# Patient Record
Sex: Female | Born: 1940 | Race: White | Hispanic: No | Marital: Married | Smoking: Never smoker
Health system: Southern US, Community
[De-identification: ages and names within clinical notes are randomized; demographics above are authoritative.]

## PROBLEM LIST (undated history)

## (undated) DIAGNOSIS — C4490 Unspecified malignant neoplasm of skin, unspecified: Secondary | ICD-10-CM

## (undated) DIAGNOSIS — H3530 Unspecified macular degeneration: Secondary | ICD-10-CM

## (undated) DIAGNOSIS — I10 Essential (primary) hypertension: Secondary | ICD-10-CM

## (undated) DIAGNOSIS — M1990 Unspecified osteoarthritis, unspecified site: Secondary | ICD-10-CM

## (undated) HISTORY — PX: ABDOMINAL HYSTERECTOMY: SHX81

## (undated) HISTORY — PX: COLONOSCOPY: SHX174

## (undated) HISTORY — PX: WISDOM TOOTH EXTRACTION: SHX21

## (undated) HISTORY — PX: BREAST BIOPSY: SHX20

---

## 2004-07-24 ENCOUNTER — Ambulatory Visit: Payer: Self-pay | Admitting: Unknown Physician Specialty

## 2006-04-08 ENCOUNTER — Ambulatory Visit: Payer: Self-pay | Admitting: Unknown Physician Specialty

## 2013-09-14 ENCOUNTER — Ambulatory Visit: Payer: Self-pay | Admitting: Unknown Physician Specialty

## 2013-09-17 LAB — PATHOLOGY REPORT

## 2014-07-23 ENCOUNTER — Encounter: Payer: Self-pay | Admitting: *Deleted

## 2014-07-30 ENCOUNTER — Ambulatory Visit: Payer: Medicare PPO | Admitting: Anesthesiology

## 2014-07-30 ENCOUNTER — Encounter
Admission: RE | Disposition: A | Discharge status: Home / Self Care | Payer: Self-pay | Source: Ambulatory Visit | Attending: Ophthalmology

## 2014-07-30 ENCOUNTER — Ambulatory Visit
Admission: RE | Admit: 2014-07-30 | Discharge: 2014-07-30 | Disposition: A | Discharge status: Home / Self Care | Payer: Medicare PPO | Source: Ambulatory Visit | Attending: Ophthalmology | Admitting: Ophthalmology

## 2014-07-30 DIAGNOSIS — H02832 Dermatochalasis of right lower eyelid: Secondary | ICD-10-CM | POA: Insufficient documentation

## 2014-07-30 DIAGNOSIS — Z85828 Personal history of other malignant neoplasm of skin: Secondary | ICD-10-CM | POA: Diagnosis not present

## 2014-07-30 DIAGNOSIS — I10 Essential (primary) hypertension: Secondary | ICD-10-CM | POA: Insufficient documentation

## 2014-07-30 DIAGNOSIS — H02834 Dermatochalasis of left upper eyelid: Secondary | ICD-10-CM | POA: Diagnosis not present

## 2014-07-30 DIAGNOSIS — Z90710 Acquired absence of both cervix and uterus: Secondary | ICD-10-CM | POA: Diagnosis not present

## 2014-07-30 DIAGNOSIS — H02831 Dermatochalasis of right upper eyelid: Secondary | ICD-10-CM | POA: Insufficient documentation

## 2014-07-30 DIAGNOSIS — Z885 Allergy status to narcotic agent status: Secondary | ICD-10-CM | POA: Diagnosis not present

## 2014-07-30 DIAGNOSIS — H02835 Dermatochalasis of left lower eyelid: Secondary | ICD-10-CM | POA: Insufficient documentation

## 2014-07-30 DIAGNOSIS — H018 Other specified inflammations of eyelid: Secondary | ICD-10-CM | POA: Insufficient documentation

## 2014-07-30 HISTORY — DX: Unspecified osteoarthritis, unspecified site: M19.90

## 2014-07-30 HISTORY — DX: Unspecified malignant neoplasm of skin, unspecified: C44.90

## 2014-07-30 HISTORY — DX: Essential (primary) hypertension: I10

## 2014-07-30 HISTORY — PX: BROW LIFT: SHX178

## 2014-07-30 HISTORY — DX: Unspecified macular degeneration: H35.30

## 2014-07-30 SURGERY — BLEPHAROPLASTY
Anesthesia: Monitor Anesthesia Care | Site: Eye | Laterality: Bilateral | Wound class: Clean

## 2014-07-30 MED ORDER — BACITRACIN 500 UNIT/GM OP OINT: TOPICAL_OINTMENT | OPHTHALMIC | Status: DC

## 2014-07-30 MED ORDER — ERYTHROMYCIN 5 MG/GM OP OINT
TOPICAL_OINTMENT | OPHTHALMIC | Status: DC | PRN
  Administered 2014-07-30: 1 via OPHTHALMIC

## 2014-07-30 MED ORDER — BSS IO SOLN
INTRAOCULAR | Status: DC | PRN
  Administered 2014-07-30: 15 mL via INTRAOCULAR

## 2014-07-30 MED ORDER — POVIDONE-IODINE 10 % EX OINT
TOPICAL_OINTMENT | CUTANEOUS | Status: DC | PRN
  Administered 2014-07-30: 1 via TOPICAL

## 2014-07-30 MED ORDER — TETRACAINE HCL 0.5 % OP SOLN
OPHTHALMIC | Status: DC | PRN
  Administered 2014-07-30: 2 [drp] via OPHTHALMIC

## 2014-07-30 MED ORDER — ACETAMINOPHEN 160 MG/5ML PO SOLN: 325.0000 mg | ORAL | Status: DC | PRN

## 2014-07-30 MED ORDER — LACTATED RINGERS IV SOLN
INTRAVENOUS | Status: DC
  Administered 2014-07-30 (×2): via INTRAVENOUS

## 2014-07-30 MED ORDER — PROPOFOL INFUSION 10 MG/ML OPTIME
INTRAVENOUS | Status: DC | PRN
  Administered 2014-07-30: 25 ug/kg/min via INTRAVENOUS

## 2014-07-30 MED ORDER — STERILE WATER FOR IRRIGATION IR SOLN
Status: DC | PRN
  Administered 2014-07-30: 1

## 2014-07-30 MED ORDER — ACETAMINOPHEN 325 MG PO TABS: 325.0000 mg | ORAL_TABLET | ORAL | Status: DC | PRN

## 2014-07-30 MED ORDER — NEOMYCIN-POLYMYXIN-DEXAMETH 3.5-10000-0.1 OP SUSP: OPHTHALMIC | Status: DC

## 2014-07-30 MED ORDER — ALFENTANIL 500 MCG/ML IJ INJ
INJECTION | INTRAMUSCULAR | Status: DC | PRN
  Administered 2014-07-30 (×2): 500 ug via INTRAVENOUS

## 2014-07-30 MED ORDER — OXYCODONE-ACETAMINOPHEN 5-325 MG PO TABS: 1.0000 | ORAL_TABLET | ORAL | Status: DC | PRN

## 2014-07-30 MED ORDER — LIDOCAINE-EPINEPHRINE 2 %-1:100000 IJ SOLN
INTRAMUSCULAR | Status: DC | PRN
  Administered 2014-07-30: 7.5 mL via OPHTHALMIC
  Administered 2014-07-30: 2 mL via OPHTHALMIC

## 2014-07-30 MED ORDER — MIDAZOLAM HCL 2 MG/2ML IJ SOLN
INTRAMUSCULAR | Status: DC | PRN
  Administered 2014-07-30: 2 mg via INTRAVENOUS

## 2014-07-30 SURGICAL SUPPLY — 34 items
BLADE SURG 15 STRL LF DISP TIS (BLADE) ×1 IMPLANT
BLADE SURG 15 STRL SS (BLADE) ×2
CORD BIP STRL DISP 12FT (MISCELLANEOUS) ×3 IMPLANT
DRAPE HEAD BAR (DRAPES) ×3 IMPLANT
GAUZE SPONGE 4X4 12PLY STRL (GAUZE/BANDAGES/DRESSINGS) ×3 IMPLANT
GAUZE SPONGE NON-WVN 2X2 STRL (MISCELLANEOUS) ×10 IMPLANT
GLOVE SURG LX 7.0 MICRO (GLOVE) ×4
GLOVE SURG LX STRL 7.0 MICRO (GLOVE) ×2 IMPLANT
MARKER SKIN XFINE TIP W/RULER (MISCELLANEOUS) ×3 IMPLANT
NEEDLE FILTER BLUNT 18X 1/2SAF (NEEDLE) ×2
NEEDLE FILTER BLUNT 18X1 1/2 (NEEDLE) ×1 IMPLANT
NEEDLE HYPO 30X.5 LL (NEEDLE) ×6 IMPLANT
PACK DRAPE NASAL/ENT (PACKS) ×3 IMPLANT
SOL PREP PVP 2OZ (MISCELLANEOUS) ×3
SOLUTION PREP PVP 2OZ (MISCELLANEOUS) ×1 IMPLANT
SPONGE VERSALON 2X2 STRL (MISCELLANEOUS) ×20
SUT CHROMIC 4-0 (SUTURE)
SUT CHROMIC 4-0 M2 12X2 ARM (SUTURE)
SUT CHROMIC 5 0 P 3 (SUTURE) IMPLANT
SUT ETHILON 4 0 CL P 3 (SUTURE) IMPLANT
SUT MERSILENE 4-0 S-2 (SUTURE) ×3 IMPLANT
SUT PLAIN GUT (SUTURE) ×12 IMPLANT
SUT PROLENE 5 0 P 3 (SUTURE) IMPLANT
SUT PROLENE 6 0 P 1 18 (SUTURE) IMPLANT
SUT SILK 4 0 G 3 (SUTURE) ×3 IMPLANT
SUT VIC AB 5-0 P-3 18X BRD (SUTURE) IMPLANT
SUT VIC AB 5-0 P3 18 (SUTURE)
SUT VICRYL 6-0  S14 CTD (SUTURE)
SUT VICRYL 6-0 S14 CTD (SUTURE) IMPLANT
SUT VICRYL 7 0 TG140 8 (SUTURE) IMPLANT
SUTURE CHRMC 4-0 M2 12X2 ARM (SUTURE) IMPLANT
SYR 3ML LL SCALE MARK (SYRINGE) ×3 IMPLANT
SYRINGE 10CC LL (SYRINGE) ×3 IMPLANT
WATER STERILE IRR 500ML POUR (IV SOLUTION) ×3 IMPLANT

## 2014-07-30 NOTE — Discharge Instructions (Signed)
General Anesthesia, Care After Refer to this sheet in the next few weeks. These instructions provide you with information on caring for yourself after your procedure. Your health care provider may also give you more specific instructions. Your treatment has been planned according to current medical practices, but problems sometimes occur. Call your health care provider if you have any problems or questions after your procedure. WHAT TO EXPECT AFTER THE PROCEDURE After the procedure, it is typical to experience:  Sleepiness.  Nausea and vomiting. HOME CARE INSTRUCTIONS  For the first 24 hours after general anesthesia:  Have a responsible person with you.  Do not drive a car. If you are alone, do not take public transportation.  Do not drink alcohol.  Do not take medicine that has not been prescribed by your health care provider.  Do not sign important papers or make important decisions.  You may resume a normal diet and activities as directed by your health care provider.  Change bandages (dressings) as directed.  If you have questions or problems that seem related to general anesthesia, call the hospital and ask for the anesthetist or anesthesiologist on call. SEEK MEDICAL CARE IF:  You have nausea and vomiting that continue the day after anesthesia.  You develop a rash. SEEK IMMEDIATE MEDICAL CARE IF:   You have difficulty breathing.  You have chest pain.  You have any allergic problems. Document Released: 05/17/2000 Document Revised: 02/13/2013 Document Reviewed: 08/24/2012 [facility] Patient Information 2015 [place], [place]. This information is not intended to replace advice given to you by your health care provider. Make sure you discuss any questions you have with your health care provider. INSTRUCTIONS FOLLOWING [facility] AMY Dennie Maizes, MD  AFTER YOUR EYE SURGERY, THER ARE MANY THINGS [place] YOU, THE PATIENT, CAN DO TO ASSURE THE BEST POSSIBLE RESULT FROM  YOUR OPERATION.  THIS SHEET SHOULD BE REFERRED TO WHENEVER QUESTIONS ARISE.  IF THERE ARE ANY QUESTIONS NOT ANSWERED HERE, DO NOT HESITATE TO CALL OUR OFFICE AT [phone] OR [phone].  THERE IS ALWAYS [facility] AVAILABLE TO CALL IF QUESTIONS OR PROBLEMS ARISE.  VISION: Your vision may be blurred and out of focus after surgery until you are able to stop using your ointment, swelling resolves and your eye(s) heal. This may take 1 to 2 weeks at the least.  If your vision becomes gradually more dim or dark, this is not normal and you need to call our office immediately.  EYE CARE: For the first 48 hours after surgery, use ice packs frequently - 20 minutes on, 20 minutes off - to help reduce swelling and bruising.  Small bags of frozen peas or corn make good ice packs along with cloths soaked in ice water.  If you are wearing a patch or other type of dressing following surgery, keep this on for the amount of time specified by your doctor.  For the first week following surgery, you will need to treat your stitches with great care.  If is OK to shower, but take care to not allow soapy water to run into your eye(s) to help reduce changes of infection.  You may gently clean the eyelashes and around the eye(s) with cotton balls and sterile water, BUT DO NOT RUB THE STITCHES VIGOROUSLY.  Keeping your stitches moist with ointment will help promote healing with minimal scar formation.  ACTIVITY: When you leave the surgery center, you should go home, rest and be inactive.  The eye(s) may feel scratchy and keeping the eyes  closed will allow for faster healing.  The first week following surgery, avoid straining (anything making the face turn red) or lifting over 20 pounds.  Additionally, avoid bending which causes your head to go below your waist.  Using your eyes will NOT harm them, so feel free to read, watch television, use the computer, etc as desired.  Driving depends on each individual, so check with your  doctor if you have questions about driving.  MEDICATIONS:  You will be given a prescription for an ointment to use 4 times a day on your stitches.  You can use the ointment in your eyes if they feel scratchy or irritated.  If you eyelid(s) dont close completely when you sleep, put some ointment in your eyes before bedtime.  EMERGENCY: If you experience SEVERE EYE PAIN OR HEADACHE UNRELIEVED BY TYLENOL OR PERCOCET, NAUSEA OR VOMITING, WORSENING REDNESS, OR WORSENING VISION (ESPECIALLY VISION THAT WA INITIALLY BETTER) CALL [phone] OR [phone] DURING BUSINESS HOURS OR AFTER HOURS.

## 2014-07-30 NOTE — H&P (Signed)
See printed H&P

## 2014-07-30 NOTE — Transfer of Care (Signed)
Immediate Anesthesia Transfer of Care Note  Patient: Heidi Villegas  Procedure(s) Performed: Procedure(s) with comments: BLEPHAROPLASTY (Bilateral) - COSMETIC BILATERAL UPPER AND LOWER LIDS  Patient Location: PACU  Anesthesia Type: MAC  Level of Consciousness: awake, alert  and patient cooperative  Airway and Oxygen Therapy: Patient Spontanous Breathing and Patient connected to supplemental oxygen  Post-op Assessment: Post-op Vital signs reviewed, Patient's Cardiovascular Status Stable, Respiratory Function Stable, Patent Airway and No signs of Nausea or vomiting  Post-op Vital Signs: Reviewed and stable  Complications: No apparent anesthesia complications

## 2014-07-30 NOTE — Interval H&P Note (Signed)
History and Physical Interval Note:  07/30/2014 7:46 AM  Heidi Villegas  has presented today for surgery, with the diagnosis of 701.8 2.831 2.834  The various methods of treatment have been discussed with the patient and family. After consideration of risks, benefits and other options for treatment, the patient has consented to  Procedure(s) with comments: BLEPHAROPLASTY (Bilateral) - COSMETIC as a surgical intervention .  The patient's history has been reviewed, patient examined, no change in status, stable for surgery.  I have reviewed the patient's chart and labs.  Questions were answered to the patient's satisfaction.     Vickki Muff, Amy M

## 2014-07-30 NOTE — Anesthesia Preprocedure Evaluation (Signed)
Anesthesia Evaluation  Patient identified by MRN, date of birth, ID band  Reviewed: Allergy & Precautions, H&P , NPO status , Patient's Chart, lab work & pertinent test results  Airway Mallampati: II  TM Distance: >3 FB Neck ROM: full    Dental no notable dental hx.    Pulmonary    Pulmonary exam normal       Cardiovascular hypertension, Rhythm:regular Rate:Normal     Neuro/Psych    GI/Hepatic   Endo/Other    Renal/GU      Musculoskeletal   Abdominal   Peds  Hematology   Anesthesia Other Findings   Reproductive/Obstetrics                             Anesthesia Physical Anesthesia Plan  ASA: II  Anesthesia Plan: MAC   Post-op Pain Management:    Induction:   Airway Management Planned:   Additional Equipment:   Intra-op Plan:   Post-operative Plan:   Informed Consent: I have reviewed the patients History and Physical, chart, labs and discussed the procedure including the risks, benefits and alternatives for the proposed anesthesia with the patient or authorized representative who has indicated his/her understanding and acceptance.     Plan Discussed with: CRNA  Anesthesia Plan Comments:         Anesthesia Quick Evaluation

## 2014-07-30 NOTE — Op Note (Signed)
Preoperative Diagnosis:  1. Visually significant dermatochalasis both Upper Eyelid(s) 2.  Dermatochalasis and Steatoblephron both Lower Eyelid(s)  Postoperative Diagnosis:  Same.  Procedure(s) Performed:   1. Upper eyelid blepharoplasty with excess skin excision  both Upper Eyelid(s) 2. Cosmetic trans-conjunctival lower eyelid blepharoplasty both  Lower Eyelid(s)  Teaching Surgeon: Philis Pique. Vickki Muff, M.D.  Assistants: none  Anesthesia: IVA  Specimens: None.  Estimated Blood Loss: Minimal.  Complications: None.  Operative Findings: None Dictated  Procedure:   After the risks, benefits, complications and alternatives were discussed with the patient, appropriate informed consent was obtained and the patient was brought to the operating suite. The patient was reclined supine and a timeout was conducted.  Local anesthetic consisting of a 50-50 mixture of 2% lidocaine with epinephrine and 0.75% bupivacaine with Hylenex added was injected subcutaneously to both upper eyelid(s). Additional anesthetic was injected subconjunctivally to both lower lids. After adequate local was instilled, the patient was prepped and draped in the usual sterile fashion for eyelid surgery.   Attention was turned to the upper eyelids. A 9 mm upper eyelid crease incision line was marked with calipers on both upper eyelid(s).  A pinch test was used to estimate the amount of excess skin to remove and this was marked in standard blepharoplasty style fashion. Attention was turned to the  right upper eyelid. A #15 blade was used to open the premarked incision line. A skin and muscle flap was excised and hemostasis was obtained with bipolar cautery.   A buttonhole was created medially and the medial and a portion of the central fat pockets were dissected free from septal attachments, allowed to prolapse anteriorly, cauterized towards the pedicle base and excised. A small portion of the sub-brow fat pocket was also excised.  This provided nice flattening of the eyelid contour  Attention was then turned to the opposite eyelid where the same procedure was performed in the same manner. Hemostasis was obtained with bipolar cautery throughout. All incisions were then closed with a combination of running and interrupted 6-0 fast absorbing plain suture.   Attention was turned to the left lower eyelid.  The lid was placed on traction over a desmarres retractor with a 4-0 silk frost suture.  A #15 blade was used to open a trans-conjunctival incision along the inferior margin of the tarsus.  Westcott scissors were used to transect through retractors to reach the plane between orbicularis and orbital septum.  Blunt dissection was carried inferiorly in this plane. Stephens scissors were used to bluntly dissect between orbicularis and orbital septum down to the inferior orbital rim.  The orbital septum was opened and the medial, central, and lateral fat pockets were dissected free from fascial attachments, allowed to prolapse anteriorly, cauterized towards the pedicle base and excised. Because of the previous surgery she had on the left lower eyelid, the lid was very tight and the visualization was limited. Once nice flattening of the lid contour was achieved,  Attention was then turned to the opposite lid where the same procedure was performed in the same fashion.  Following the transconjunctival blepharoplasty portion on the right lower lid, a #15 blade was used to make a stab incision perpendicular to the right lateral canthus down to the lateral orbital rim. Blunt dissection was carried out over the lateral orbital rim. A double armed 4-0 Mersilene suture was then passed into the terminal portion of the inferior arm of the lateral canthal tendon. Each arm of the Mersilene was then passed through the  periosteum of the inner portion of the lateral orbital rim at the level of Whitnall's tubercle to provide elevation and tightening of the right  lower lid. Once the suture was secured the stab incision was closed with interrupted 6-0 fast-absorbing plain gut suture.  The patient tolerated the procedure well.  Bacitracin ointment was applied to Her incision sites, followed by ice packs. She was taken to the recovery area where she recovered without difficulty.  Post-Op Plan/Instructions:  The patient was instructed to use ice packs frequently for the next 48 hours. She was instructed to use  bacitracin ointment on her incisions 4 times a day for the next 12 to 14 days. She was given a prescription for Percocet for pain control should Tylenol not be effective. She was asked to to follow up in 2 weeks' time or sooner as needed for problems.  Teaching Surgeon Attestation: none  Amy M. Vickki Muff, M.D. Attending,Ophthalmology

## 2014-07-30 NOTE — Anesthesia Procedure Notes (Signed)
Procedure Name: MAC Date/Time: 07/30/2014 7:53 AM Performed by: Cameron Ali Pre-anesthesia Checklist: Patient identified, Emergency Drugs available, Suction available, Timeout performed and Patient being monitored Patient Re-evaluated:Patient Re-evaluated prior to inductionOxygen Delivery Method: Nasal cannula Placement Confirmation: positive ETCO2

## 2014-07-30 NOTE — Anesthesia Postprocedure Evaluation (Signed)
  Anesthesia Post-op Note  Patient: Heidi Villegas  Procedure(s) Performed: Procedure(s) with comments: BLEPHAROPLASTY (Bilateral) - COSMETIC BILATERAL UPPER AND LOWER LIDS  Anesthesia type:MAC  Patient location: PACU  Post pain: Pain level controlled  Post assessment: Post-op Vital signs reviewed, Patient's Cardiovascular Status Stable, Respiratory Function Stable, Patent Airway and No signs of Nausea or vomiting  Post vital signs: Reviewed and stable  Last Vitals:  Filed Vitals:   07/30/14 1014  BP: 158/92  Pulse: 91  Temp:   Resp: 19    Level of consciousness: awake, alert  and patient cooperative  Complications: No apparent anesthesia complications

## 2014-07-31 ENCOUNTER — Encounter: Payer: Self-pay | Admitting: Ophthalmology

## 2016-01-15 ENCOUNTER — Inpatient Hospital Stay
Admission: EM | Admit: 2016-01-15 | Discharge: 2016-02-23 | DRG: 870 | Disposition: E | Payer: PPO | Attending: Internal Medicine | Admitting: Internal Medicine

## 2016-01-15 ENCOUNTER — Emergency Department: Payer: PPO

## 2016-01-15 ENCOUNTER — Inpatient Hospital Stay: Payer: PPO

## 2016-01-15 DIAGNOSIS — Z882 Allergy status to sulfonamides status: Secondary | ICD-10-CM

## 2016-01-15 DIAGNOSIS — N1330 Unspecified hydronephrosis: Secondary | ICD-10-CM

## 2016-01-15 DIAGNOSIS — D696 Thrombocytopenia, unspecified: Secondary | ICD-10-CM | POA: Diagnosis not present

## 2016-01-15 DIAGNOSIS — K922 Gastrointestinal hemorrhage, unspecified: Secondary | ICD-10-CM

## 2016-01-15 DIAGNOSIS — J9600 Acute respiratory failure, unspecified whether with hypoxia or hypercapnia: Secondary | ICD-10-CM

## 2016-01-15 DIAGNOSIS — H3530 Unspecified macular degeneration: Secondary | ICD-10-CM | POA: Diagnosis present

## 2016-01-15 DIAGNOSIS — E876 Hypokalemia: Secondary | ICD-10-CM | POA: Diagnosis present

## 2016-01-15 DIAGNOSIS — E860 Dehydration: Secondary | ICD-10-CM | POA: Diagnosis present

## 2016-01-15 DIAGNOSIS — M19041 Primary osteoarthritis, right hand: Secondary | ICD-10-CM | POA: Diagnosis present

## 2016-01-15 DIAGNOSIS — R778 Other specified abnormalities of plasma proteins: Secondary | ICD-10-CM

## 2016-01-15 DIAGNOSIS — R0600 Dyspnea, unspecified: Secondary | ICD-10-CM

## 2016-01-15 DIAGNOSIS — E8770 Fluid overload, unspecified: Secondary | ICD-10-CM | POA: Diagnosis not present

## 2016-01-15 DIAGNOSIS — N170 Acute kidney failure with tubular necrosis: Secondary | ICD-10-CM | POA: Diagnosis not present

## 2016-01-15 DIAGNOSIS — E1122 Type 2 diabetes mellitus with diabetic chronic kidney disease: Secondary | ICD-10-CM | POA: Diagnosis present

## 2016-01-15 DIAGNOSIS — L8990 Pressure ulcer of unspecified site, unspecified stage: Secondary | ICD-10-CM | POA: Insufficient documentation

## 2016-01-15 DIAGNOSIS — Z01818 Encounter for other preprocedural examination: Secondary | ICD-10-CM

## 2016-01-15 DIAGNOSIS — N12 Tubulo-interstitial nephritis, not specified as acute or chronic: Secondary | ICD-10-CM

## 2016-01-15 DIAGNOSIS — I4891 Unspecified atrial fibrillation: Secondary | ICD-10-CM | POA: Diagnosis present

## 2016-01-15 DIAGNOSIS — Z7984 Long term (current) use of oral hypoglycemic drugs: Secondary | ICD-10-CM

## 2016-01-15 DIAGNOSIS — J9601 Acute respiratory failure with hypoxia: Secondary | ICD-10-CM | POA: Diagnosis present

## 2016-01-15 DIAGNOSIS — Z515 Encounter for palliative care: Secondary | ICD-10-CM | POA: Diagnosis present

## 2016-01-15 DIAGNOSIS — A419 Sepsis, unspecified organism: Secondary | ICD-10-CM | POA: Diagnosis present

## 2016-01-15 DIAGNOSIS — N390 Urinary tract infection, site not specified: Secondary | ICD-10-CM

## 2016-01-15 DIAGNOSIS — E785 Hyperlipidemia, unspecified: Secondary | ICD-10-CM | POA: Diagnosis present

## 2016-01-15 DIAGNOSIS — N179 Acute kidney failure, unspecified: Secondary | ICD-10-CM

## 2016-01-15 DIAGNOSIS — B9620 Unspecified Escherichia coli [E. coli] as the cause of diseases classified elsewhere: Secondary | ICD-10-CM | POA: Diagnosis not present

## 2016-01-15 DIAGNOSIS — Z85828 Personal history of other malignant neoplasm of skin: Secondary | ICD-10-CM

## 2016-01-15 DIAGNOSIS — D62 Acute posthemorrhagic anemia: Secondary | ICD-10-CM | POA: Diagnosis not present

## 2016-01-15 DIAGNOSIS — R000 Tachycardia, unspecified: Secondary | ICD-10-CM | POA: Diagnosis not present

## 2016-01-15 DIAGNOSIS — Z95828 Presence of other vascular implants and grafts: Secondary | ICD-10-CM

## 2016-01-15 DIAGNOSIS — R7989 Other specified abnormal findings of blood chemistry: Secondary | ICD-10-CM

## 2016-01-15 DIAGNOSIS — J9690 Respiratory failure, unspecified, unspecified whether with hypoxia or hypercapnia: Secondary | ICD-10-CM

## 2016-01-15 DIAGNOSIS — M479 Spondylosis, unspecified: Secondary | ICD-10-CM | POA: Diagnosis present

## 2016-01-15 DIAGNOSIS — N171 Acute kidney failure with acute cortical necrosis: Secondary | ICD-10-CM | POA: Diagnosis not present

## 2016-01-15 DIAGNOSIS — R0902 Hypoxemia: Secondary | ICD-10-CM

## 2016-01-15 DIAGNOSIS — N183 Chronic kidney disease, stage 3 (moderate): Secondary | ICD-10-CM | POA: Diagnosis present

## 2016-01-15 DIAGNOSIS — G9341 Metabolic encephalopathy: Secondary | ICD-10-CM | POA: Diagnosis not present

## 2016-01-15 DIAGNOSIS — Z4659 Encounter for fitting and adjustment of other gastrointestinal appliance and device: Secondary | ICD-10-CM

## 2016-01-15 DIAGNOSIS — R6521 Severe sepsis with septic shock: Secondary | ICD-10-CM | POA: Diagnosis present

## 2016-01-15 DIAGNOSIS — Z66 Do not resuscitate: Secondary | ICD-10-CM | POA: Diagnosis present

## 2016-01-15 DIAGNOSIS — J189 Pneumonia, unspecified organism: Secondary | ICD-10-CM | POA: Diagnosis present

## 2016-01-15 DIAGNOSIS — N2889 Other specified disorders of kidney and ureter: Secondary | ICD-10-CM | POA: Diagnosis present

## 2016-01-15 DIAGNOSIS — N132 Hydronephrosis with renal and ureteral calculous obstruction: Secondary | ICD-10-CM | POA: Diagnosis present

## 2016-01-15 DIAGNOSIS — Z885 Allergy status to narcotic agent status: Secondary | ICD-10-CM

## 2016-01-15 DIAGNOSIS — R197 Diarrhea, unspecified: Secondary | ICD-10-CM | POA: Diagnosis not present

## 2016-01-15 DIAGNOSIS — I248 Other forms of acute ischemic heart disease: Secondary | ICD-10-CM | POA: Diagnosis present

## 2016-01-15 DIAGNOSIS — D638 Anemia in other chronic diseases classified elsewhere: Secondary | ICD-10-CM | POA: Diagnosis not present

## 2016-01-15 DIAGNOSIS — Z79899 Other long term (current) drug therapy: Secondary | ICD-10-CM

## 2016-01-15 DIAGNOSIS — E874 Mixed disorder of acid-base balance: Secondary | ICD-10-CM | POA: Diagnosis not present

## 2016-01-15 DIAGNOSIS — Z7982 Long term (current) use of aspirin: Secondary | ICD-10-CM

## 2016-01-15 DIAGNOSIS — R140 Abdominal distension (gaseous): Secondary | ICD-10-CM | POA: Diagnosis not present

## 2016-01-15 DIAGNOSIS — I129 Hypertensive chronic kidney disease with stage 1 through stage 4 chronic kidney disease, or unspecified chronic kidney disease: Secondary | ICD-10-CM | POA: Diagnosis present

## 2016-01-15 LAB — CBC WITH DIFFERENTIAL/PLATELET
BAND NEUTROPHILS: 2 %
BASOS ABS: 0 10*3/uL (ref 0–0.1)
BLASTS: 0 %
Basophils Relative: 0 %
EOS ABS: 0.3 10*3/uL (ref 0–0.7)
Eosinophils Relative: 1 %
HCT: 41.4 % (ref 35.0–47.0)
HEMOGLOBIN: 14.1 g/dL (ref 12.0–16.0)
Lymphocytes Relative: 4 %
Lymphs Abs: 1.2 10*3/uL (ref 1.0–3.6)
MCH: 28.8 pg (ref 26.0–34.0)
MCHC: 34.2 g/dL (ref 32.0–36.0)
MCV: 84.3 fL (ref 80.0–100.0)
METAMYELOCYTES PCT: 0 %
MONOS PCT: 5 %
MYELOCYTES: 0 %
Monocytes Absolute: 1.5 10*3/uL — ABNORMAL HIGH (ref 0.2–0.9)
NEUTROS ABS: 27.8 10*3/uL — AB (ref 1.4–6.5)
Neutrophils Relative %: 88 %
Other: 0 %
PLATELETS: 99 10*3/uL — AB (ref 150–440)
PROMYELOCYTES ABS: 0 %
RBC: 4.9 MIL/uL (ref 3.80–5.20)
RDW: 15.2 % — ABNORMAL HIGH (ref 11.5–14.5)
SMEAR REVIEW: ADEQUATE
WBC: 30.8 10*3/uL — AB (ref 3.6–11.0)
nRBC: 0 /100 WBC

## 2016-01-15 LAB — URINALYSIS COMPLETE WITH MICROSCOPIC (ARMC ONLY)
Bilirubin Urine: NEGATIVE
Glucose, UA: NEGATIVE mg/dL
Ketones, ur: NEGATIVE mg/dL
Nitrite: NEGATIVE
Protein, ur: 30 mg/dL — AB
Specific Gravity, Urine: 1.011 (ref 1.005–1.030)
pH: 5 (ref 5.0–8.0)

## 2016-01-15 LAB — COMPREHENSIVE METABOLIC PANEL
ALBUMIN: 2.2 g/dL — AB (ref 3.5–5.0)
ALK PHOS: 251 U/L — AB (ref 38–126)
ALT: 72 U/L — ABNORMAL HIGH (ref 14–54)
ANION GAP: 17 — AB (ref 5–15)
AST: 88 U/L — ABNORMAL HIGH (ref 15–41)
BILIRUBIN TOTAL: 2.7 mg/dL — AB (ref 0.3–1.2)
BUN: 101 mg/dL — ABNORMAL HIGH (ref 6–20)
CALCIUM: 8.6 mg/dL — AB (ref 8.9–10.3)
CO2: 19 mmol/L — ABNORMAL LOW (ref 22–32)
Chloride: 96 mmol/L — ABNORMAL LOW (ref 101–111)
Creatinine, Ser: 3.29 mg/dL — ABNORMAL HIGH (ref 0.44–1.00)
GFR, EST AFRICAN AMERICAN: 15 mL/min — AB (ref >60)
GFR, EST NON AFRICAN AMERICAN: 13 mL/min — AB (ref >60)
GLUCOSE: 155 mg/dL — AB (ref 65–99)
POTASSIUM: 2.4 mmol/L — AB (ref 3.5–5.1)
Sodium: 132 mmol/L — ABNORMAL LOW (ref 135–145)
TOTAL PROTEIN: 6.3 g/dL — AB (ref 6.5–8.1)

## 2016-01-15 LAB — LACTIC ACID, PLASMA
LACTIC ACID, VENOUS: 10.9 mmol/L — AB (ref 0.5–1.9)
LACTIC ACID, VENOUS: 2.1 mmol/L — AB (ref 0.5–1.9)
Lactic Acid, Venous: 3.8 mmol/L (ref 0.5–1.9)
Lactic Acid, Venous: 3.5 mmol/L (ref 0.5–1.9)

## 2016-01-15 LAB — POTASSIUM: POTASSIUM: 4.4 mmol/L (ref 3.5–5.1)

## 2016-01-15 LAB — TROPONIN I
Troponin I: 0.17 ng/mL (ref <0.03)
Troponin I: 0.14 ng/mL (ref <0.03)

## 2016-01-15 LAB — GLUCOSE, CAPILLARY
GLUCOSE-CAPILLARY: 175 mg/dL — AB (ref 65–99)
Glucose-Capillary: 201 mg/dL — ABNORMAL HIGH (ref 65–99)
Glucose-Capillary: 202 mg/dL — ABNORMAL HIGH (ref 65–99)

## 2016-01-15 LAB — MAGNESIUM: Magnesium: 2.5 mg/dL — ABNORMAL HIGH (ref 1.7–2.4)

## 2016-01-15 LAB — MRSA PCR SCREENING: MRSA by PCR: NEGATIVE

## 2016-01-15 MED ORDER — LACTATED RINGERS IV BOLUS (SEPSIS)
1000.0000 mL | Freq: Once | INTRAVENOUS | Status: AC
  Administered 2016-01-15: 1000 mL via INTRAVENOUS

## 2016-01-15 MED ORDER — PIPERACILLIN-TAZOBACTAM 3.375 G IVPB 30 MIN
3.3750 g | Freq: Two times a day (BID) | INTRAVENOUS | Status: DC
  Filled 2016-01-15 (×2): qty 50

## 2016-01-15 MED ORDER — HEPARIN SODIUM (PORCINE) 5000 UNIT/ML IJ SOLN
5000.0000 [IU] | Freq: Three times a day (TID) | INTRAMUSCULAR | Status: DC
  Administered 2016-01-15 – 2016-01-23 (×23): 5000 [IU] via SUBCUTANEOUS
  Filled 2016-01-15 (×22): qty 1

## 2016-01-15 MED ORDER — SODIUM CHLORIDE 0.9 % IV SOLN
Freq: Once | INTRAVENOUS | Status: AC
  Administered 2016-01-15: 15:00:00 via INTRAVENOUS

## 2016-01-15 MED ORDER — INSULIN ASPART 100 UNIT/ML ~~LOC~~ SOLN
0.0000 [IU] | Freq: Three times a day (TID) | SUBCUTANEOUS | Status: DC
  Administered 2016-01-15: 3 [IU] via SUBCUTANEOUS
  Administered 2016-01-16 (×2): 2 [IU] via SUBCUTANEOUS
  Administered 2016-01-16: 3 [IU] via SUBCUTANEOUS
  Administered 2016-01-17: 2 [IU] via SUBCUTANEOUS
  Administered 2016-01-17 (×2): 3 [IU] via SUBCUTANEOUS
  Administered 2016-01-18 (×2): 7 [IU] via SUBCUTANEOUS
  Administered 2016-01-18: 5 [IU] via SUBCUTANEOUS
  Filled 2016-01-15: qty 3
  Filled 2016-01-15: qty 5
  Filled 2016-01-15: qty 3
  Filled 2016-01-15 (×2): qty 7
  Filled 2016-01-15 (×2): qty 2
  Filled 2016-01-15 (×3): qty 3

## 2016-01-15 MED ORDER — TRAMADOL HCL 50 MG PO TABS: 50.0000 mg | ORAL_TABLET | Freq: Two times a day (BID) | ORAL | Status: DC | PRN

## 2016-01-15 MED ORDER — ENOXAPARIN SODIUM 30 MG/0.3ML ~~LOC~~ SOLN: 30.0000 mg | SUBCUTANEOUS | Status: DC

## 2016-01-15 MED ORDER — B COMPLEX-C PO TABS
1.0000 | ORAL_TABLET | Freq: Every day | ORAL | Status: DC
  Administered 2016-01-16 – 2016-01-28 (×10): 1 via ORAL
  Filled 2016-01-15 (×13): qty 1

## 2016-01-15 MED ORDER — ASPIRIN 81 MG PO CHEW
324.0000 mg | CHEWABLE_TABLET | Freq: Once | ORAL | Status: AC
  Administered 2016-01-15: 324 mg via ORAL
  Filled 2016-01-15: qty 4

## 2016-01-15 MED ORDER — ACETAMINOPHEN 500 MG PO TABS
500.0000 mg | ORAL_TABLET | Freq: Four times a day (QID) | ORAL | Status: DC | PRN
Start: 2016-01-15 — End: 2016-01-28
  Administered 2016-01-16: 500 mg via ORAL
  Filled 2016-01-15: qty 1

## 2016-01-15 MED ORDER — PIPERACILLIN-TAZOBACTAM 3.375 G IVPB
3.3750 g | Freq: Two times a day (BID) | INTRAVENOUS | Status: DC
  Administered 2016-01-15: 3.375 g via INTRAVENOUS
  Filled 2016-01-15: qty 50

## 2016-01-15 MED ORDER — ASPIRIN EC 81 MG PO TBEC
81.0000 mg | DELAYED_RELEASE_TABLET | Freq: Every day | ORAL | Status: DC
  Administered 2016-01-16 – 2016-01-19 (×2): 81 mg via ORAL
  Filled 2016-01-15 (×3): qty 1

## 2016-01-15 MED ORDER — SODIUM CHLORIDE 0.9 % IV BOLUS (SEPSIS)
1000.0000 mL | Freq: Once | INTRAVENOUS | Status: AC
  Administered 2016-01-15: 1000 mL via INTRAVENOUS

## 2016-01-15 MED ORDER — VANCOMYCIN HCL IN DEXTROSE 1-5 GM/200ML-% IV SOLN
1000.0000 mg | INTRAVENOUS | Status: DC
  Administered 2016-01-15: 1000 mg via INTRAVENOUS
  Filled 2016-01-15: qty 200

## 2016-01-15 MED ORDER — INSULIN ASPART 100 UNIT/ML ~~LOC~~ SOLN
0.0000 [IU] | Freq: Every day | SUBCUTANEOUS | Status: DC
  Administered 2016-01-15 – 2016-01-17 (×3): 2 [IU] via SUBCUTANEOUS
  Filled 2016-01-15 (×3): qty 2

## 2016-01-15 MED ORDER — SODIUM CHLORIDE 0.9 % IV SOLN
INTRAVENOUS | Status: AC
  Administered 2016-01-15: 17:00:00 via INTRAVENOUS
  Administered 2016-01-16: 1000 mL via INTRAVENOUS

## 2016-01-15 MED ORDER — SODIUM CHLORIDE 0.9 % IV SOLN
30.0000 meq | Freq: Once | INTRAVENOUS | Status: DC
  Filled 2016-01-15: qty 15

## 2016-01-15 MED ORDER — POTASSIUM CHLORIDE CRYS ER 20 MEQ PO TBCR
20.0000 meq | EXTENDED_RELEASE_TABLET | Freq: Every day | ORAL | Status: DC
  Administered 2016-01-15 – 2016-01-19 (×3): 20 meq via ORAL
  Filled 2016-01-15 (×4): qty 1

## 2016-01-15 MED ORDER — FAMOTIDINE IN NACL 20-0.9 MG/50ML-% IV SOLN
20.0000 mg | INTRAVENOUS | Status: DC
  Administered 2016-01-17 – 2016-01-23 (×4): 20 mg via INTRAVENOUS
  Filled 2016-01-15 (×4): qty 50

## 2016-01-15 MED ORDER — POTASSIUM CHLORIDE 20 MEQ/15ML (10%) PO SOLN
40.0000 meq | Freq: Once | ORAL | Status: AC
  Administered 2016-01-15: 40 meq via ORAL
  Filled 2016-01-15 (×2): qty 30

## 2016-01-15 MED ORDER — ONDANSETRON HCL 4 MG PO TABS: 4.0000 mg | ORAL_TABLET | Freq: Four times a day (QID) | ORAL | Status: DC | PRN

## 2016-01-15 MED ORDER — PIPERACILLIN-TAZOBACTAM 3.375 G IVPB 30 MIN
3.3750 g | Freq: Once | INTRAVENOUS | Status: AC
  Administered 2016-01-15: 3.375 g via INTRAVENOUS
  Filled 2016-01-15: qty 50

## 2016-01-15 MED ORDER — VITAMIN D 1000 UNITS PO TABS
1000.0000 [IU] | ORAL_TABLET | Freq: Every day | ORAL | Status: DC
  Administered 2016-01-16 – 2016-01-28 (×10): 1000 [IU] via ORAL
  Filled 2016-01-15 (×15): qty 1

## 2016-01-15 MED ORDER — VANCOMYCIN HCL IN DEXTROSE 1-5 GM/200ML-% IV SOLN
1000.0000 mg | Freq: Once | INTRAVENOUS | Status: AC
  Administered 2016-01-15: 1000 mg via INTRAVENOUS
  Filled 2016-01-15: qty 200

## 2016-01-15 MED ORDER — SODIUM CHLORIDE 0.9 % IV SOLN
30.0000 meq | Freq: Once | INTRAVENOUS | Status: AC
  Administered 2016-01-15: 30 meq via INTRAVENOUS
  Filled 2016-01-15: qty 15

## 2016-01-15 MED ORDER — ONDANSETRON HCL 4 MG/2ML IJ SOLN: 4.0000 mg | Freq: Four times a day (QID) | INTRAMUSCULAR | Status: DC | PRN

## 2016-01-15 MED ORDER — ATORVASTATIN CALCIUM 20 MG PO TABS
80.0000 mg | ORAL_TABLET | Freq: Every evening | ORAL | Status: DC
  Administered 2016-01-15 – 2016-01-27 (×11): 80 mg via ORAL
  Filled 2016-01-15 (×11): qty 4

## 2016-01-15 MED ORDER — SODIUM CHLORIDE 0.9% FLUSH
3.0000 mL | Freq: Two times a day (BID) | INTRAVENOUS | Status: DC
  Administered 2016-01-15 – 2016-01-17 (×4): 3 mL via INTRAVENOUS

## 2016-01-15 NOTE — Progress Notes (Signed)
MD notified of blood cultures collections after IV ABT initiated. Repeat lactic acid orders placed. Patient had x1 incontinent episode of urine. Bladder scan s/p incontinent episode was 300cc. MD request to continue to monitor. Patient reports feeling much better.

## 2016-01-15 NOTE — Progress Notes (Signed)
Famotidine renally adjusted from daily to q48 hours.  Larene Beach, PharmD

## 2016-01-15 NOTE — H&P (Signed)
[facility] [place] NAME: Heidi Villegas    MR#:  [id number]  DATE OF BIRTH:  [birth year]  DATE OF ADMISSION:  01/22/2016  PRIMARY CARE PHYSICIAN: Gayland Curry, MD   REQUESTING/REFERRING PHYSICIAN:Dr. Reita Cliche CHIEF COMPLAINT:  Altered mental status  HISTORY OF PRESENT ILLNESS:  Heidi Villegas  is a 75 y.o. female with a known history of Diabetes mellitus on metformin, hypertension, chronic low back pain has been sick for the last 1 week according to the husband and daughter. Patient refused coming to the hospital to get evaluated. She has been coughing and been short of breath. Patient is very confused today which prompted her husband and daughter to bring her into the hospital. Urinalysis is abnormal Patient was hypotensive and white count was elevated at 30,000. Troponin is elevated and potassium at 2.4. Cultures were obtained and patient is started on broad-spectrum IV antibiotics and hospitalist team called. Aggressive hydration was provided by the ED physician and during my examination patient started perking up, answering few questions  PAST MEDICAL HISTORY:   Past Medical History:  Diagnosis Date  . Arthritis    right hand, lower back  . Hypertension   . Macular degeneration   . Skin cancer     PAST SURGICAL HISTOIRY:   Past Surgical History:  Procedure Laterality Date  . ABDOMINAL HYSTERECTOMY    . BREAST BIOPSY    . BROW LIFT Bilateral 07/30/2014   Procedure: BLEPHAROPLASTY;  Surgeon: Karle Starch, MD;  Location: [facility];  Service: Ophthalmology;  Laterality: Bilateral;  COSMETIC BILATERAL UPPER AND LOWER LIDS  . COLONOSCOPY    . WISDOM TOOTH EXTRACTION      SOCIAL HISTORY:   Social History  Substance Use Topics  . Smoking status: Never Smoker  . Smokeless tobacco: Never Used  . Alcohol use Yes     Comment: wine 1 glass/month    FAMILY HISTORY:  No family history on file.  DRUG ALLERGIES:    Allergies  Allergen Reactions  . Codeine Nausea And Vomiting  . Sulfa Antibiotics     REVIEW OF SYSTEMS:  Limited review of system   CONSTITUTIONAL: No fever, fatigue , Reporting weakness.  EYES: No blurred or double vision.  EARS, NOSE, AND THROAT: No tinnitus or ear pain.  RESPIRATORY: Admits cough, shortness of breath, denies wheezing or hemoptysis.  CARDIOVASCULAR: No chest pain, orthopnea, edema.  GASTROINTESTINAL: No nausea, vomiting, diarrhea or abdominal pain.  GENITOURINARY: No dysuria, hematuria.  NEUROLOGIC: No tingling, numbness, weakness.  PSYCHIATRY: No anxiety or depression.   MEDICATIONS AT HOME:   Prior to Admission medications   Medication Sig Start Date End Date Taking? Authorizing Provider  acetaminophen (TYLENOL) 500 MG tablet Take 500 mg by mouth every 6 (six) hours as needed.   Yes Historical Provider, MD  amLODipine (NORVASC) 2.5 MG tablet Take 2.5 mg by mouth daily. AM   Yes Historical Provider, MD  aspirin 81 MG tablet Take 81 mg by mouth daily. AM   Yes Historical Provider, MD  atorvastatin (LIPITOR) 80 MG tablet Take 1 tablet by mouth daily. 12/11/15  Yes Historical Provider, MD  b complex vitamins tablet Take 1 tablet by mouth daily. AM   Yes Historical Provider, MD  Cholecalciferol (VITAMIN D-3 PO) Take 1 tablet by mouth daily. AM    Yes Historical Provider, MD  CINNAMON PO Take by mouth. AM   Yes Historical Provider, MD  metFORMIN (GLUCOPHAGE-XR) 500 MG 24 hr tablet Take  1 tablet by mouth daily. 12/11/15  Yes Historical Provider, MD  Multiple Vitamins-Minerals (CENTRUM SILVER PO) Take by mouth. AM   Yes Historical Provider, MD  Multiple Vitamins-Minerals (PRESERVISION AREDS PO) Take by mouth. AM   Yes Historical Provider, MD  Omega-3 Fatty Acids (OMEGA 3 PO) Take by mouth. AM   Yes Historical Provider, MD  potassium chloride SA (K-DUR,KLOR-CON) 20 MEQ tablet Take 1 tablet by mouth daily. 12/11/15  Yes Historical Provider, MD  TURMERIC PO Take by  mouth.   Yes Historical Provider, MD      VITAL SIGNS:  Blood pressure 101/77, pulse (!) 115, temperature 98.8 F (37.1 C), temperature source Oral, resp. rate (!) 29, height 5\' 1"  (1.549 m), weight 90.7 kg (200 lb), SpO2 92 %.  PHYSICAL EXAMINATION:  GENERAL:  75 y.o.-year-old patient lying in the bed with no acute distress.  EYES: Pupils equal, round, reactive to light and accommodation. No scleral icterus. Extraocular muscles intact.  HEENT: Head atraumatic, normocephalic. Oropharynx and nasopharynx clear.  NECK:  Supple, no jugular venous distention. No thyroid enlargement, no tenderness.  LUNGS: Diminished breath sounds bilaterally, no wheezing, rales,rhonchi or crepitation. No use of accessory muscles of respiration.  CARDIOVASCULAR: S1, S2 normal. No murmurs, rubs, or gallops.  ABDOMEN: Soft, nontender, nondistended. Bowel sounds present. No organomegaly or mass. Flank tenderness is present EXTREMITIES: No pedal edema, cyanosis, or clubbing.  NEUROLOGIC: Cranial nerves II through XII are intact. Muscle strength 5/5 in all extremities. Sensation intact. Gait not checked.  PSYCHIATRIC: The patient is alert and oriented x 3.  SKIN: No obvious rash, lesion, or ulcer.   LABORATORY PANEL:   CBC  Recent Labs Lab 01/13/2016 1329  WBC 30.8*  HGB 14.1  HCT 41.4  PLT 99*   ------------------------------------------------------------------------------------------------------------------  Chemistries   Recent Labs Lab 01/02/2016 1329  NA 132*  K 2.4*  CL 96*  CO2 19*  GLUCOSE 155*  BUN 101*  CREATININE 3.29*  CALCIUM 8.6*  AST 88*  ALT 72*  ALKPHOS 251*  BILITOT 2.7*   ------------------------------------------------------------------------------------------------------------------  Cardiac Enzymes  Recent Labs Lab 12/30/2015 1329  TROPONINI 0.17*    ------------------------------------------------------------------------------------------------------------------  RADIOLOGY:  Dg Chest Port 1 View  Result Date: 01/11/2016 CLINICAL DATA:  Altered mental status and tachypnea. EXAM: PORTABLE CHEST 1 VIEW COMPARISON:  None. FINDINGS: Cardiomegaly and pulmonary vascular congestion noted. Elevation of the right hemidiaphragm is present. Mild left lower lung subsegmental atelectasis/ scarring identified. There is no evidence of pneumothorax or definite pleural effusion. No acute bony abnormalities are identified. IMPRESSION: Cardiomegaly with pulmonary vascular congestion. Mild left lower lung subsegmental atelectasis/ scarring. Electronically Signed   By: Margarette Canada M.D.   On: 12/25/2015 14:14    EKG:  No orders found for this or any previous visit.  IMPRESSION AND PLAN:   Heidi Villegas  is a 75 y.o. female with a known history of Diabetes mellitus on metformin, hypertension, chronic low back pain has been sick for the last 1 week according to the husband and daughter. Patient refused coming to the hospital to get evaluated. She has been coughing and been short of breath. Patient is very confused today which prompted her husband and daughter to bring her into the hospital. Urinalysis is abnormal Patient was hypotensive and white count was elevated at 30,000. Troponin is elevated  #Septic shock-meets criteria with severe hypotension, leukocytosis, sinus tachycardia, elevated lactic acid and altered mental status-secondary to acute pyelonephritis   continue aggressive hydration with IV fluids with close monitoring for  fluid overload   blood cultures and urine cultures were obtained   will get sputum culture and sensitivity as patient is reporting cough Continue broad-spectrum IV antibiotics Zosyn and vancomycin Check lactic acid level  #Acute kidney injury prerenal from dehydration from decreased by mouth intake secondary to sepsis Provide  hydration with IV fluids Avoid nephrotoxins, holding metformin at this time Foley catheter Renal ultrasound  #. Hypokalemia with potassium 2.4 Replete potassium IV and by mouth Check potassium and magnesium levels  #Elevated troponin-could be from demand ischemia Initial troponin at 0.17 Patient denies any chest pain continue close monitoring  cycle cardiac biomarkers  #Diabetes mellitus Sliding scale insulin and hold metformin    All the records are reviewed and case discussed with ED provider. Management plans discussed with the patient, family and they are in agreement.  CODE STATUS: full code, husband is the healthcare power of attorney  TOTAL CRITICAL CARE TIME TAKING CARE OF THIS PATIENT:  81minutes.   Note: This dictation was prepared with Dragon dictation along with smaller phrase technology. Any transcriptional errors that result from this process are unintentional.  Nicholes Mango M.D on 01/05/2016 at 4:22 PM  Between 7am to 6pm - Pager - [phone]  After 6pm go to [email or web address] - password [email or web address] [facility]  Office  [phone]  CC: Primary care physician; Gayland Curry, MD

## 2016-01-15 NOTE — ED Notes (Addendum)
Reported critical lactic to admitting MD Gouru and Dr Reita Cliche - admitting MD questions validity of test - pt seems more alert and is able to have conversation with providers and nurse and clinically does appear to be better than when she first came in by ems - per admitting MD need to repeat lactic acid - pt is in ultrasound but will repeat when she returns

## 2016-01-15 NOTE — ED Notes (Signed)
Dr Reita Cliche notified of critical troponin and K+

## 2016-01-15 NOTE — ED Notes (Signed)
Called pharmacy and verified that K+ can be run with lactated ringers - also ask for K+ to be sent

## 2016-01-15 NOTE — ED Notes (Signed)
Pt arrived via ems for c/o weakness since Saturday and lethargic starting this am - pt has foul smell to urine and now c/o severe lower back pain - when pt was cath'd for urine sample only obtained a small amount of thick medium yellow urine - pt is tachycardiac in the 140's (appears to be a-fib rhythm)  and respirations in the 30's (labored and shallow) - Dr Reita Cliche was notified and code sepsis activated - pt is alert and oriented but slow to respond to questions

## 2016-01-15 NOTE — Progress Notes (Addendum)
Pharmacy Antibiotic Note  Heidi Villegas is a 75 y.o. female admitted on 01/12/2016 with sepsis.  Pharmacy has been consulted for vancomycin and piperacillin/tazobactam dosing. Patient received piperacillin/tazobactam 3.375 g IV and vancomycin 1000 mg IV x 1 in ED Pharmacy also consulted to assist in electrolyte monitoring  Plan: 1. Antibiotics Piperacillin/tazobactam 3.375 g IV q 8 h was ordered, this was transitioned to every 12 hours based on CrCl  Will begin vancomycin 1000 mg IV q 48 hours. Patient's renal function will likely improve with improvement of sepsis so will not order trough as the dose may need to be adjusted. Vd = 49.35 T1/2 = 38.5 Ke = 0.018  2. Electrolytes 11/23 1330 K = 2.9; mag = 2.5 MD ordered KCl 40 mEq solution x 1, KCL 30 mEq IV x 1 and KCl 20 mEq PO daily (PO not given yet) Will check K one hour after end of infusion and replace electrolytes per protocol  Addendum: 11/23 2100 K = 4.4 Will f/u electrolytes with AM labs  Height: 5\' 1"  (154.9 cm) Weight: 200 lb (90.7 kg) IBW/kg (Calculated) : 47.8  Temp (24hrs), Avg:98.8 F (37.1 C), Min:98.8 F (37.1 C), Max:98.8 F (37.1 C)   Recent Labs Lab 01/09/2016 1329 01/21/2016 1628 12/30/2015 1734  WBC 30.8*  --   --   CREATININE 3.29*  --   --   LATICACIDVEN 3.8* 10.9* 3.5*    Estimated Creatinine Clearance: 15.6 mL/min (by C-G formula based on SCr of 3.29 mg/dL (H)).    Allergies  Allergen Reactions  . Codeine Nausea And Vomiting  . Sulfa Antibiotics     Antimicrobials this admission: Piperacillin/tazobactam 11/23 >>  Vancomycin 11/23 >>   Dose adjustments this admission:   Microbiology results: 11/23 BCx: pending 11/23 UCx: pending  11/23 Sputum: pending  11/23 MRSA PCR: negative  Thank you for allowing pharmacy to be a part of this patient's care.  Darrow Bussing, PharmD Pharmacy Resident 01/19/2016 8:53 PM

## 2016-01-15 NOTE — ED Triage Notes (Signed)
Pt arrived via ems for c/o weakness since Saturday - pt has foul odor of urine - pt became lethargic this am and family decided to call ems

## 2016-01-15 NOTE — ED Provider Notes (Signed)
[facility] Emergency Department Provider Note ____________________________________________  I have reviewed the triage vital signs and the triage nursing note.  HISTORY  Chief Complaint Weakness   Historian Patient, limited by altered mental status and critical illness Husband and daughter  HPI Heidi Villegas is a 75 y.o. female who has what sounds like been sick for a week now, since last Saturday. She has refused coming into be seen or evaluated, and her husband states that he decided that she needed to be seen today. She states that she is somewhat short of breath, but has not been coughing. She denies vomiting or diarrhea. She denies abdominal pain. She's had generalized fatigue. Denies fevers. She did not know she had diabetes, but does take metformin from her medication bottles that she brought.  Denies pain, but she is very weak and somewhat ill appearing.    Past Medical History:  Diagnosis Date  . Arthritis    right hand, lower back  . Hypertension   . Macular degeneration   . Skin cancer     There are no active problems to display for this patient.   Past Surgical History:  Procedure Laterality Date  . ABDOMINAL HYSTERECTOMY    . BREAST BIOPSY    . BROW LIFT Bilateral 07/30/2014   Procedure: BLEPHAROPLASTY;  Surgeon: Karle Starch, MD;  Location: [place];  Service: Ophthalmology;  Laterality: Bilateral;  COSMETIC BILATERAL UPPER AND LOWER LIDS  . COLONOSCOPY    . WISDOM TOOTH EXTRACTION      Prior to Admission medications   Medication Sig Start Date End Date Taking? Authorizing Provider  acetaminophen (TYLENOL) 500 MG tablet Take 500 mg by mouth every 6 (six) hours as needed.   Yes Historical Provider, MD  amLODipine (NORVASC) 2.5 MG tablet Take 2.5 mg by mouth daily. AM   Yes Historical Provider, MD  aspirin 81 MG tablet Take 81 mg by mouth daily. AM   Yes Historical Provider, MD  atorvastatin (LIPITOR) 80 MG tablet Take 1  tablet by mouth daily. 12/11/15  Yes Historical Provider, MD  b complex vitamins tablet Take 1 tablet by mouth daily. AM   Yes Historical Provider, MD  Cholecalciferol (VITAMIN D-3 PO) Take 1 tablet by mouth daily. AM    Yes Historical Provider, MD  CINNAMON PO Take by mouth. AM   Yes Historical Provider, MD  metFORMIN (GLUCOPHAGE-XR) 500 MG 24 hr tablet Take 1 tablet by mouth daily. 12/11/15  Yes Historical Provider, MD  Multiple Vitamins-Minerals (CENTRUM SILVER PO) Take by mouth. AM   Yes Historical Provider, MD  Multiple Vitamins-Minerals (PRESERVISION AREDS PO) Take by mouth. AM   Yes Historical Provider, MD  Omega-3 Fatty Acids (OMEGA 3 PO) Take by mouth. AM   Yes Historical Provider, MD  potassium chloride SA (K-DUR,KLOR-CON) 20 MEQ tablet Take 1 tablet by mouth daily. 12/11/15  Yes Historical Provider, MD  TURMERIC PO Take by mouth.   Yes Historical Provider, MD    Allergies  Allergen Reactions  . Codeine Nausea And Vomiting  . Sulfa Antibiotics     No family history on file.  Social History Social History  Substance Use Topics  . Smoking status: Never Smoker  . Smokeless tobacco: Never Used  . Alcohol use Yes     Comment: wine 1 glass/month    Review of Systems  Constitutional: Negative for fever. Eyes: Negative for visual changes. ENT: Positive for dry mouth. Cardiovascular: Negative for chest pain. Respiratory: Negative for cough.  Gastrointestinal: Negative for abdominal pain, vomiting and diarrhea. Genitourinary: Negative for dysuria. Musculoskeletal: Negative for back pain. Skin: Negative for rash. Neurological: Negative for headache. 10 point Review of Systems otherwise negative ____________________________________________   PHYSICAL EXAM:  VITAL SIGNS: ED Triage Vitals [01/10/2016 1308]  Enc Vitals Group     BP      Pulse      Resp      Temp      Temp src      SpO2 94 %     Weight      Height      Head Circumference      Peak Flow      Pain  Score      Pain Loc      Pain Edu?      Excl. in [place]?      Constitutional: Sleepy but arousable. Looks pale and fatigued with increased RR. HEENT   Head: Normocephalic and atraumatic.      Eyes: Conjunctivae are normal. PERRL. Normal extraocular movements.      Ears:         Nose: No congestion/rhinnorhea.   Mouth/Throat: Mucous membranes are very dry.   Neck: No stridor. Cardiovascular/Chest:tachycardic, regular rhythm.  No murmurs, rubs, or gallops. Respiratory: Normal respiratory effort without tachypnea nor retractions. Breath sounds are clear and equal bilaterally. No wheezes/rales/rhonchi. Gastrointestinal: Soft. No distention, no guarding, no rebound. Nontender.   Genitourinary/rectal:Deferred Musculoskeletal: Nontender with normal range of motion in all extremities. No joint effusions.  No lower extremity tenderness.  No edema. Neurologic: No facial droop.  Interactive and cooperative.  Dry mouth somewhat affecting speech. No gross or focal neurologic deficits are appreciated. Skin:  Skin is warm, dry and intact. No rash noted. Psychiatric: No agitation.   ____________________________________________  LABS (pertinent positives/negatives)  Labs Reviewed  URINALYSIS COMPLETEWITH MICROSCOPIC ([facility] ONLY) - Abnormal; Notable for the following:       Result Value   Color, Urine AMBER (*)    APPearance CLOUDY (*)    Hgb urine dipstick 2+ (*)    Protein, ur 30 (*)    Leukocytes, UA 3+ (*)    Bacteria, UA MANY (*)    Squamous Epithelial / LPF 0-5 (*)    All other components within normal limits  COMPREHENSIVE METABOLIC PANEL - Abnormal; Notable for the following:    Sodium 132 (*)    Potassium 2.4 (*)    Chloride 96 (*)    CO2 19 (*)    Glucose, Bld 155 (*)    BUN 101 (*)    Creatinine, Ser 3.29 (*)    Calcium 8.6 (*)    Total Protein 6.3 (*)    Albumin 2.2 (*)    AST 88 (*)    ALT 72 (*)    Alkaline Phosphatase 251 (*)    Total Bilirubin 2.7 (*)    GFR  calc non Af Amer 13 (*)    GFR calc Af Amer 15 (*)    Anion gap 17 (*)    All other components within normal limits  TROPONIN I - Abnormal; Notable for the following:    Troponin I 0.17 (*)    All other components within normal limits  LACTIC ACID, PLASMA - Abnormal; Notable for the following:    Lactic Acid, Venous 3.8 (*)    All other components within normal limits  CBC WITH DIFFERENTIAL/PLATELET - Abnormal; Notable for the following:    WBC 30.8 (*)    RDW 15.2 (*)  Platelets 99 (*)    Neutro Abs 27.8 (*)    Monocytes Absolute 1.5 (*)    All other components within normal limits  URINE CULTURE  CULTURE, BLOOD (ROUTINE X 2)  CULTURE, BLOOD (ROUTINE X 2)  CULTURE, BLOOD (ROUTINE X 2)  CULTURE, BLOOD (ROUTINE X 2)  LACTIC ACID, PLASMA    ____________________________________________    EKG I, Lisa Roca, MD, the attending physician have personally viewed and interpreted all ECGs.  140 bpm. Irregularly irregular consistent with atrial fibrillation with rapid ventricular response. Nonspecific intraventricular conduction delay. Normal axis. Nonspecific ST and T-wave ____________________________________________  RADIOLOGY All Xrays were viewed by me. Imaging interpreted by Radiologist.  Chest xray portable:  IMPRESSION: Cardiomegaly with pulmonary vascular congestion.  Mild left lower lung subsegmental atelectasis/ scarring. __________________________________________  PROCEDURES  Procedure(s) performed: None  Critical Care performed: CRITICAL CARE Performed by: Lisa Roca   Total critical care time: 30 minutes  Critical care time was exclusive of separately billable procedures and treating other patients.  Critical care was necessary to treat or prevent imminent or life-threatening deterioration.  Critical care was time spent personally by me on the following activities: development of treatment plan with patient and/or surrogate as well as nursing,  discussions with consultants, evaluation of patient's response to treatment, examination of patient, obtaining history from patient or surrogate, ordering and performing treatments and interventions, ordering and review of laboratory studies, ordering and review of radiographic studies, pulse oximetry and re-evaluation of patient's condition.   ____________________________________________   ED COURSE / ASSESSMENT AND PLAN  Pertinent labs & imaging results that were available during my care of the patient were reviewed by me and considered in my medical decision making (see chart for details).   Ms. Canard appears extremely dehydrated and has hypotension and tachycardia which I at least believe is due to dehydration, possible sepsis as well. I did place her on the sepsis pathway and started on vancomycin and Zosyn.  She initially had some mild hypoxia, but over the course of her ED stay her O2 sat did drop into the mid 80s. Her chest x-ray shows possible left-sided pneumonia. Her urinalysis consistent with urinary tract infection.  Her lactate is elevated and she is received 2 L normal saline bolus, and I am going to switch to lactated Ringer's for additional hydration.  She was given by mouth and IV potassium for replacement of significant hypo-kalemia.  Troponin slightly elevated, I suspect supply-demand mismatch, not complaining of chest pain and ECG without acute findings.  I discussed with the hospitalist for ICU/step down admission.    CONSULTATIONS:  Hospitalist for admission.  Patient / Family / Caregiver informed of clinical course, medical decision-making process, and agree with plan.    ___________________________________________   FINAL CLINICAL IMPRESSION(S) / ED DIAGNOSES   Final diagnoses:  Sepsis, due to unspecified organism [facility])  Urinary tract infection without hematuria, site unspecified  Community acquired pneumonia of left lung, unspecified part of lung   Acute renal failure, unspecified acute renal failure type ([place])  Hypokalemia  Troponin I above reference range              Note: This dictation was prepared with Dragon dictation. Any transcriptional errors that result from this process are unintentional    Lisa Roca, MD 01/13/2016 1453

## 2016-01-15 NOTE — ED Notes (Signed)
Pt desat'ing to 88% - instructed pt on breath\ing techniques to increase o2 sat - sat remained 88% - placed on 2L of o2 via n/c - Dr Reita Cliche notified

## 2016-01-15 NOTE — ED Notes (Signed)
Patient transported to Ultrasound 

## 2016-01-15 NOTE — Progress Notes (Addendum)
CRITICAL VALUE ALERT  Critical value received:  Lactic Acid 2.1  Date of notification:  12/31/2015  Time of notification:  2210  Critical value read back:Yes.    Nurse who received alert:  Laural Golden  RN  MD notified (1st page):  Dr. Almyra Free  Time of first page:  2213  MD notified (2nd page):  Time of second page:  Responding MD:  Dr. Almyra Free  Time MD responded: 2214 No new orders

## 2016-01-15 NOTE — Progress Notes (Signed)
Patient transitioned from lovenox to SubQ heparin due to poor renal function  Melissa D Maccia, Pharm.D Clinical Pharmacist

## 2016-01-15 NOTE — Progress Notes (Signed)
CRITICAL VALUE ALERT  Critical value received:  Troponin 0.14   Date of notification:  01/02/2016  Time of notification:  2210  Critical value read back:Yes.    Nurse who received alert:  Zenovia Jarred RN  MD notified (1st page):  Dr. Almyra Free  Time of first page:  2213  MD notified (2nd page):  Time of second page:  Responding MD:  2214  Time MD responded:  Dr. Almyra Free No new orders

## 2016-01-15 NOTE — ED Notes (Addendum)
Dr Reita Cliche notified of critical lactic acid and decreased BP - VO received for lactated ringers

## 2016-01-16 ENCOUNTER — Inpatient Hospital Stay: Payer: PPO

## 2016-01-16 DIAGNOSIS — A419 Sepsis, unspecified organism: Secondary | ICD-10-CM

## 2016-01-16 DIAGNOSIS — N179 Acute kidney failure, unspecified: Secondary | ICD-10-CM

## 2016-01-16 DIAGNOSIS — J189 Pneumonia, unspecified organism: Secondary | ICD-10-CM

## 2016-01-16 DIAGNOSIS — N390 Urinary tract infection, site not specified: Secondary | ICD-10-CM

## 2016-01-16 LAB — COMPREHENSIVE METABOLIC PANEL
ALT: 114 U/L — ABNORMAL HIGH (ref 14–54)
ANION GAP: 14 (ref 5–15)
AST: 138 U/L — ABNORMAL HIGH (ref 15–41)
Albumin: 2.2 g/dL — ABNORMAL LOW (ref 3.5–5.0)
Alkaline Phosphatase: 262 U/L — ABNORMAL HIGH (ref 38–126)
BUN: 100 mg/dL — ABNORMAL HIGH (ref 6–20)
CHLORIDE: 101 mmol/L (ref 101–111)
CO2: 17 mmol/L — AB (ref 22–32)
Calcium: 8 mg/dL — ABNORMAL LOW (ref 8.9–10.3)
Creatinine, Ser: 2.82 mg/dL — ABNORMAL HIGH (ref 0.44–1.00)
GFR, EST AFRICAN AMERICAN: 18 mL/min — AB (ref >60)
GFR, EST NON AFRICAN AMERICAN: 16 mL/min — AB (ref >60)
Glucose, Bld: 158 mg/dL — ABNORMAL HIGH (ref 65–99)
Potassium: 3.2 mmol/L — ABNORMAL LOW (ref 3.5–5.1)
SODIUM: 132 mmol/L — AB (ref 135–145)
Total Bilirubin: 3.5 mg/dL — ABNORMAL HIGH (ref 0.3–1.2)
Total Protein: 6.1 g/dL — ABNORMAL LOW (ref 6.5–8.1)

## 2016-01-16 LAB — BLOOD CULTURE ID PANEL (REFLEXED)
ACINETOBACTER BAUMANNII: NOT DETECTED
Acinetobacter baumannii: NOT DETECTED
CANDIDA ALBICANS: NOT DETECTED
CANDIDA GLABRATA: NOT DETECTED
CANDIDA GLABRATA: NOT DETECTED
CANDIDA KRUSEI: NOT DETECTED
CANDIDA TROPICALIS: NOT DETECTED
Candida albicans: NOT DETECTED
Candida krusei: NOT DETECTED
Candida parapsilosis: NOT DETECTED
Candida parapsilosis: NOT DETECTED
Candida tropicalis: NOT DETECTED
Carbapenem resistance: NOT DETECTED
Carbapenem resistance: NOT DETECTED
ENTEROBACTER CLOACAE COMPLEX: NOT DETECTED
ENTEROBACTERIACEAE SPECIES: DETECTED — AB
ESCHERICHIA COLI: DETECTED — AB
ESCHERICHIA COLI: DETECTED — AB
Enterobacter cloacae complex: NOT DETECTED
Enterobacteriaceae species: DETECTED — AB
Enterococcus species: NOT DETECTED
Enterococcus species: NOT DETECTED
HAEMOPHILUS INFLUENZAE: NOT DETECTED
Haemophilus influenzae: NOT DETECTED
KLEBSIELLA OXYTOCA: NOT DETECTED
KLEBSIELLA PNEUMONIAE: NOT DETECTED
KLEBSIELLA PNEUMONIAE: NOT DETECTED
Klebsiella oxytoca: NOT DETECTED
LISTERIA MONOCYTOGENES: NOT DETECTED
Listeria monocytogenes: NOT DETECTED
Methicillin resistance: NOT DETECTED
NEISSERIA MENINGITIDIS: NOT DETECTED
NEISSERIA MENINGITIDIS: NOT DETECTED
PROTEUS SPECIES: NOT DETECTED
PSEUDOMONAS AERUGINOSA: NOT DETECTED
Proteus species: NOT DETECTED
Pseudomonas aeruginosa: NOT DETECTED
SERRATIA MARCESCENS: NOT DETECTED
STREPTOCOCCUS AGALACTIAE: NOT DETECTED
STREPTOCOCCUS PYOGENES: NOT DETECTED
STREPTOCOCCUS SPECIES: NOT DETECTED
STREPTOCOCCUS SPECIES: NOT DETECTED
Serratia marcescens: NOT DETECTED
Staphylococcus aureus (BCID): NOT DETECTED
Staphylococcus aureus (BCID): NOT DETECTED
Staphylococcus species: NOT DETECTED
Staphylococcus species: DETECTED — AB
Streptococcus agalactiae: NOT DETECTED
Streptococcus pneumoniae: NOT DETECTED
Streptococcus pneumoniae: NOT DETECTED
Streptococcus pyogenes: NOT DETECTED
Vancomycin resistance: NOT DETECTED

## 2016-01-16 LAB — GLUCOSE, CAPILLARY
GLUCOSE-CAPILLARY: 173 mg/dL — AB (ref 65–99)
Glucose-Capillary: 142 mg/dL — ABNORMAL HIGH (ref 65–99)
Glucose-Capillary: 227 mg/dL — ABNORMAL HIGH (ref 65–99)
Glucose-Capillary: 213 mg/dL — ABNORMAL HIGH (ref 65–99)

## 2016-01-16 LAB — CBC
HCT: 39.2 % (ref 35.0–47.0)
HEMOGLOBIN: 13.4 g/dL (ref 12.0–16.0)
MCH: 29 pg (ref 26.0–34.0)
MCHC: 34.2 g/dL (ref 32.0–36.0)
MCV: 84.8 fL (ref 80.0–100.0)
PLATELETS: 101 10*3/uL — AB (ref 150–440)
RBC: 4.61 MIL/uL (ref 3.80–5.20)
RDW: 14.8 % — ABNORMAL HIGH (ref 11.5–14.5)
WBC: 25.7 10*3/uL — AB (ref 3.6–11.0)

## 2016-01-16 LAB — BASIC METABOLIC PANEL
Anion gap: 13 (ref 5–15)
BUN: 99 mg/dL — AB (ref 6–20)
CALCIUM: 7.7 mg/dL — AB (ref 8.9–10.3)
CO2: 17 mmol/L — ABNORMAL LOW (ref 22–32)
CREATININE: 3.07 mg/dL — AB (ref 0.44–1.00)
Chloride: 103 mmol/L (ref 101–111)
GFR calc Af Amer: 16 mL/min — ABNORMAL LOW (ref >60)
GFR, EST NON AFRICAN AMERICAN: 14 mL/min — AB (ref >60)
GLUCOSE: 185 mg/dL — AB (ref 65–99)
Potassium: 4.2 mmol/L (ref 3.5–5.1)
Sodium: 133 mmol/L — ABNORMAL LOW (ref 135–145)

## 2016-01-16 LAB — BLOOD GAS, ARTERIAL
ACID-BASE DEFICIT: 3 mmol/L — AB (ref 0.0–2.0)
Acid-base deficit: 7.6 mmol/L — ABNORMAL HIGH (ref 0.0–2.0)
BICARBONATE: 16.4 mmol/L — AB (ref 20.0–28.0)
BICARBONATE: 19.9 mmol/L — AB (ref 20.0–28.0)
DELIVERY SYSTEMS: POSITIVE
Delivery systems: POSITIVE
EXPIRATORY PAP: 6
Expiratory PAP: 6
FIO2: 0.35
FIO2: 0.35
INSPIRATORY PAP: 12
Inspiratory PAP: 12
MECHANICAL RATE: 8
Mechanical Rate: 8
O2 SAT: 88 %
O2 Saturation: 93.3 %
PATIENT TEMPERATURE: 37
PATIENT TEMPERATURE: 37
PCO2 ART: 29 mmHg — AB (ref 32.0–48.0)
PH ART: 7.36 (ref 7.350–7.450)
PO2 ART: 57 mmHg — AB (ref 83.0–108.0)
pCO2 arterial: 28 mmHg — ABNORMAL LOW (ref 32.0–48.0)
pH, Arterial: 7.46 — ABNORMAL HIGH (ref 7.350–7.450)
pO2, Arterial: 64 mmHg — ABNORMAL LOW (ref 83.0–108.0)

## 2016-01-16 LAB — INFLUENZA PANEL BY PCR (TYPE A & B)
INFLAPCR: NEGATIVE
Influenza B By PCR: NEGATIVE

## 2016-01-16 LAB — LACTIC ACID, PLASMA: LACTIC ACID, VENOUS: 2.2 mmol/L — AB (ref 0.5–1.9)

## 2016-01-16 LAB — TROPONIN I
TROPONIN I: 0.26 ng/mL — AB (ref <0.03)
Troponin I: 0.11 ng/mL (ref <0.03)

## 2016-01-16 LAB — TSH: TSH: 0.663 u[IU]/mL (ref 0.350–4.500)

## 2016-01-16 LAB — MAGNESIUM: Magnesium: 2.3 mg/dL (ref 1.7–2.4)

## 2016-01-16 MED ORDER — METOPROLOL TARTRATE 5 MG/5ML IV SOLN
5.0000 mg | INTRAVENOUS | Status: DC
Start: 2016-01-16 — End: 2016-01-16
  Administered 2016-01-16: 5 mg via INTRAVENOUS

## 2016-01-16 MED ORDER — AMIODARONE LOAD VIA INFUSION
150.0000 mg | Freq: Once | INTRAVENOUS | Status: AC
  Administered 2016-01-16: 150 mg via INTRAVENOUS
  Filled 2016-01-16: qty 83.34

## 2016-01-16 MED ORDER — MEROPENEM-SODIUM CHLORIDE 500 MG/50ML IV SOLR
500.0000 mg | Freq: Two times a day (BID) | INTRAVENOUS | Status: DC
  Administered 2016-01-16 – 2016-01-19 (×6): 500 mg via INTRAVENOUS
  Filled 2016-01-16 (×7): qty 50

## 2016-01-16 MED ORDER — ORAL CARE MOUTH RINSE: 15.0000 mL | Freq: Two times a day (BID) | OROMUCOSAL | Status: DC

## 2016-01-16 MED ORDER — AMLODIPINE BESYLATE 5 MG PO TABS
2.5000 mg | ORAL_TABLET | Freq: Every day | ORAL | Status: DC
  Administered 2016-01-19: 2.5 mg via ORAL
  Filled 2016-01-16 (×2): qty 1

## 2016-01-16 MED ORDER — MORPHINE SULFATE (PF) 4 MG/ML IV SOLN
2.0000 mg | INTRAVENOUS | Status: AC
  Administered 2016-01-16: 2 mg via INTRAVENOUS

## 2016-01-16 MED ORDER — SODIUM CHLORIDE 0.9 % IV SOLN
INTRAVENOUS | Status: DC
  Administered 2016-01-16: 14:00:00 via INTRAVENOUS

## 2016-01-16 MED ORDER — METOPROLOL TARTRATE 5 MG/5ML IV SOLN
INTRAVENOUS | Status: AC
  Filled 2016-01-16: qty 5

## 2016-01-16 MED ORDER — AMIODARONE HCL IN DEXTROSE 360-4.14 MG/200ML-% IV SOLN
30.0000 mg/h | INTRAVENOUS | Status: DC
  Administered 2016-01-16 – 2016-01-17 (×3): 30 mg/h via INTRAVENOUS
  Filled 2016-01-16 (×2): qty 200

## 2016-01-16 MED ORDER — MORPHINE SULFATE (PF) 4 MG/ML IV SOLN
INTRAVENOUS | Status: AC
  Administered 2016-01-16: 2 mg via INTRAVENOUS
  Filled 2016-01-16: qty 1

## 2016-01-16 MED ORDER — IPRATROPIUM-ALBUTEROL 0.5-2.5 (3) MG/3ML IN SOLN
3.0000 mL | RESPIRATORY_TRACT | Status: DC | PRN
  Administered 2016-01-23 – 2016-01-26 (×2): 3 mL via RESPIRATORY_TRACT
  Filled 2016-01-16 (×2): qty 3

## 2016-01-16 MED ORDER — STERILE WATER FOR INJECTION IV SOLN
INTRAVENOUS | Status: DC
  Administered 2016-01-16 – 2016-01-17 (×2): via INTRAVENOUS
  Filled 2016-01-16 (×6): qty 850

## 2016-01-16 MED ORDER — METOPROLOL TARTRATE 5 MG/5ML IV SOLN
INTRAVENOUS | Status: AC
  Administered 2016-01-16: 5 mg via INTRAVENOUS
  Filled 2016-01-16: qty 5

## 2016-01-16 MED ORDER — IPRATROPIUM-ALBUTEROL 0.5-2.5 (3) MG/3ML IN SOLN
3.0000 mL | Freq: Four times a day (QID) | RESPIRATORY_TRACT | Status: DC
  Administered 2016-01-16 – 2016-01-28 (×50): 3 mL via RESPIRATORY_TRACT
  Filled 2016-01-16 (×51): qty 3

## 2016-01-16 MED ORDER — SODIUM CHLORIDE 0.9 % IV SOLN
500.0000 mg | Freq: Two times a day (BID) | INTRAVENOUS | Status: DC
  Administered 2016-01-16: 500 mg via INTRAVENOUS
  Filled 2016-01-16 (×2): qty 0.5

## 2016-01-16 MED ORDER — SODIUM CHLORIDE 0.9 % IV SOLN
30.0000 meq | Freq: Once | INTRAVENOUS | Status: AC
  Administered 2016-01-16: 30 meq via INTRAVENOUS
  Filled 2016-01-16 (×2): qty 15

## 2016-01-16 MED ORDER — POTASSIUM CHLORIDE CRYS ER 20 MEQ PO TBCR
20.0000 meq | EXTENDED_RELEASE_TABLET | Freq: Once | ORAL | Status: AC
  Administered 2016-01-16: 20 meq via ORAL
  Filled 2016-01-16: qty 1

## 2016-01-16 MED ORDER — SODIUM BICARBONATE 8.4 % IV SOLN: INTRAVENOUS | Status: DC

## 2016-01-16 MED ORDER — CHLORHEXIDINE GLUCONATE 0.12 % MT SOLN
15.0000 mL | Freq: Two times a day (BID) | OROMUCOSAL | Status: DC
  Administered 2016-01-16: 15 mL via OROMUCOSAL

## 2016-01-16 MED ORDER — METOPROLOL TARTRATE 5 MG/5ML IV SOLN
5.0000 mg | INTRAVENOUS | Status: DC | PRN
  Administered 2016-01-16 – 2016-01-26 (×3): 5 mg via INTRAVENOUS
  Filled 2016-01-16 (×2): qty 5

## 2016-01-16 MED ORDER — SODIUM BICARBONATE 8.4 % IV SOLN
150.0000 meq | Freq: Once | INTRAVENOUS | Status: AC
  Administered 2016-01-16: 150 meq via INTRAVENOUS
  Filled 2016-01-16: qty 150

## 2016-01-16 MED ORDER — AMIODARONE HCL IN DEXTROSE 360-4.14 MG/200ML-% IV SOLN
60.0000 mg/h | INTRAVENOUS | Status: AC
  Administered 2016-01-16 (×2): 60 mg/h via INTRAVENOUS
  Filled 2016-01-16 (×2): qty 200

## 2016-01-16 NOTE — Progress Notes (Signed)
Called Spouse Estill Batten and updated with BiPAP and continued elevated HR. Spouse says He "will be to the hospital at some point today.". Endorsed to Public house manager.

## 2016-01-16 NOTE — Plan of Care (Signed)
Pt had 1 urine output unable to measure due to pt being incontinent.

## 2016-01-16 NOTE — Progress Notes (Signed)
Chaplain rounded the unit to provide a compassionate presence and support to the patient. Patient appeared to be sleeping.  No visitors were at the bedside. Chaplain provided silent prayer. Heidi Villegas [phone]

## 2016-01-16 NOTE — Plan of Care (Signed)
Notified MD of the BP changes.  BP (!) 161/77   Pulse (!) 129   Temp 98.5 F (36.9 C) (Axillary)   Resp (!) 26   Ht 5\' 1"  (1.549 m)   Wt 90.7 kg (200 lb)   SpO2 97%   BMI 37.79 kg/m   115/119           131/35              161/77  No new orders at this time.

## 2016-01-16 NOTE — Progress Notes (Signed)
Patient's HR sustaining 150's, +Blood Cx noted. LA 2.2, Potassium 3.2 po potassium administered, awaiting IV potassium from Rx. [place] NP is aware. Endorse to Public house manager.

## 2016-01-16 NOTE — Consult Note (Addendum)
PULMONARY / CRITICAL CARE MEDICINE   Name: Heidi Villegas MRN: [id number] DOB: 03/26/1940    ADMISSION DATE:  01/19/2016    ASSESSMENT / PLAN:  PULMONARY A: Acute respiratory failure possibly related to pulmonary edema and/or sepsis with metabolic acidosis.  P:   Continue Bipap, wean as tolerated DC iv fluids. Will hold off diuresing due to worsening renal function  CARDIOVASCULAR A:  Hypertension Tachycardia Elevated troponin, likely stress related.  Afib with RVR.  P:  Continuous telemetry Trend troponin Continue amlodipine Amiodarone infusion ordered.   RENAL A:   AKI possibly related to sepsis Left hydronephrosis without obstructing cause identified Right renal mass.  P:   Follow BMET Avoid nephrotoxic drugs Replace electrolytes per ICU protocol Further workup with imaging when more stable.   GASTROINTESTINAL A:   No active issues P:   Carb modified diet  HEMATOLOGIC A:   No active issues P:  Heparin for DVT prophylaxis Transfuse per usual guidelines  INFECTIOUS A:   Sepsis possibly related to UTI.  P:   Monitor fever curve Continue van/zosyn Follow cultures Trend lactic acid Follow cbc  CULTURES: 11/23 BC; GNR; PCR- enterobacter, Ecoli 11/23 UC; pending.  11/24 Flu ordered.  MRSA PCR 11/23 negative.   ANTIBIOTICS: 11/23 Vancomycin>>11/24 11/23 Zosyn  ENDOCRINE A:   Diabetes Melitus P:   BS checks  SSI Coverage  NEUROLOGIC A:   No active issues P:   Minimize sedating drugs Lights on during day time Cluster care   STUDIES:   11/23 Renal ultrasound>>Mild to moderate left hydronephrosis without obstructing cause identified, right renal mass.   SIGNIFICANT EVENTS: 11/23 Patient was admitted with severe sepsis secondary to pylonephritis 11/24 Patient was in severe respiratory distress , requiring BiPAP  LINES/TUBES: 01/16/16 Right IJ>>   -----------------------------------------  CONSULTATION DATE:   01/16/16  REFERRING MD: Dr. Estanislado Pandy  CHIEF COMPLAINT:  Respiratory distress  HISTORY OF PRESENT ILLNESS:   Heidi Villegas is a 75 yo female with PMH significant for Arthritis, Hypertension and DM.  Patient [facility] 11/23 with severe sepsis, elevated lactic acid and altered mental status. On 11/24 patient was  Was noted to be severe respiratory distress along with tachycardia and hypertension.  PCCM team was consulted for further management.  Review of CXR; elevated right diaphragm, slight pulm edema/atelectasis.  ABG showed metabolic acidosis, mild lactic acidosis with one spurious value of 10>> 2.1  PAST MEDICAL HISTORY :  She  has a past medical history of Arthritis; Hypertension; Macular degeneration; and Skin cancer.  PAST SURGICAL HISTORY: She  has a past surgical history that includes Wisdom tooth extraction; Abdominal hysterectomy; Breast biopsy; Colonoscopy; and Brow lift (Bilateral, 07/30/2014).  Allergies  Allergen Reactions  . Codeine Nausea And Vomiting  . Sulfa Antibiotics     No current facility-administered medications on file prior to encounter.    Current Outpatient Prescriptions on File Prior to Encounter  Medication Sig  . amLODipine (NORVASC) 2.5 MG tablet Take 2.5 mg by mouth daily. AM  . aspirin 81 MG tablet Take 81 mg by mouth daily. AM  . b complex vitamins tablet Take 1 tablet by mouth daily. AM  . Cholecalciferol (VITAMIN D-3 PO) Take 1 tablet by mouth daily. AM   . CINNAMON PO Take by mouth. AM  . Multiple Vitamins-Minerals (CENTRUM SILVER PO) Take by mouth. AM  . Multiple Vitamins-Minerals (PRESERVISION AREDS PO) Take by mouth. AM  . Omega-3 Fatty Acids (OMEGA 3 PO) Take by mouth. AM  . TURMERIC  PO Take by mouth.    FAMILY HISTORY:  Her has no family status information on file.    SOCIAL HISTORY: She  reports that she has never smoked. She has never used smokeless tobacco. She reports that she drinks alcohol.  REVIEW OF SYSTEMS:    Unable to obtain as the patient is in severe respiratory distress SUBJECTIVE:  Unable to obtain as the patient is in severe respiratory distress  VITAL SIGNS: BP 101/74   Pulse (!) 147   Temp 98.8 F (37.1 C) (Oral)   Resp (!) 29   Ht 5\' 1"  (1.549 m)   Wt 90.7 kg (200 lb)   SpO2 94%   BMI 37.79 kg/m   HEMODYNAMICS:    VENTILATOR SETTINGS:    INTAKE / OUTPUT: I/O last 3 completed shifts: In: 4250 [I.V.:1000; IV Piggyback:3250] Out: -   PHYSICAL EXAMINATION: General:  Elderly white female in severe respiratory distress Neuro: drowsy but arousable, follows command HEENT:  Atraumatic, normocephalic, no discharge, no JVD appreciated Cardiovascular:  Tachycardia, no MRG noted Lungs: Faint expiratory wheezes,  Abdomen:  Soft, nontender, active bowel sounds Musculoskeletal:  No inflammation/deformity noted Skin:  Grossly intact  LABS:  BMET  Recent Labs Lab 12/29/2015 1329 01/09/2016 2101  NA 132*  --   K 2.4* 4.4  CL 96*  --   CO2 19*  --   BUN 101*  --   CREATININE 3.29*  --   GLUCOSE 155*  --     Electrolytes  Recent Labs Lab 12/29/2015 1329 01/11/2016 1341  CALCIUM 8.6*  --   MG  --  2.5*    CBC  Recent Labs Lab 01/05/2016 1329  WBC 30.8*  HGB 14.1  HCT 41.4  PLT 99*    Coag's No results for input(s): APTT, INR in the last 168 hours.  Sepsis Markers  Recent Labs Lab 01/21/2016 1628 01/11/2016 1734 01/11/2016 2121  LATICACIDVEN 10.9* 3.5* 2.1*    ABG No results for input(s): PHART, PCO2ART, PO2ART in the last 168 hours.  Liver Enzymes  Recent Labs Lab 12/26/2015 1329  AST 88*  ALT 72*  ALKPHOS 251*  BILITOT 2.7*  ALBUMIN 2.2*    Cardiac Enzymes  Recent Labs Lab 01/21/2016 1329 01/20/2016 2121 01/16/16 0144  TROPONINI 0.17* 0.14* 0.26*    Glucose  Recent Labs Lab 01/12/2016 1744 01/07/2016 2127 12/24/2015 2241  GLUCAP 201* 202* 175*    Imaging US Renal  Result Date: 01/14/2016 CLINICAL DATA:  75 year old female with acute  kidney injury and UTI. EXAM: RENAL / URINARY TRACT ULTRASOUND COMPLETE COMPARISON:  None. FINDINGS: Right Kidney: Length: 11.8 cm. A 0.4 x 0.4 x 0.5 cm hyperechoic mass within the right midpole and a 2.2 x 2.3 x 2.3 cm slightly hyperechoic area within the lower pole identified. There is no evidence of hydronephrosis. Echogenicity is within normal limits. 92.5 cm cyst within the lower pole is present. Left Kidney: Length: 13.1 cm. Echogenicity within normal limits. Mild to moderate left hydronephrosis is present. No solid or cystic mass noted. Bladder: Appears normal for degree of bladder distention. IMPRESSION: Mild to moderate left hydronephrosis without obstructing cause identified. 0.5 cm and 2.3 cm possible right renal masses as described above. If no prior outside studies are available for comparison, recommend contrast CT or MRI if clinically able. Electronically Signed   By: Margarette Canada M.D.   On: 12/25/2015 17:45   Dg Chest Port 1 View  Result Date: 01/11/2016 CLINICAL DATA:  Altered mental status and tachypnea.  EXAM: PORTABLE CHEST 1 VIEW COMPARISON:  None. FINDINGS: Cardiomegaly and pulmonary vascular congestion noted. Elevation of the right hemidiaphragm is present. Mild left lower lung subsegmental atelectasis/ scarring identified. There is no evidence of pneumothorax or definite pleural effusion. No acute bony abnormalities are identified. IMPRESSION: Cardiomegaly with pulmonary vascular congestion. Mild left lower lung subsegmental atelectasis/ scarring. Electronically Signed   By: Margarette Canada M.D.   On: 01/05/2016 14:14     Deep Ashby Dawes, M.D. [facility] 01/16/2016   Critical Care Attestation.  I have personally obtained a history, examined the patient, evaluated laboratory and imaging results, formulated the assessment and plan and placed orders. The Patient requires high complexity decision making for assessment and support, frequent evaluation and titration of therapies,  application of advanced monitoring technologies and extensive interpretation of multiple databases. The patient has critical illness that could lead imminently to failure of 1 or more organ systems and requires the highest level of physician preparedness to intervene.  Critical Care Time devoted to patient care services described in this note is 45 minutes and is exclusive of time spent in procedures.

## 2016-01-16 NOTE — Procedures (Signed)
Central Venous Catheter Insertion Procedure Note- Right Internal Jugular Heidi Villegas [id number] February 22, 1941  Procedure: Insertion of Central Venous Catheter Indications: Assessment of intravascular volume, Drug and/or fluid administration and Frequent blood sampling  Procedure Details Consent: Risks of procedure as well as the alternatives and risks of each were explained to the (patient/caregiver).  Consent for procedure obtained. Time Out: Verified patient identification, verified procedure, site/side was marked, verified correct patient position, special equipment/implants available, medications/allergies/relevent history reviewed, required imaging and test results available.  Performed  Maximum sterile technique was used including antiseptics, cap, gloves, gown, hand hygiene, mask and sheet. Skin prep: Chlorhexidine; local anesthetic administered A antimicrobial bonded/coated triple lumen catheter was placed in the right internal jugular vein using the Seldinger technique.  Evaluation Blood flow good Complications: No apparent complications Patient did tolerate procedure well. Chest X-ray ordered to verify placement.  CXR: pending.  Procedure performed under direct ultrasound guidance for real time vessel cannulation.       [facility]

## 2016-01-16 NOTE — Progress Notes (Signed)
Patient with resp. Distress 83%  using accessory muscles and Afib/RVR. O2 in creased to 4L, HOB elevated. MD Pyreddy and Hugelmyer made aware. N/o for metoprolol 5mg  IV and neb tx noted and administered with HR reducing to 137 and sustaining. Awaiting sputum for collection. Will cont to monitor and endorse.

## 2016-01-16 NOTE — Plan of Care (Signed)
MD aware pt has had no output since shift started at 7am, bladder scan shows 61 ml

## 2016-01-16 NOTE — Plan of Care (Signed)
Problem: Education: Goal: Knowledge of treatment and prevention of UTI/Pyleonephritis will improve Outcome: Progressing Patient reports never having a UTI. Patient demonstrated improper peri-care hygiene when cleaning self after elimination. RN educated patient and proper wiping techniques for women, specifically always cleaning from front to back to prevent contaminating vaginal area with fecal matter. Patient verbalized understanding saying "I never knew  I was suppose to clean like that." Patient was given a printed educational handout addressing signs and symptoms of UTI and how to prevent UTI. Patient reports the reading print was ok and asked some questions that RN was able to answer.

## 2016-01-16 NOTE — Progress Notes (Signed)
CRITICAL VALUE ALERT  Critical value received:  Lactic Acid 2.2   Date of notification:  01/16/16  Time of notification:  0600  Critical value read back:Yes.    Nurse who received alert:  Nani Ravens RN   MD notified (1st page):  Bincy NP   Time of first page:  0608  MD notified (2nd page):  Time of second page:  Responding MD:  Cherre Robins NP  Time MD responded:  [phone]

## 2016-01-16 NOTE — Progress Notes (Signed)
[facility] [place] NAME: Heidi Villegas    MR#:  [id number]  DATE OF BIRTH:  [birth year]  SUBJECTIVE:   Pt. Here due to altered mental status and noted to Sepsis due to UTI.  Noted to be in a. Fib w/ RVR and on the hypotensive side. Also noted to be in AKI.  Lethargic but follows commands.   REVIEW OF SYSTEMS:    Review of Systems  Constitutional: Negative for chills and fever.  HENT: Negative for congestion and tinnitus.   Eyes: Negative for blurred vision and double vision.  Respiratory: Positive for shortness of breath. Negative for cough and wheezing.   Cardiovascular: Negative for chest pain, orthopnea and PND.  Gastrointestinal: Negative for abdominal pain, diarrhea, nausea and vomiting.  Genitourinary: Negative for dysuria and hematuria.  Neurological: Positive for weakness. Negative for dizziness, sensory change and focal weakness.  All other systems reviewed and are negative.   Nutrition: Carb modified Tolerating Diet: Not much Tolerating PT: Await Eval.   DRUG ALLERGIES:   Allergies  Allergen Reactions  . Codeine Nausea And Vomiting  . Sulfa Antibiotics     VITALS:  Blood pressure (!) 93/57, pulse (!) 114, temperature 98.5 F (36.9 C), temperature source Axillary, resp. rate (!) 26, height _0  (1.549 m), weight 90.7 kg (200 lb), SpO2 95 %.  PHYSICAL EXAMINATION:   Physical Exam  GENERAL:  75 y.o.-year-old patient lying in the bed in mild resp. Distress on bipap.  EYES: Pupils equal, round, reactive to light. . No scleral icterus. Extraocular muscles intact.  HEENT: Head atraumatic, normocephalic. Oropharynx and nasopharynx clear.  NECK:  Supple, no jugular venous distention. No thyroid enlargement, no tenderness.  LUNGS: Normal breath sounds bilaterally, no wheezing, bibasilar rales, No rhonchi. Positive use of accessory muscles of respiration.  CARDIOVASCULAR: S1, S2 normal. No murmurs, rubs, or gallops.   ABDOMEN: Soft, nontender, nondistended. Bowel sounds present. No organomegaly or mass.  EXTREMITIES: No cyanosis, clubbing or edema b/l.    NEUROLOGIC: Cranial nerves II through XII are intact. No focal Motor or sensory deficits b/l. Globally weak.  PSYCHIATRIC: The patient is alert and oriented x 2.  SKIN: No obvious rash, lesion, or ulcer.    LABORATORY PANEL:   CBC  Recent Labs Lab 01/16/16 0455  WBC 25.7*  HGB 13.4  HCT 39.2  PLT 101*   ------------------------------------------------------------------------------------------------------------------  Chemistries   Recent Labs Lab 01/16/16 0455 01/16/16 1215  NA 132* 133*  K 3.2* 4.2  CL 101 103  CO2 17* 17*  GLUCOSE 158* 185*  BUN 100* 99*  CREATININE 2.82* 3.07*  CALCIUM 8.0* 7.7*  MG 2.3  --   AST 138*  --   ALT 114*  --   ALKPHOS 262*  --   BILITOT 3.5*  --    ------------------------------------------------------------------------------------------------------------------  Cardiac Enzymes  Recent Labs Lab 01/16/16 0455  TROPONINI 0.11*   ------------------------------------------------------------------------------------------------------------------  RADIOLOGY:  US Renal  Result Date: 01/10/2016 CLINICAL DATA:  75 year old female with acute kidney injury and UTI. EXAM: RENAL / URINARY TRACT ULTRASOUND COMPLETE COMPARISON:  None. FINDINGS: Right Kidney: Length: 11.8 cm. A 0.4 x 0.4 x 0.5 cm hyperechoic mass within the right midpole and a 2.2 x 2.3 x 2.3 cm slightly hyperechoic area within the lower pole identified. There is no evidence of hydronephrosis. Echogenicity is within normal limits. 92.5 cm cyst within the lower pole is present. Left Kidney: Length: 13.1 cm. Echogenicity within normal limits. Mild to moderate  left hydronephrosis is present. No solid or cystic mass noted. Bladder: Appears normal for degree of bladder distention. IMPRESSION: Mild to moderate left hydronephrosis without  obstructing cause identified. 0.5 cm and 2.3 cm possible right renal masses as described above. If no prior outside studies are available for comparison, recommend contrast CT or MRI if clinically able. Electronically Signed   By: Margarette Canada M.D.   On: 01/08/2016 17:45   Dg Chest Port 1 View  Result Date: 01/16/2016 CLINICAL DATA:  Central line placement EXAM: PORTABLE CHEST 1 VIEW COMPARISON:  01/12/2016 FINDINGS: Shallow inspiration. Cardiac enlargement with central pulmonary vascular congestion. No edema. Atelectasis in the lung bases. No focal consolidation. No blunting of costophrenic angles. Interval placement of a right central venous catheter with tip over the cavoatrial junction region. No pneumothorax. IMPRESSION: Central venous catheter appears in satisfactory position. No pneumothorax. Cardiac enlargement with pulmonary vascular congestion. Shallow inspiration with atelectasis in the lung bases. Electronically Signed   By: Lucienne Capers M.D.   On: 01/16/2016 04:37   Dg Chest Port 1 View  Result Date: 01/22/2016 CLINICAL DATA:  Altered mental status and tachypnea. EXAM: PORTABLE CHEST 1 VIEW COMPARISON:  None. FINDINGS: Cardiomegaly and pulmonary vascular congestion noted. Elevation of the right hemidiaphragm is present. Mild left lower lung subsegmental atelectasis/ scarring identified. There is no evidence of pneumothorax or definite pleural effusion. No acute bony abnormalities are identified. IMPRESSION: Cardiomegaly with pulmonary vascular congestion. Mild left lower lung subsegmental atelectasis/ scarring. Electronically Signed   By: Margarette Canada M.D.   On: 12/27/2015 14:14     ASSESSMENT AND PLAN:   75 year old female with past medical history of hypertension, osteoarthritis, macular degeneration, skin cancer presented to the hospital due to altered mental status and noted to be in sepsis.  1. Sepsis-patient met criteria admission given severe hypotension, leukocytosis,  tachycardia, elevated lactic acid. -This was of patient's sepsis is secondary to a urinary tract infection. Continue IV meropenem, follow blood, urine cultures. Pulmonary blood cultures are positive for staph and Escherichia coli.  2. Urinary tract infection-the source of patient's sepsis. Continue IV meropenem, follow urine cultures.  3. Acute kidney injury-secondary to sepsis/ATN -Continue treatment of underlying sepsis, continue gentle IV fluids, follow BUN/creatinine urine output. -renal dose meds avoid nephrotoxins. Renal ultrasound showing no evidence of acute obstruction.  4. Atrial fibrillation with rapid ventricular response-secondary to underlying sepsis. -Continue amiodarone drip. Appreciate cardiology input and continue current care and no plans for further intervention presently.  5. Leukocytosis-secondary to sepsis and UTI. We'll follow white cell count with IV abx therapy.  6. Hyperlipidemia-continue atorvastatin.  7. Acute respiratory failure with hypoxia-etiology unclear. Patient was on BiPAP but currently has been weaned off of it. Follow serial ABGs. Questionable related to volume overload and we'll follow clinically.  8. Elevated troponin-likely setting of supply demand ischemia secondary to sepsis/poor renal clearance. Troponins actually trended down.    All the records are reviewed and case discussed with Care Management/Social Worker. Management plans discussed with the patient, family and they are in agreement.  CODE STATUS: Full code   DVT Prophylaxis: Hep. SQ  TOTAL TIME TAKING CARE OF THIS PATIENT: 30 minutes.   POSSIBLE D/C IN 2-3 DAYS, DEPENDING ON CLINICAL CONDITION.   Henreitta Leber M.D on 01/16/2016 at 2:07 PM  Between 7am to 6pm - Pager - [phone]  After 6pm go to [email or web address] - Proofreader  [facility]  Office  [phone]  CC: Primary care physician; Gayland Curry, MD

## 2016-01-16 NOTE — Progress Notes (Signed)
Pharmacy Antibiotic Note  Heidi Villegas is a 75 y.o. female admitted on 01/12/2016 with sepsis.  Pharmacy has been consulted for vancomycin and piperacillin/tazobactam dosing. Patient received piperacillin/tazobactam 3.375 g IV and vancomycin 1000 mg IV x 1 in ED Pharmacy also consulted to assist in electrolyte monitoring  Plan: 1. Antibiotics Piperacillin/tazobactam 3.375 g IV q 8 h was ordered, this was transitioned to every 12 hours based on CrCl  Will begin vancomycin 1000 mg IV q 48 hours. Patient's renal function will likely improve with improvement of sepsis so will not order trough as the dose may need to be adjusted. Vd = 49.35 T1/2 = 38.5 Ke = 0.018  2. Electrolytes 11/23 1330 K = 2.9; mag = 2.5 MD ordered KCl 40 mEq solution x 1, KCL 30 mEq IV x 1 and KCl 20 mEq PO daily (PO not given yet) Will check K one hour after end of infusion and replace electrolytes per protocol  Addendum: 11/23 2100 K = 4.4 Will f/u electrolytes with AM labs  11/24 0455 K 3.2, Mg 2.3. Prescriber ordered potassium chloride 30 mEq IV x 1. Will also order potassium chloride 20 mEq PO x 1 and recheck BMP this afternoon. [address] [place], [place].D., BCPS, Clincial Pharmacist  Height: 5\' 1"  (154.9 cm) Weight: 200 lb (90.7 kg) IBW/kg (Calculated) : 47.8  Temp (24hrs), Avg:98.8 F (37.1 C), Min:98.8 F (37.1 C), Max:98.8 F (37.1 C)   Recent Labs Lab 01/14/2016 1329 01/10/2016 1628 01/16/2016 1734 01/01/2016 2121 01/16/16 0455  WBC 30.8*  --   --   --  25.7*  CREATININE 3.29*  --   --   --  2.82*  LATICACIDVEN 3.8* 10.9* 3.5* 2.1* 2.2*    Estimated Creatinine Clearance: 18.2 mL/min (by C-G formula based on SCr of 2.82 mg/dL (H)).    Allergies  Allergen Reactions  . Codeine Nausea And Vomiting  . Sulfa Antibiotics     Antimicrobials this admission: Piperacillin/tazobactam 11/23 >>  Vancomycin 11/23 >>   Dose adjustments this admission:   Microbiology results: 11/23 BCx: pending 11/23  UCx: pending  11/23 Sputum: pending  11/23 MRSA PCR: negative  Thank you for allowing pharmacy to be a part of this patient's care.  Darrow Bussing, PharmD Pharmacy Resident 01/16/2016 6:16 AM

## 2016-01-16 NOTE — Progress Notes (Signed)
Pharmacy Antibiotic Follow-up Note  Heidi Villegas is a 75 y.o. year-old female admitted on 01/14/2016.  The patient is currently on vancomycin and Zosyn.  Assessment/Plan: Lab reports BCID GNR in both bottles of 1 set GNR enterobacteriaceae E Coli KPC not detected. Spoke with Dr. Estanislado Pandy, okay to broaden to meropenem. Reduced for renal function.   Temp (24hrs), Avg:98.8 F (37.1 C), Min:98.8 F (37.1 C), Max:98.8 F (37.1 C)   Recent Labs Lab 12/30/2015 1329 01/16/16 0455  WBC 30.8* 25.7*    Recent Labs Lab 01/12/2016 1329 01/16/16 0455  CREATININE 3.29* 2.82*   Estimated Creatinine Clearance: 18.2 mL/min (by C-G formula based on SCr of 2.82 mg/dL (H)).    Allergies  Allergen Reactions  . Codeine Nausea And Vomiting  . Sulfa Antibiotics    Thank you for allowing pharmacy to be a part of this patient's care.  Laural Benes, Pharm.D., BCPS Clinical Pharmacist 01/16/2016 6:00 AM

## 2016-01-16 NOTE — Progress Notes (Signed)
Pharmacy Antibiotic Note/ Electrolyte Monitoring  Heidi Villegas is a 75 y.o. female admitted on 01/03/2016 with sepsis.  Pharmacy has been consulted for vancomycin and piperacillin/tazobactam dosing. Pharmacy also consulted to assist in electrolyte monitoring  Plan: 1. Antibiotics After discussion with Dr. Ashby Dawes, will d/c vancomycin with negative MRSA PCR and GNR in blood cultures. Meropenem started 500 iv q 12 hours for E coli pending sensitivities. One BCID with Staph no mecA. CCM OK with continuing with meropenem alone for now.   2. Electrolytes No further supplementation warranted at this time. Will f/u AM labs.      Height: 5\' 1"  (154.9 cm) Weight: 200 lb (90.7 kg) IBW/kg (Calculated) : 47.8  Temp (24hrs), Avg:99.8 F (37.7 C), Min:98.5 F (36.9 C), Max:101.1 F (38.4 C)   Recent Labs Lab 01/16/2016 1329 01/16/2016 1628 12/31/2015 1734 12/26/2015 2121 01/16/16 0455 01/16/16 1215  WBC 30.8*  --   --   --  25.7*  --   CREATININE 3.29*  --   --   --  2.82* 3.07*  LATICACIDVEN 3.8* 10.9* 3.5* 2.1* 2.2*  --     Estimated Creatinine Clearance: 16.7 mL/min (by C-G formula based on SCr of 3.07 mg/dL (H)).    Allergies  Allergen Reactions  . Codeine Nausea And Vomiting  . Sulfa Antibiotics     Antimicrobials this admission: Piperacillin/tazobactam 11/23 >> 11/24 Vancomycin 11/23 >> 11/24 Meropenem 11/24 >>  Microbiology results: 11/23 BCx: E coli 2/2, staph 1/2 11/23 UCx: E coli 11/23 Sputum: pending  11/23 MRSA PCR: negative  Thank you for allowing pharmacy to be a part of this patient's care.  Ulice Dash, PharmD Clinical Pharmacist  01/16/2016 4:55 PM

## 2016-01-16 NOTE — Plan of Care (Signed)
Problem: Activity: Goal: Risk for activity intolerance will decrease Outcome: Progressing BP 96/62   Pulse (!) 135   Temp (!) 101.1 F (38.4 C) (Axillary)   Resp (!) 27   Ht 5\' 1"  (1.549 m)   Wt 90.7 kg (200 lb)   SpO2 94%   BMI 37.79 kg/m   Mental Orientation: A&O x 3 disoriented to time Telemetry: Pt placed on monitor. Central tele and Elink aware of pt's transfer Assessment: Completed Skin: wnl  IV: flushes easily, no pain, no blood return right ij placed Pain: no pain  Environmental changes completed to facilitate rest and relaxation.  Safety Measures: Bed alarm obn, 2/4 bed rails up.  Unit Orientation: Pt  oriented to room, has received patient guide, and taught how to use call bell system.    Fine crackles to lung bases, using accessory muscles to breathe. Started on amiodarone drip for the afib. Electrolytes replaced

## 2016-01-16 NOTE — Plan of Care (Addendum)
BMP Latest Ref Rng & Units 01/16/2016 01/16/2016 01/02/2016  Glucose 65 - 99 mg/dL 185(H) 158(H) -  BUN 6 - 20 mg/dL 99(H) 100(H) -  Creatinine 0.44 - 1.00 mg/dL 3.07(H) 2.82(H) -  Sodium 135 - 145 mmol/L 133(L) 132(L) -  Potassium 3.5 - 5.1 mmol/L 4.2 3.2(L) 4.4  Chloride 101 - 111 mmol/L 103 101 -  CO2 22 - 32 mmol/L 17(L) 17(L) -  Calcium 8.9 - 10.3 mg/dL 7.7(L) 8.0(L) -    Lab Results  Component Value Date   CREATININE 3.07 (H) 01/16/2016   CREATININE 2.82 (H) 01/16/2016   CREATININE 3.29 (H) 01/12/2016    Notified Dr. Verdell Carmine about pt having no urine output, bladder scan showed 61 ml., Pt having fine crackles in lung bases, creatinine being elevated.  MD ordered NS @50ml /hr at this time

## 2016-01-16 NOTE — Progress Notes (Signed)
[facility] Progress Note Patient Name: Heidi Villegas DOB: 06/21/1940 MRN: [id number]   Date of Service  01/16/2016  HPI/Events of Note  Call from bedside nurse reporting patient in resp distress along with tachycardia and hypertension.  Has received lopressor IV.  Sats of 93% on NRB.  Had IVFs at 125 cc/hr and pulm congestion on PCXR.  Also with BUN/creat of 101/3.29.  Admitted with UTI/sepsis.  eICU Interventions  Plan: Check labs to see if BUN/creat have improved with IVFs ABG to check acid/base/paO2 May stick feet for labs [facility] IVFs for now Consider BiPAP     Intervention Category Intermediate Interventions: Respiratory distress - evaluation and management  Janace Decker 01/16/2016, 2:34 AM

## 2016-01-16 NOTE — Plan of Care (Signed)
Pt just had 1 urine output unmeasured due to incontinence

## 2016-01-16 NOTE — Consult Note (Signed)
[facility] Cardiology  CARDIOLOGY CONSULT NOTE  Patient ID: Heidi Villegas MRN: [id number] DOB/AGE: 03-21-1940 75 y.o.  Admit date: 01/04/2016 Referring Physician Verdell Carmine Primary Physician [facility] Primary Cardiologist Fath Reason for Consultation Atrial fibrillation  HPI: 75 year old female referred for evaluation of atrial fibrillation and borderline elevated troponin. She presents to [facility] emergency room with one-week history of chronic low back pain, and cough, shortness of breath, increasing confusion. Admission labs were notable for elevated white count of 25,700, with urinalysis consistent with urinary tract infection. Other notable admission labs included markedly elevated BUN and creatinine of 102.82, respectively. Troponin was borderline elevated to 0.26 in the absence of chest pain. The patient was tachycardic, ECG revealed atrial fibrillation with a rapid ventricular rate. Patient denies chest pain or palpitations. She is currently on amiodarone drip with a rate of 110 bpm.  Review of systems complete and found to be negative unless listed above     Past Medical History:  Diagnosis Date  . Arthritis    right hand, lower back  . Hypertension   . Macular degeneration   . Skin cancer     Past Surgical History:  Procedure Laterality Date  . ABDOMINAL HYSTERECTOMY    . BREAST BIOPSY    . BROW LIFT Bilateral 07/30/2014   Procedure: BLEPHAROPLASTY;  Surgeon: Karle Starch, MD;  Location: [place];  Service: Ophthalmology;  Laterality: Bilateral;  COSMETIC BILATERAL UPPER AND LOWER LIDS  . COLONOSCOPY    . WISDOM TOOTH EXTRACTION      Prescriptions Prior to Admission  Medication Sig Dispense Refill Last Dose  . acetaminophen (TYLENOL) 500 MG tablet Take 500 mg by mouth every 6 (six) hours as needed.   prn at prn  . amLODipine (NORVASC) 2.5 MG tablet Take 2.5 mg by mouth daily. AM   01/14/2016 at Unknown time  . aspirin 81 MG tablet Take 81 mg by mouth daily. AM    01/14/2016 at Unknown time  . atorvastatin (LIPITOR) 80 MG tablet Take 1 tablet by mouth daily.   01/14/2016 at Unknown time  . b complex vitamins tablet Take 1 tablet by mouth daily. AM   01/14/2016 at Unknown time  . Cholecalciferol (VITAMIN D-3 PO) Take 1 tablet by mouth daily. AM    01/14/2016 at Unknown time  . CINNAMON PO Take by mouth. AM   01/14/2016 at Unknown time  . metFORMIN (GLUCOPHAGE-XR) 500 MG 24 hr tablet Take 1 tablet by mouth daily.   01/14/2016 at Unknown time  . Multiple Vitamins-Minerals (CENTRUM SILVER PO) Take by mouth. AM   01/14/2016 at Unknown time  . Multiple Vitamins-Minerals (PRESERVISION AREDS PO) Take by mouth. AM   01/14/2016 at Unknown time  . Omega-3 Fatty Acids (OMEGA 3 PO) Take by mouth. AM   01/14/2016 at Unknown time  . potassium chloride SA (K-DUR,KLOR-CON) 20 MEQ tablet Take 1 tablet by mouth daily.   01/14/2016 at Unknown time  . TURMERIC PO Take by mouth.   01/14/2016 at Unknown time   Social History   Social History  . Marital status: Married    Spouse name: N/A  . Number of children: N/A  . Years of education: N/A   Occupational History  . Not on file.   Social History Main Topics  . Smoking status: Never Smoker  . Smokeless tobacco: Never Used  . Alcohol use Yes     Comment: wine 1 glass/month  . Drug use: Unknown  . Sexual activity: Not on file  Other Topics Concern  . Not on file   Social History Narrative  . No narrative on file    No family history on file.    Review of systems complete and found to be negative unless listed above      PHYSICAL EXAM  General: Well developed, well nourished, in no acute distress HEENT:  Normocephalic and atramatic Neck:  No JVD.  Lungs: Clear bilaterally to auscultation and percussion. Heart: HRRR . Normal S1 and S2 without gallops or murmurs.  Abdomen: Bowel sounds are positive, abdomen soft and non-tender  Msk:  Back normal, normal gait. Normal strength and tone for  age. Extremities: No clubbing, cyanosis or edema.   Neuro: Alert and oriented X 3. Psych:  Good affect, responds appropriately  Labs:   Lab Results  Component Value Date   WBC 25.7 (H) 01/16/2016   HGB 13.4 01/16/2016   HCT 39.2 01/16/2016   MCV 84.8 01/16/2016   PLT 101 (L) 01/16/2016    Recent Labs Lab 01/16/16 0455 01/16/16 1215  NA 132* 133*  K 3.2* 4.2  CL 101 103  CO2 17* 17*  BUN 100* 99*  CREATININE 2.82* 3.07*  CALCIUM 8.0* 7.7*  PROT 6.1*  --   BILITOT 3.5*  --   ALKPHOS 262*  --   ALT 114*  --   AST 138*  --   GLUCOSE 158* 185*   Lab Results  Component Value Date   TROPONINI 0.11 ([place]) 01/16/2016   No results found for: CHOL No results found for: HDL No results found for: LDLCALC No results found for: TRIG No results found for: CHOLHDL No results found for: LDLDIRECT    Radiology: US Renal  Result Date: 01/18/2016 CLINICAL DATA:  75 year old female with acute kidney injury and UTI. EXAM: RENAL / URINARY TRACT ULTRASOUND COMPLETE COMPARISON:  None. FINDINGS: Right Kidney: Length: 11.8 cm. A 0.4 x 0.4 x 0.5 cm hyperechoic mass within the right midpole and a 2.2 x 2.3 x 2.3 cm slightly hyperechoic area within the lower pole identified. There is no evidence of hydronephrosis. Echogenicity is within normal limits. 92.5 cm cyst within the lower pole is present. Left Kidney: Length: 13.1 cm. Echogenicity within normal limits. Mild to moderate left hydronephrosis is present. No solid or cystic mass noted. Bladder: Appears normal for degree of bladder distention. IMPRESSION: Mild to moderate left hydronephrosis without obstructing cause identified. 0.5 cm and 2.3 cm possible right renal masses as described above. If no prior outside studies are available for comparison, recommend contrast CT or MRI if clinically able. Electronically Signed   By: Margarette Canada M.D.   On: 01/05/2016 17:45   Dg Chest Port 1 View  Result Date: 01/16/2016 CLINICAL DATA:  Central line  placement EXAM: PORTABLE CHEST 1 VIEW COMPARISON:  12/27/2015 FINDINGS: Shallow inspiration. Cardiac enlargement with central pulmonary vascular congestion. No edema. Atelectasis in the lung bases. No focal consolidation. No blunting of costophrenic angles. Interval placement of a right central venous catheter with tip over the cavoatrial junction region. No pneumothorax. IMPRESSION: Central venous catheter appears in satisfactory position. No pneumothorax. Cardiac enlargement with pulmonary vascular congestion. Shallow inspiration with atelectasis in the lung bases. Electronically Signed   By: Lucienne Capers M.D.   On: 01/16/2016 04:37   Dg Chest Port 1 View  Result Date: 01/06/2016 CLINICAL DATA:  Altered mental status and tachypnea. EXAM: PORTABLE CHEST 1 VIEW COMPARISON:  None. FINDINGS: Cardiomegaly and pulmonary vascular congestion noted. Elevation of the right hemidiaphragm  is present. Mild left lower lung subsegmental atelectasis/ scarring identified. There is no evidence of pneumothorax or definite pleural effusion. No acute bony abnormalities are identified. IMPRESSION: Cardiomegaly with pulmonary vascular congestion. Mild left lower lung subsegmental atelectasis/ scarring. Electronically Signed   By: Margarette Canada M.D.   On: 01/14/2016 14:14    EKG: Atrial fibrillation with a rapid ventricular rate  ASSESSMENT AND PLAN:   1. Atrial fibrillation with a rapid ventricular rate, transitory for urosepsis, dehydration and acute renal failure, ventricular rate reasonably controlled on amiodarone drip 2. Borderline elevated troponin, in the absence of chest pain, likely due to demand supply ischemia, secondary to atrial fibrillation with a rapid ventricular rate, dehydration, acute renal failure with urosepsis 3. Acute renal failure 4. Urosepsis  Recommendations  1. Agree with current therapy 2. Continue amiodarone drip for now 3. Defer full dose anticoagulation at this time 4. Review 2-D  echocardiogram 5. Further recommendations pending echocardiogram results   Signed: Isaias Cowman MD,PhD, [facility] 01/16/2016, 12:43 PM

## 2016-01-17 ENCOUNTER — Inpatient Hospital Stay: Payer: PPO

## 2016-01-17 ENCOUNTER — Inpatient Hospital Stay
Admit: 2016-01-17 | Discharge: 2016-01-17 | Disposition: A | Discharge status: Home / Self Care | Payer: PPO | Attending: Adult Health | Admitting: Adult Health

## 2016-01-17 DIAGNOSIS — N179 Acute kidney failure, unspecified: Secondary | ICD-10-CM

## 2016-01-17 DIAGNOSIS — J9600 Acute respiratory failure, unspecified whether with hypoxia or hypercapnia: Secondary | ICD-10-CM

## 2016-01-17 LAB — BASIC METABOLIC PANEL
Anion gap: 14 (ref 5–15)
Anion gap: 16 — ABNORMAL HIGH (ref 5–15)
BUN: 102 mg/dL — ABNORMAL HIGH (ref 6–20)
BUN: 99 mg/dL — AB (ref 6–20)
CALCIUM: 7 mg/dL — AB (ref 8.9–10.3)
CALCIUM: 7.3 mg/dL — AB (ref 8.9–10.3)
CHLORIDE: 100 mmol/L — AB (ref 101–111)
CO2: 18 mmol/L — AB (ref 22–32)
CO2: 20 mmol/L — AB (ref 22–32)
CREATININE: 3.06 mg/dL — AB (ref 0.44–1.00)
CREATININE: 3.05 mg/dL — AB (ref 0.44–1.00)
Chloride: 98 mmol/L — ABNORMAL LOW (ref 101–111)
GFR calc Af Amer: 16 mL/min — ABNORMAL LOW (ref >60)
GFR calc non Af Amer: 14 mL/min — ABNORMAL LOW (ref >60)
GFR, EST AFRICAN AMERICAN: 16 mL/min — AB (ref >60)
GFR, EST NON AFRICAN AMERICAN: 14 mL/min — AB (ref >60)
GLUCOSE: 245 mg/dL — AB (ref 65–99)
Glucose, Bld: 208 mg/dL — ABNORMAL HIGH (ref 65–99)
Potassium: 3.3 mmol/L — ABNORMAL LOW (ref 3.5–5.1)
Potassium: 3.5 mmol/L (ref 3.5–5.1)
Sodium: 132 mmol/L — ABNORMAL LOW (ref 135–145)
Sodium: 134 mmol/L — ABNORMAL LOW (ref 135–145)

## 2016-01-17 LAB — URINE CULTURE: Culture: >=100000 — AB

## 2016-01-17 LAB — BLOOD GAS, ARTERIAL
ACID-BASE DEFICIT: 7.7 mmol/L — AB (ref 0.0–2.0)
Bicarbonate: 20.1 mmol/L (ref 20.0–28.0)
FIO2: 1
LHR: 12 {breaths}/min
O2 Saturation: 95.1 %
PCO2 ART: 48 mmHg (ref 32.0–48.0)
PEEP: 5 cmH2O
PH ART: 7.23 — AB (ref 7.350–7.450)
Patient temperature: 37
VT: 400 mL
pO2, Arterial: 90 mmHg (ref 83.0–108.0)

## 2016-01-17 LAB — PHOSPHORUS
PHOSPHORUS: 3.5 mg/dL (ref 2.5–4.6)
Phosphorus: 3.4 mg/dL (ref 2.5–4.6)

## 2016-01-17 LAB — GLUCOSE, CAPILLARY
GLUCOSE-CAPILLARY: 203 mg/dL — AB (ref 65–99)
GLUCOSE-CAPILLARY: 233 mg/dL — AB (ref 65–99)
GLUCOSE-CAPILLARY: 270 mg/dL — AB (ref 65–99)
Glucose-Capillary: 187 mg/dL — ABNORMAL HIGH (ref 65–99)
Glucose-Capillary: 205 mg/dL — ABNORMAL HIGH (ref 65–99)

## 2016-01-17 LAB — CBC
HEMATOCRIT: 32.7 % — AB (ref 35.0–47.0)
Hemoglobin: 11 g/dL — ABNORMAL LOW (ref 12.0–16.0)
MCH: 28.3 pg (ref 26.0–34.0)
MCHC: 33.8 g/dL (ref 32.0–36.0)
MCV: 83.7 fL (ref 80.0–100.0)
PLATELETS: 87 10*3/uL — AB (ref 150–440)
RBC: 3.9 MIL/uL (ref 3.80–5.20)
RDW: 15.3 % — AB (ref 11.5–14.5)
WBC: 42.7 10*3/uL — AB (ref 3.6–11.0)

## 2016-01-17 LAB — HEMOGLOBIN A1C
Hgb A1c MFr Bld: 7.5 % — ABNORMAL HIGH (ref 4.8–5.6)
Mean Plasma Glucose: 169 mg/dL

## 2016-01-17 LAB — MAGNESIUM
Magnesium: 2.3 mg/dL (ref 1.7–2.4)
Magnesium: 2.4 mg/dL (ref 1.7–2.4)

## 2016-01-17 LAB — LACTIC ACID, PLASMA
LACTIC ACID, VENOUS: 1.6 mmol/L (ref 0.5–1.9)
LACTIC ACID, VENOUS: 0.9 mmol/L (ref 0.5–1.9)
Lactic Acid, Venous: 1.7 mmol/L (ref 0.5–1.9)

## 2016-01-17 LAB — PROCALCITONIN: PROCALCITONIN: 165.85 ng/mL

## 2016-01-17 LAB — TRIGLYCERIDES: Triglycerides: 150 mg/dL — ABNORMAL HIGH (ref <150)

## 2016-01-17 MED ORDER — VITAL HIGH PROTEIN PO LIQD
1000.0000 mL | ORAL | Status: DC
  Administered 2016-01-17: 1000 mL

## 2016-01-17 MED ORDER — FENTANYL 2500MCG IN NS 250ML (10MCG/ML) PREMIX INFUSION
INTRAVENOUS | Status: AC
  Filled 2016-01-17: qty 250

## 2016-01-17 MED ORDER — NOREPINEPHRINE 4 MG/250ML-% IV SOLN
0.0000 ug/min | INTRAVENOUS | Status: DC
  Administered 2016-01-17: 2 ug/min via INTRAVENOUS
  Administered 2016-01-17: 12 ug/min via INTRAVENOUS
  Administered 2016-01-17: 6 ug/min via INTRAVENOUS
  Administered 2016-01-18: 18 ug/min via INTRAVENOUS
  Administered 2016-01-18 (×3): 20 ug/min via INTRAVENOUS
  Administered 2016-01-18: 18 ug/min via INTRAVENOUS
  Administered 2016-01-19: 14 ug/min via INTRAVENOUS
  Administered 2016-01-19: 12 ug/min via INTRAVENOUS
  Administered 2016-01-19: 10 ug/min via INTRAVENOUS
  Administered 2016-01-20: 2 ug/min via INTRAVENOUS
  Administered 2016-01-20: 7 ug/min via INTRAVENOUS
  Filled 2016-01-17 (×14): qty 250

## 2016-01-17 MED ORDER — MIDAZOLAM HCL 2 MG/2ML IJ SOLN
4.0000 mg | Freq: Once | INTRAMUSCULAR | Status: AC
Start: 2016-01-17 — End: 2016-01-17
  Administered 2016-01-17: 4 mg via INTRAVENOUS
  Filled 2016-01-17: qty 4

## 2016-01-17 MED ORDER — FENTANYL BOLUS VIA INFUSION
25.0000 ug | INTRAVENOUS | Status: DC | PRN
  Administered 2016-01-17 – 2016-01-21 (×2): 25 ug via INTRAVENOUS
  Filled 2016-01-17: qty 25

## 2016-01-17 MED ORDER — VANCOMYCIN HCL IN DEXTROSE 1-5 GM/200ML-% IV SOLN
1000.0000 mg | INTRAVENOUS | Status: DC
  Administered 2016-01-17: 1000 mg via INTRAVENOUS
  Filled 2016-01-17 (×2): qty 200

## 2016-01-17 MED ORDER — ETOMIDATE 2 MG/ML IV SOLN
40.0000 mg | Freq: Once | INTRAVENOUS | Status: AC
  Administered 2016-01-17: 20 mg via INTRAVENOUS

## 2016-01-17 MED ORDER — DEXTROSE 5 % IV SOLN: 0.0000 ug/min | INTRAVENOUS | Status: DC

## 2016-01-17 MED ORDER — PRO-STAT SUGAR FREE PO LIQD
30.0000 mL | Freq: Two times a day (BID) | ORAL | Status: DC
  Administered 2016-01-17: 30 mL

## 2016-01-17 MED ORDER — SODIUM CHLORIDE 0.9 % IV SOLN
3.0000 g | Freq: Once | INTRAVENOUS | Status: AC
  Administered 2016-01-17: 3 g via INTRAVENOUS
  Filled 2016-01-17: qty 3

## 2016-01-17 MED ORDER — SENNOSIDES 8.8 MG/5ML PO SYRP: 5.0000 mL | ORAL_SOLUTION | Freq: Two times a day (BID) | ORAL | Status: DC | PRN

## 2016-01-17 MED ORDER — SODIUM CHLORIDE 0.9 % IV SOLN
30.0000 meq | Freq: Once | INTRAVENOUS | Status: AC
  Administered 2016-01-17: 30 meq via INTRAVENOUS
  Filled 2016-01-17: qty 15

## 2016-01-17 MED ORDER — BISACODYL 10 MG RE SUPP: 10.0000 mg | Freq: Every day | RECTAL | Status: DC | PRN

## 2016-01-17 MED ORDER — PANTOPRAZOLE SODIUM 40 MG IV SOLR
40.0000 mg | Freq: Every day | INTRAVENOUS | Status: DC
  Administered 2016-01-17 – 2016-01-19 (×3): 40 mg via INTRAVENOUS
  Filled 2016-01-17 (×3): qty 40

## 2016-01-17 MED ORDER — FENTANYL CITRATE (PF) 100 MCG/2ML IJ SOLN
50.0000 ug | Freq: Once | INTRAMUSCULAR | Status: AC
Start: 2016-01-17 — End: 2016-01-17

## 2016-01-17 MED ORDER — AMIODARONE HCL IN DEXTROSE 360-4.14 MG/200ML-% IV SOLN
60.0000 mg/h | INTRAVENOUS | Status: AC
  Administered 2016-01-17: 60 mg/h via INTRAVENOUS
  Filled 2016-01-17: qty 200

## 2016-01-17 MED ORDER — PROPOFOL 1000 MG/100ML IV EMUL
INTRAVENOUS | Status: AC
  Filled 2016-01-17: qty 100

## 2016-01-17 MED ORDER — MIDAZOLAM HCL 2 MG/2ML IJ SOLN
1.0000 mg | INTRAMUSCULAR | Status: DC | PRN
  Administered 2016-01-22 – 2016-01-25 (×4): 1 mg via INTRAVENOUS
  Filled 2016-01-17 (×3): qty 2

## 2016-01-17 MED ORDER — CHLORHEXIDINE GLUCONATE 0.12% ORAL RINSE (MEDLINE KIT)
15.0000 mL | Freq: Two times a day (BID) | OROMUCOSAL | Status: DC
  Administered 2016-01-17 – 2016-01-28 (×23): 15 mL via OROMUCOSAL

## 2016-01-17 MED ORDER — AMIODARONE LOAD VIA INFUSION
150.0000 mg | Freq: Once | INTRAVENOUS | Status: AC
  Administered 2016-01-17: 150 mg via INTRAVENOUS

## 2016-01-17 MED ORDER — FENTANYL CITRATE (PF) 100 MCG/2ML IJ SOLN
200.0000 ug | Freq: Once | INTRAMUSCULAR | Status: AC
  Administered 2016-01-17: 200 ug via INTRAVENOUS
  Filled 2016-01-17: qty 4

## 2016-01-17 MED ORDER — ORAL CARE MOUTH RINSE
15.0000 mL | Freq: Four times a day (QID) | OROMUCOSAL | Status: DC
  Administered 2016-01-17 – 2016-01-28 (×45): 15 mL via OROMUCOSAL

## 2016-01-17 MED ORDER — FENTANYL 2500MCG IN NS 250ML (10MCG/ML) PREMIX INFUSION
25.0000 ug/h | INTRAVENOUS | Status: DC
  Administered 2016-01-17: 100 ug/h via INTRAVENOUS

## 2016-01-17 MED ORDER — IOPAMIDOL (ISOVUE-300) INJECTION 61%
15.0000 mL | INTRAVENOUS | Status: AC
Start: 2016-01-17 — End: 2016-01-17
  Administered 2016-01-17 (×2): 15 mL via ORAL

## 2016-01-17 MED ORDER — AMIODARONE LOAD VIA INFUSION
150.0000 mg | Freq: Once | INTRAVENOUS | Status: AC
Start: 2016-01-17 — End: 2016-01-17
  Administered 2016-01-17: 150 mg via INTRAVENOUS
  Filled 2016-01-17: qty 83.34

## 2016-01-17 MED ORDER — MIDAZOLAM HCL 2 MG/2ML IJ SOLN
1.0000 mg | INTRAMUSCULAR | Status: AC | PRN
  Administered 2016-01-21 – 2016-01-22 (×3): 1 mg via INTRAVENOUS
  Filled 2016-01-17 (×2): qty 2

## 2016-01-17 MED ORDER — AMIODARONE HCL IN DEXTROSE 360-4.14 MG/200ML-% IV SOLN
30.0000 mg/h | INTRAVENOUS | Status: DC
  Administered 2016-01-17 – 2016-01-23 (×14): 30 mg/h via INTRAVENOUS
  Filled 2016-01-17 (×13): qty 200

## 2016-01-17 MED ORDER — PROPOFOL 1000 MG/100ML IV EMUL
5.0000 ug/kg/min | INTRAVENOUS | Status: DC
  Administered 2016-01-17 (×2): 40 ug/kg/min via INTRAVENOUS
  Administered 2016-01-17: 50 ug/kg/min via INTRAVENOUS
  Administered 2016-01-17 (×2): 30 ug/kg/min via INTRAVENOUS
  Administered 2016-01-18: 5 ug/kg/min via INTRAVENOUS
  Administered 2016-01-18: 30 ug/kg/min via INTRAVENOUS
  Administered 2016-01-18: 20 ug/kg/min via INTRAVENOUS
  Administered 2016-01-19 (×2): 30 ug/kg/min via INTRAVENOUS
  Administered 2016-01-19: 15 ug/kg/min via INTRAVENOUS
  Administered 2016-01-20: 10 ug/kg/min via INTRAVENOUS
  Filled 2016-01-17 (×10): qty 100

## 2016-01-17 MED ORDER — SODIUM CHLORIDE 0.9 % IV SOLN
INTRAVENOUS | Status: DC
  Administered 2016-01-17 – 2016-01-18 (×3): via INTRAVENOUS
  Administered 2016-01-18: 50 mL/h via INTRAVENOUS
  Administered 2016-01-19: 18:00:00 via INTRAVENOUS

## 2016-01-17 NOTE — Progress Notes (Signed)
[place] Progress Note Patient Name: Heidi Villegas DOB: 1940-10-17 MRN: [id number]   Date of Service  01/17/2016  HPI/Events of Note  Hypoxic- ARDS range BLL consolidation  eICU Interventions  Add PEEP 8     Intervention Category Major Interventions: Respiratory failure - evaluation and management  Shreyan Hinz V. 01/17/2016, 4:29 PM

## 2016-01-17 NOTE — Progress Notes (Signed)
PULMONARY / CRITICAL CARE MEDICINE   Name: Heidi Villegas MRN: [id number] DOB: 1940-05-02    ADMISSION DATE:  01/14/2016    ASSESSMENT / PLAN:  PULMONARY A: Acute hypoxic respiratory failure possibly related to  septic shock-Failed BiPAP and emergently intubated 11/25 due to persistent hypoxemia, tachypnea and tachycardia with worsening mental status  Metabolic acidosis.  P:   Full vent support with current settings ABG and chest x-ray post intubation reviewed, adequate position of endotracheal tube. Nebulized bronchodilators. VAP Protocol Will check ABG, consider Bicarbonate infusion  CARDIOVASCULAR A:  Septic shock Tachycardia Elevated troponin, likely stress related.  Afib with RVR.  P:  Continuous telemetry Trend troponin Continue amlodipine Amiodarone infusion and amiodarone boluses as needed for heart rate greater than 140. 2-D echo Cardiology consult  IV fluids to keep mean arterial blood pressure greater than 65  RENAL A:   AKI possibly related to sepsis-worsening kidney function Left hydronephrosis without obstructing cause identified Right renal mass, we'll need to obtain CT of the abdomen to look for evidence of abscess or other etiology which could be contributing to sepsis. P:   Follow BMET Avoid nephrotoxic drugs Replace electrolytes per ICU protocol CT abdomen pelvis with oral contrast only.  Nephrology consult if kidney function continued to worsen   GASTROINTESTINAL A:   No active issues P:   Nothing by mouth status. Orogastric tube to intermittent suction Protonix for GI prophylaxis  HEMATOLOGIC A:   No active issues P:  Heparin for DVT prophylaxis Transfuse per usual guidelines  INFECTIOUS A:   Urosepsis with septic shock-worsening leukocytosis; blood cultures positive for Escherichia coli, Enterobacteriaceae species, and Staphylococcus species in both aerobic and anaerobic bottles. Final cultures pending. Urine culture positive  for Escherichia coli with a colony count greater than 100,000 P:   Monitor fever curve Continue continue meropenem and start Unasyn if staph aureus.  Follow cultures Trend lactic acid Follow cbc   CULTURES: 11/23 BC; GNR; PCR- enterobacter, Ecoli in both aerobic and anaerobic bottles 11/23 UC; Escherichia coli 11/24 Flu negative MRSA PCR 11/23 negative.   ANTIBIOTICS: 11/23 Vancomycin>>11/24 11/23 Zosyn 11/25 Unasyn  ENDOCRINE A:   Diabetes Melitus P:   BS checks  SSI Coverage  NEUROLOGIC A:   Acute metabolic encephalopathy P:    RASS score -1 to 2  Fentanyl and Versed for vent sedation Monitor mental status with infection resolution   Disposition and family update: [name]'s husband, Mr. Duffell updated via telephone before intubation and after intubation. All questions answered and Support provided.  Case discussed with Dr. Ashby Dawes. Further changes in treatment plan pending patient's clinical course and diagnostics   STUDIES:   11/23 Renal ultrasound>>Mild to moderate left hydronephrosis without obstructing cause identified, right renal mass.  11/25 . 2-D echo pending  SIGNIFICANT EVENTS: 11/23 Patient was admitted with severe sepsis secondary to pylonephritis 11/24 Patient was in severe respiratory distress , requiring BiPAP  LINES/TUBES: 01/16/16 Right IJ>>   -----------------------------------------  CONSULTATION DATE:  01/16/16  REFERRING MD: Dr. Estanislado Pandy  CHIEF COMPLAINT:  Respiratory distress  HISTORY OF PRESENT ILLNESS:   Heidi Villegas is a 75 yo female with PMH significant for Arthritis, Hypertension and DM.  Patient presented to [facility] 11/23 with severe sepsis, elevated lactic acid and altered mental status. On 11/24 patient was  Was noted to be severe respiratory distress along with tachycardia and hypertension.  PCCM team was consulted for further management.  Review of CXR; elevated right diaphragm, slight pulm edema/atelectasis.  ABG  showed  metabolic acidosis, mild lactic acidosis with one spurious value of 10>> 2.1  SUBJECTIVE:  Tachypnea, tachycardic, with declining mental status overnight. ABG showed metabolic acidosis with severe hypoxemia. Patient placed on BiPAP with no significant improvement. This morning, the decision was made to intubate patient due to worsening mental status and respiratory status. Patient intubated emergently. Now sedated  VITAL SIGNS: BP (!) 120/100   Pulse (!) 131   Temp 98.2 F (36.8 C) (Axillary)   Resp 18   Ht 5\' 1"  (1.549 m)   Wt 200 lb (90.7 kg)   SpO2 96%   BMI 37.79 kg/m   HEMODYNAMICS: CVP:  [6 mmHg-9 mmHg] 9 mmHg  VENTILATOR SETTINGS: Vent Mode: PRVC FiO2 (%):  [28 %-100 %] 100 % Set Rate:  [12 bmp] 12 bmp Vt Set:  [400 mL-500 mL] 400 mL PEEP:  [5 cmH20] 5 cmH20  INTAKE / OUTPUT: I/O last 3 completed shifts: In: 4560.3 [P.O.:1350; I.V.:2330.3; IV Piggyback:880] Out: 550 [Urine:550]  PHYSICAL EXAMINATION: General:  Elderly white female in severe respiratory distress Neuro: drowsy but arousable, does not follows command, moves all extremities HEENT:  Atraumatic, normocephalic, no discharge, no JVD appreciated Cardiovascular: Heart rate irregular at 150 bpm, S1, S2, no MRG noted Lungs: Breath sounds diminished in all lung fields, no wheezes or rhonchi Abdomen:  Soft, nontender, active bowel sounds Musculoskeletal:  No inflammation/deformity noted Skin:  Grossly intact  LABS:  BMET  Recent Labs Lab 01/16/16 1215 01/16/16 2348 01/17/16 0450  NA 133* 134* 132*  K 4.2 3.3* 3.5  CL 103 100* 98*  CO2 17* 20* 18*  BUN 99* 99* 102*  CREATININE 3.07* 3.05* 3.06*  GLUCOSE 185* 208* 245*    Electrolytes  Recent Labs Lab 01/16/16 0455 01/16/16 1215 01/16/16 2348 01/17/16 0450  CALCIUM 8.0* 7.7* 7.3* 7.0*  MG 2.3  --  2.4 2.3  PHOS  --   --  3.4 3.5    CBC  Recent Labs Lab 01/21/2016 1329 01/16/16 0455 01/17/16 0450  WBC 30.8* 25.7* 42.7*  HGB  14.1 13.4 11.0*  HCT 41.4 39.2 32.7*  PLT 99* 101* 87*    Coag's No results for input(s): APTT, INR in the last 168 hours.  Sepsis Markers  Recent Labs Lab 01/14/2016 2121 01/16/16 0455 01/16/16 2348  LATICACIDVEN 2.1* 2.2* 1.7    ABG  Recent Labs Lab 01/16/16 0407 01/16/16 2318  PHART 7.36 7.46*  PCO2ART 29* 28*  PO2ART 57* 64*    Liver Enzymes  Recent Labs Lab 12/31/2015 1329 01/16/16 0455  AST 88* 138*  ALT 72* 114*  ALKPHOS 251* 262*  BILITOT 2.7* 3.5*  ALBUMIN 2.2* 2.2*    Cardiac Enzymes  Recent Labs Lab 01/21/2016 2121 01/16/16 0144 01/16/16 0455  TROPONINI 0.14* 0.26* 0.11*    Glucose  Recent Labs Lab 01/14/2016 2241 01/16/16 0845 01/16/16 1211 01/16/16 1633 01/16/16 2049 01/17/16 0729  GLUCAP 175* 142* 173* 227* 213* 203*    Imaging Dg Chest 1 View  Result Date: 01/17/2016 CLINICAL DATA:  75 year old female with shortness of breath. Sepsis. Initial encounter. EXAM: CHEST 1 VIEW COMPARISON:  01/16/2016, levin 05/12/2015. FINDINGS: Portable AP upright view at 0446 hours. Stable right IJ central line. Progressive streaky bilateral infrahilar opacity since 12/26/2015. No pneumothorax. Stable pulmonary vascularity. No definite pleural effusion. Stable cardiac size and mediastinal contours. IMPRESSION: Progressive streaky lung base opacity since 01/16/2016, left greater than right. In the setting of sepsis this could reflect multifocal infection and/or atelectasis. No pleural effusion. Electronically Signed  By: Genevie Ann M.D.   On: 01/17/2016 06:55   Dg Abd 1 View  Result Date: 01/17/2016 CLINICAL DATA:  75 year old female with sepsis. OG tube placement. Initial encounter. EXAM: ABDOMEN - 1 VIEW COMPARISON:  Chest radiographs from today reported separately. FINDINGS: Portable AP supine view at 0702 hours. Enteric tube courses to the left upper quadrant, side hole projects at the level of the diaphragm. There is mild to moderate gaseous distension  of the non dependent distal stomach. Mildly gas distended right colon as well. Streaky opacity at the left lung base. No acute osseous abnormality identified. IMPRESSION: 1. Enteric tube placed, tip at the level of the proximal stomach and side hole up the level of the diaphragm. Recommend advancing 8 cm to ensure side-hole placement within the stomach. 2. Mild to moderate gaseous distension of the stomach, likely related to tube placement. Mild gaseous distension of the visible right colon. Electronically Signed   By: Genevie Ann M.D.   On: 01/17/2016 07:51   Portable Chest Xray  Result Date: 01/17/2016 CLINICAL DATA:  75 year old female with sepsis. Intubated. Initial encounter. EXAM: PORTABLE CHEST 1 VIEW COMPARISON:  0446 hours today and earlier. FINDINGS: Portable AP semi upright view at 0701 hours. Endotracheal tube placed, tip in good position just below the level of the clavicles. Stable right IJ central line. Mildly increased patchy and confluent left greater than right perihilar and lung base opacity since 0446 hours today. Stable cardiac size and mediastinal contours. No pneumothorax. No pleural effusion identified. IMPRESSION: 1. Intubated, endotracheal tube tip in good position just below the level the clavicles. Stable right IJ central line. 2. Mildly increased perihilar and basilar opacity suspicious for bilateral infection. No pleural effusion identified. Electronically Signed   By: Genevie Ann M.D.   On: 01/17/2016 07:48     Magdalene S. [facility] ANP-BC Pulmonary and Critical Care Medicine [facility] Pager [phone] or [phone] 01/17/2016   Patient seen and examined with NP, no reviewed and amended, agree with above. Severe septic shock with acute on chronic renal failure, coming in by atrial fibrillation and acute respiratory failure. The patient was emergently intubated this morning, lungs are clear to auscultation. Lab propofol. This a.m. for sedation. We'll check a CT of the  abdomen and pelvis to look for signs of abscess or other causes of sepsis.   Marda Stalker, M.D.  01/17/2016  Critical Care Attestation.  I have personally obtained a history, examined the patient, evaluated laboratory and imaging results, formulated the assessment and plan and placed orders. The Patient requires high complexity decision making for assessment and support, frequent evaluation and titration of therapies, application of advanced monitoring technologies and extensive interpretation of multiple databases. The patient has critical illness that could lead imminently to failure of 1 or more organ systems and requires the highest level of physician preparedness to intervene.  Critical Care Time devoted to patient care services described in this note is 45 minutes and is exclusive of time spent in procedures.

## 2016-01-17 NOTE — Progress Notes (Signed)
Called MD to see if pt can be placed on Levophed since BP dropped to 70s/50s. Cardiologist at bedside to see pt. Pt on amiodarone gtt as well. Cut back on Propofol sedation. CT contrast administered, will start TF after scan. PO meds held, some unable to be placed in OG, but will hold off until after scan.

## 2016-01-17 NOTE — Progress Notes (Signed)
[facility] Cardiology  SUBJECTIVE: Intubated   Vitals:   01/17/16 0800 01/17/16 0900 01/17/16 1000 01/17/16 1100  BP: 105/66 116/67 (!) 82/52 (!) 77/50  Pulse: (!) 141 (!) 130 (!) 130 (!) 115  Resp: 19 18 13 13   Temp: 100.2 F (37.9 C)     TempSrc: Rectal     SpO2: 96% 96% 96% 95%  Weight:      Height:         Intake/Output Summary (Last 24 hours) at 01/17/16 1140 Last data filed at 01/17/16 0500  Gross per 24 hour  Intake           2527.5 ml  Output                0 ml  Net           2527.5 ml      PHYSICAL EXAM  General: Acutely ill-appearing HEENT:  Normocephalic and atramatic Neck:  No JVD.  Lungs: Clear bilaterally to auscultation and percussion. Heart: Irregularly irregular. Normal S1 and S2 without gallops or murmurs.  Abdomen: Bowel sounds are positive, abdomen soft and non-tender  Msk:  Back normal, normal gait. Normal strength and tone for age. Extremities: No clubbing, cyanosis or edema.   Neuro: Sedated Psych:  Sedated   LABS: Basic Metabolic Panel:  Recent Labs  01/16/16 2348 01/17/16 0450  NA 134* 132*  K 3.3* 3.5  CL 100* 98*  CO2 20* 18*  GLUCOSE 208* 245*  BUN 99* 102*  CREATININE 3.05* 3.06*  CALCIUM 7.3* 7.0*  MG 2.4 2.3  PHOS 3.4 3.5   Liver Function Tests:  Recent Labs  01/08/2016 1329 01/16/16 0455  AST 88* 138*  ALT 72* 114*  ALKPHOS 251* 262*  BILITOT 2.7* 3.5*  PROT 6.3* 6.1*  ALBUMIN 2.2* 2.2*   No results for input(s): LIPASE, AMYLASE in the last 72 hours. CBC:  Recent Labs  01/12/2016 1329 01/16/16 0455 01/17/16 0450  WBC 30.8* 25.7* 42.7*  NEUTROABS 27.8*  --   --   HGB 14.1 13.4 11.0*  HCT 41.4 39.2 32.7*  MCV 84.3 84.8 83.7  PLT 99* 101* 87*   Cardiac Enzymes:  Recent Labs  01/11/2016 2121 01/16/16 0144 01/16/16 0455  TROPONINI 0.14* 0.26* 0.11*   BNP: Invalid input(s): POCBNP D-Dimer: No results for input(s): DDIMER in the last 72 hours. Hemoglobin A1C:  Recent Labs  01/16/16 0455  HGBA1C 7.5*    Fasting Lipid Panel:  Recent Labs  01/17/16 0751  TRIG 150*   Thyroid Function Tests:  Recent Labs  01/16/16 0455  TSH 0.663   Anemia Panel: No results for input(s): VITAMINB12, FOLATE, FERRITIN, TIBC, IRON, RETICCTPCT in the last 72 hours.  Dg Chest 1 View  Result Date: 01/17/2016 CLINICAL DATA:  75 year old female with shortness of breath. Sepsis. Initial encounter. EXAM: CHEST 1 VIEW COMPARISON:  01/16/2016, levin 05/12/2015. FINDINGS: Portable AP upright view at 0446 hours. Stable right IJ central line. Progressive streaky bilateral infrahilar opacity since 01/14/2016. No pneumothorax. Stable pulmonary vascularity. No definite pleural effusion. Stable cardiac size and mediastinal contours. IMPRESSION: Progressive streaky lung base opacity since 12/25/2015, left greater than right. In the setting of sepsis this could reflect multifocal infection and/or atelectasis. No pleural effusion. Electronically Signed   By: Genevie Ann M.D.   On: 01/17/2016 06:55   Dg Abd 1 View  Result Date: 01/17/2016 CLINICAL DATA:  75 year old female with sepsis. OG tube placement. Initial encounter. EXAM: ABDOMEN - 1 VIEW COMPARISON:  Chest  radiographs from today reported separately. FINDINGS: Portable AP supine view at 0702 hours. Enteric tube courses to the left upper quadrant, side hole projects at the level of the diaphragm. There is mild to moderate gaseous distension of the non dependent distal stomach. Mildly gas distended right colon as well. Streaky opacity at the left lung base. No acute osseous abnormality identified. IMPRESSION: 1. Enteric tube placed, tip at the level of the proximal stomach and side hole up the level of the diaphragm. Recommend advancing 8 cm to ensure side-hole placement within the stomach. 2. Mild to moderate gaseous distension of the stomach, likely related to tube placement. Mild gaseous distension of the visible right colon. Electronically Signed   By: Genevie Ann M.D.   On:  01/17/2016 07:51   US Renal  Result Date: 12/25/2015 CLINICAL DATA:  75 year old female with acute kidney injury and UTI. EXAM: RENAL / URINARY TRACT ULTRASOUND COMPLETE COMPARISON:  None. FINDINGS: Right Kidney: Length: 11.8 cm. A 0.4 x 0.4 x 0.5 cm hyperechoic mass within the right midpole and a 2.2 x 2.3 x 2.3 cm slightly hyperechoic area within the lower pole identified. There is no evidence of hydronephrosis. Echogenicity is within normal limits. 92.5 cm cyst within the lower pole is present. Left Kidney: Length: 13.1 cm. Echogenicity within normal limits. Mild to moderate left hydronephrosis is present. No solid or cystic mass noted. Bladder: Appears normal for degree of bladder distention. IMPRESSION: Mild to moderate left hydronephrosis without obstructing cause identified. 0.5 cm and 2.3 cm possible right renal masses as described above. If no prior outside studies are available for comparison, recommend contrast CT or MRI if clinically able. Electronically Signed   By: Margarette Canada M.D.   On: 01/04/2016 17:45   Portable Chest Xray  Result Date: 01/17/2016 CLINICAL DATA:  75 year old female with sepsis. Intubated. Initial encounter. EXAM: PORTABLE CHEST 1 VIEW COMPARISON:  0446 hours today and earlier. FINDINGS: Portable AP semi upright view at 0701 hours. Endotracheal tube placed, tip in good position just below the level of the clavicles. Stable right IJ central line. Mildly increased patchy and confluent left greater than right perihilar and lung base opacity since 0446 hours today. Stable cardiac size and mediastinal contours. No pneumothorax. No pleural effusion identified. IMPRESSION: 1. Intubated, endotracheal tube tip in good position just below the level the clavicles. Stable right IJ central line. 2. Mildly increased perihilar and basilar opacity suspicious for bilateral infection. No pleural effusion identified. Electronically Signed   By: Genevie Ann M.D.   On: 01/17/2016 07:48   Dg Chest  Port 1 View  Result Date: 01/16/2016 CLINICAL DATA:  Central line placement EXAM: PORTABLE CHEST 1 VIEW COMPARISON:  01/19/2016 FINDINGS: Shallow inspiration. Cardiac enlargement with central pulmonary vascular congestion. No edema. Atelectasis in the lung bases. No focal consolidation. No blunting of costophrenic angles. Interval placement of a right central venous catheter with tip over the cavoatrial junction region. No pneumothorax. IMPRESSION: Central venous catheter appears in satisfactory position. No pneumothorax. Cardiac enlargement with pulmonary vascular congestion. Shallow inspiration with atelectasis in the lung bases. Electronically Signed   By: Lucienne Capers M.D.   On: 01/16/2016 04:37   Dg Chest Port 1 View  Result Date: 01/07/2016 CLINICAL DATA:  Altered mental status and tachypnea. EXAM: PORTABLE CHEST 1 VIEW COMPARISON:  None. FINDINGS: Cardiomegaly and pulmonary vascular congestion noted. Elevation of the right hemidiaphragm is present. Mild left lower lung subsegmental atelectasis/ scarring identified. There is no evidence of pneumothorax or definite  pleural effusion. No acute bony abnormalities are identified. IMPRESSION: Cardiomegaly with pulmonary vascular congestion. Mild left lower lung subsegmental atelectasis/ scarring. Electronically Signed   By: Margarette Canada M.D.   On: 01/05/2016 14:14     Echo pending  TELEMETRY: Atrial fibrillation:  ASSESSMENT AND PLAN:  Active Problems:   Septic shock ([place])   Sepsis ([place])   Urinary tract infection without hematuria   Community acquired pneumonia of left lung   Acute renal failure (HCC)   Acute respiratory failure with hypoxia (HCC)   AKI (acute kidney injury) ([place])    1. Atrial fibrillation, with rapid ventricular rate, compensatory for urosepsis and respiratory failure, recently controlled on amiodarone drip 2. Borderline elevated troponin, likely demand supply ischemia 3. Respiratory failure, intubated 4. Acute  renal failure 5. Urosepsis/septic shock  Recommendations  1. Agree with current therapy 2. Continue amiodarone drip 3. Review 2-D echocardiogram 4. Further recommendations pending echocardiogram results   Isaias Cowman, MD, PhD, [facility] 01/17/2016 11:40 AM

## 2016-01-17 NOTE — Progress Notes (Signed)
Spoke with NP regarding patient's temperature dropping to 35.9. Warming blanket applied to patient at this time. Blood pressure has been low, on levo gtt. Sats maintaining on vent. Continue to monitor.

## 2016-01-17 NOTE — Progress Notes (Signed)
Patient intubated, see eMAR for drugs administered. Intubation verification by etCO2, xray. OG placed at 58cm, hooked to low intermitten suction. Patient resting in bed with vital signs WNL. Husband was called by NP prior to intubation. Will continue to monitor until change of shift RN arrives.

## 2016-01-17 NOTE — Progress Notes (Signed)
Brief Nutrition Note  Consult received for enteral/tube feeding initiation and management.  Adult Enteral Nutrition Protocol initiated. Full assessment to follow.  Admitting Dx: Hypokalemia [E87.6] Troponin I above reference range [R74.8] AKI (acute kidney injury) ([place]) [N17.9] Sepsis, due to unspecified organism ([place]) [A41.9] Urinary tract infection without hematuria, site unspecified [N39.0] Acute renal failure, unspecified acute renal failure type ([place]) [N17.9] Community acquired pneumonia of left lung, unspecified part of lung [J18.9]  Body mass index is 37.79 kg/m. Pt meets criteria for Obese based on current BMI.  Labs:   Recent Labs Lab 01/16/16 0455 01/16/16 1215 01/16/16 2348 01/17/16 0450  NA 132* 133* 134* 132*  K 3.2* 4.2 3.3* 3.5  CL 101 103 100* 98*  CO2 17* 17* 20* 18*  BUN 100* 99* 99* 102*  CREATININE 2.82* 3.07* 3.05* 3.06*  CALCIUM 8.0* 7.7* 7.3* 7.0*  MG 2.3  --  2.4 2.3  PHOS  --   --  3.4 3.5  GLUCOSE 158* 185* 208* 245*    Burtis Junes RD, LDN, CNSC Clinical Nutrition Pager: [phone] 01/17/2016 7:38 AM

## 2016-01-17 NOTE — Procedures (Signed)
Intubation Procedure Note NARYAH PLATT [id number] 02-28-1940  Procedure: Intubation Indications: Respiratory insufficiency  Procedure Details Consent: Risks of procedure as well as the alternatives and risks of each were explained to the (patient/caregiver).  Consent for procedure obtained. Time Out: Verified patient identification, verified procedure, site/side was marked, verified correct patient position, special equipment/implants available, medications/allergies/relevent history reviewed, required imaging and test results available.  Performed  Maximum sterile technique was used including antiseptics, cap, gloves, gown, hand hygiene, mask and sheet.  4    Evaluation Hemodynamic Status: BP stable throughout; O2 sats: stable throughout Patient's Current Condition: stable Complications: No apparent complications Patient did tolerate procedure well. Chest X-ray ordered to verify placement.  CXR: tube position acceptable.  Procedure performed under direct supervision of Dr.Octave Montrose. Glidoscope utilized for real-time airway visualization and intubation  [facility] ANP-BC Pulmonary and Critical Care Medicine [facility] Pager [phone] or [phone] 01/17/2016

## 2016-01-17 NOTE — Progress Notes (Signed)
Pharmacy Antibiotic Note  Heidi Villegas is a 75 y.o. female admitted on 01/07/2016 with sepsis.  Pharmacy has been consulted for vancomycin dosing. Per MD, wanting broader coverage in pt with worsening respiratory status (now intubated) and increasing WBC. Culture sensitivities still pending.   Plan: Will resume vancomycin 1000 mg IV q48h with next dose due tonight at 2300 (continuation of previous regimen).   Height: 5\' 1"  (154.9 cm) Weight: 200 lb (90.7 kg) IBW/kg (Calculated) : 47.8  Temp (24hrs), Avg:98.9 F (37.2 C), Min:98.2 F (36.8 C), Max:100.2 F (37.9 C)   Recent Labs Lab 01/17/2016 1329  01/22/2016 1734 01/20/2016 2121 01/16/16 0455 01/16/16 1215 01/16/16 2348 01/17/16 0450 01/17/16 0751  WBC 30.8*  --   --   --  25.7*  --   --  42.7*  --   CREATININE 3.29*  --   --   --  2.82* 3.07* 3.05* 3.06*  --   LATICACIDVEN 3.8*  < > 3.5* 2.1* 2.2*  --  1.7  --  1.6  < > = values in this interval not displayed.  Estimated Creatinine Clearance: 16.8 mL/min (by C-G formula based on SCr of 3.06 mg/dL (H)).    Allergies  Allergen Reactions  . Codeine Nausea And Vomiting  . Sulfa Antibiotics     Antimicrobials this admission: Zosyn 11/23 >> 11/24 Vancomycin 11/23 >> 11/24, 11/25 >> Meropenem 11/24 >>     Thank you for allowing pharmacy to be a part of this patient's care.  Rocky Morel 01/17/2016 12:31 PM

## 2016-01-17 NOTE — Progress Notes (Signed)
Pt. Transferred to Intensivists Service as she was intubated this morning.

## 2016-01-17 NOTE — Progress Notes (Signed)
Spoke with NP about patient's heart rate being elevated with Atrial Fib on monitor. Patients blood pressure is within normal limits. Patient is otherwise tolerating the rate. Received new orders, see eMAR. Will continue to assess and monitor patient.

## 2016-01-17 NOTE — Progress Notes (Signed)
Pharmacy Antibiotic Note/ Electrolyte Monitoring  Heidi Villegas is a 75 y.o. female admitted on 01/06/2016 with sepsis.  Pharmacy has been consulted for meropenem dosing. Pharmacy also consulted to assist in electrolyte monitoring  This is day #3 of antibiotics (day #2 of meropenem)  Plan: 1. Antibiotics Continue meropenem 500 mg IV q 12 hours for E coli.  Urine sensitivities resulted, WBC inreasing - paged MD. Pharmacy awaiting return page.  2. Electrolytes No further supplementation warranted at this time. Will f/u AM labs.    Height: 5\' 1"  (154.9 cm) Weight: 200 lb (90.7 kg) IBW/kg (Calculated) : 47.8  Temp (24hrs), Avg:98.4 F (36.9 C), Min:98.2 F (36.8 C), Max:98.5 F (36.9 C)   Recent Labs Lab 01/04/2016 1329  01/16/2016 1734 01/07/2016 2121 01/16/16 0455 01/16/16 1215 01/16/16 2348 01/17/16 0450 01/17/16 0751  WBC 30.8*  --   --   --  25.7*  --   --  42.7*  --   CREATININE 3.29*  --   --   --  2.82* 3.07* 3.05* 3.06*  --   LATICACIDVEN 3.8*  < > 3.5* 2.1* 2.2*  --  1.7  --  1.6  < > = values in this interval not displayed.  Estimated Creatinine Clearance: 16.8 mL/min (by C-G formula based on SCr of 3.06 mg/dL (H)).    Allergies  Allergen Reactions  . Codeine Nausea And Vomiting  . Sulfa Antibiotics     Antimicrobials this admission: Piperacillin/tazobactam 11/23 >> 11/24 Vancomycin 11/23 >> 11/24 Meropenem 11/24 >>  Microbiology results: 11/23 BCx: E coli 2/2, staph 1/2, pending sensitivities 11/23 UCx: E coli resistant to ampicillin and amp/sulbactam 11/23 Sputum: pending  11/23 MRSA PCR: negative  Thank you for allowing pharmacy to be a part of this patient's care.  Lenis Noon, PharmD, BCPS Clinical Pharmacist  01/17/2016 8:50 AM

## 2016-01-18 ENCOUNTER — Inpatient Hospital Stay: Payer: PPO | Admitting: Anesthesiology

## 2016-01-18 ENCOUNTER — Inpatient Hospital Stay: Payer: PPO

## 2016-01-18 ENCOUNTER — Encounter: Admission: EM | Disposition: E | Payer: Self-pay | Source: Home / Self Care | Attending: Internal Medicine

## 2016-01-18 DIAGNOSIS — A419 Sepsis, unspecified organism: Secondary | ICD-10-CM

## 2016-01-18 DIAGNOSIS — N12 Tubulo-interstitial nephritis, not specified as acute or chronic: Secondary | ICD-10-CM

## 2016-01-18 DIAGNOSIS — N132 Hydronephrosis with renal and ureteral calculous obstruction: Secondary | ICD-10-CM

## 2016-01-18 HISTORY — PX: CYSTOSCOPY WITH STENT PLACEMENT: SHX5790

## 2016-01-18 LAB — GLUCOSE, CAPILLARY
GLUCOSE-CAPILLARY: 291 mg/dL — AB (ref 65–99)
GLUCOSE-CAPILLARY: 322 mg/dL — AB (ref 65–99)
GLUCOSE-CAPILLARY: 264 mg/dL — AB (ref 65–99)
Glucose-Capillary: 311 mg/dL — ABNORMAL HIGH (ref 65–99)
Glucose-Capillary: 316 mg/dL — ABNORMAL HIGH (ref 65–99)
Glucose-Capillary: 335 mg/dL — ABNORMAL HIGH (ref 65–99)

## 2016-01-18 LAB — BLOOD GAS, ARTERIAL
ACID-BASE DEFICIT: 6.8 mmol/L — AB (ref 0.0–2.0)
ACID-BASE DEFICIT: 5.5 mmol/L — AB (ref 0.0–2.0)
BICARBONATE: 23.3 mmol/L (ref 20.0–28.0)
BICARBONATE: 21.1 mmol/L (ref 20.0–28.0)
FIO2: 1
FIO2: 1
MECHANICAL RATE: 30
MECHVT: 400 mL
MECHVT: 400 mL
O2 SAT: 84.4 %
O2 Saturation: 76.4 %
PATIENT TEMPERATURE: 37
PCO2 ART: 45 mmHg (ref 32.0–48.0)
PEEP/CPAP: 5 cmH2O
PEEP/CPAP: 5 cmH2O
PH ART: 7.28 — AB (ref 7.350–7.450)
PO2 ART: 56 mmHg — AB (ref 83.0–108.0)
Patient temperature: 37
RATE: 30 resp/min
pCO2 arterial: 70 mmHg (ref 32.0–48.0)
pH, Arterial: 7.13 — CL (ref 7.350–7.450)
pO2, Arterial: 55 mmHg — ABNORMAL LOW (ref 83.0–108.0)

## 2016-01-18 LAB — CBC
HCT: 32.3 % — ABNORMAL LOW (ref 35.0–47.0)
HEMOGLOBIN: 10.9 g/dL — AB (ref 12.0–16.0)
MCH: 28.5 pg (ref 26.0–34.0)
MCHC: 33.8 g/dL (ref 32.0–36.0)
MCV: 84.4 fL (ref 80.0–100.0)
PLATELETS: 145 10*3/uL — AB (ref 150–440)
RBC: 3.83 MIL/uL (ref 3.80–5.20)
RDW: 16.2 % — AB (ref 11.5–14.5)
WBC: 49.3 10*3/uL — ABNORMAL HIGH (ref 3.6–11.0)

## 2016-01-18 LAB — BASIC METABOLIC PANEL
Anion gap: 13 (ref 5–15)
Anion gap: 16 — ABNORMAL HIGH (ref 5–15)
BUN: 99 mg/dL — AB (ref 6–20)
BUN: 98 mg/dL — AB (ref 6–20)
CALCIUM: 6.3 mg/dL — AB (ref 8.9–10.3)
CALCIUM: 6.3 mg/dL — AB (ref 8.9–10.3)
CO2: 23 mmol/L (ref 22–32)
CO2: 21 mmol/L — ABNORMAL LOW (ref 22–32)
CREATININE: 3.49 mg/dL — AB (ref 0.44–1.00)
CREATININE: 3.65 mg/dL — AB (ref 0.44–1.00)
Chloride: 91 mmol/L — ABNORMAL LOW (ref 101–111)
Chloride: 90 mmol/L — ABNORMAL LOW (ref 101–111)
GFR calc non Af Amer: 11 mL/min — ABNORMAL LOW (ref >60)
GFR, EST AFRICAN AMERICAN: 14 mL/min — AB (ref >60)
GFR, EST AFRICAN AMERICAN: 13 mL/min — AB (ref >60)
GFR, EST NON AFRICAN AMERICAN: 12 mL/min — AB (ref >60)
Glucose, Bld: 326 mg/dL — ABNORMAL HIGH (ref 65–99)
Glucose, Bld: 322 mg/dL — ABNORMAL HIGH (ref 65–99)
Potassium: 4.2 mmol/L (ref 3.5–5.1)
Potassium: 3.8 mmol/L (ref 3.5–5.1)
SODIUM: 127 mmol/L — AB (ref 135–145)
SODIUM: 127 mmol/L — AB (ref 135–145)

## 2016-01-18 LAB — PROCALCITONIN: Procalcitonin: 158.93 ng/mL

## 2016-01-18 LAB — CULTURE, BLOOD (ROUTINE X 2)

## 2016-01-18 LAB — MAGNESIUM: MAGNESIUM: 2.4 mg/dL (ref 1.7–2.4)

## 2016-01-18 LAB — PHOSPHORUS: PHOSPHORUS: 7.6 mg/dL — AB (ref 2.5–4.6)

## 2016-01-18 SURGERY — CYSTOSCOPY, WITH STENT INSERTION
Anesthesia: General | Laterality: Left | Wound class: Clean Contaminated

## 2016-01-18 MED ORDER — ROCURONIUM BROMIDE 100 MG/10ML IV SOLN: INTRAVENOUS | Status: DC | PRN

## 2016-01-18 MED ORDER — PHENYLEPHRINE HCL 10 MG/ML IJ SOLN
INTRAMUSCULAR | Status: DC | PRN
  Administered 2016-01-18 (×4): 100 ug via INTRAVENOUS

## 2016-01-18 MED ORDER — AMIODARONE HCL IN DEXTROSE 360-4.14 MG/200ML-% IV SOLN
INTRAVENOUS | Status: DC | PRN
  Administered 2016-01-18: 30 mg/h via INTRAVENOUS

## 2016-01-18 MED ORDER — VITAL HIGH PROTEIN PO LIQD: 1000.0000 mL | ORAL | Status: DC

## 2016-01-18 MED ORDER — NOREPINEPHRINE BITARTRATE 1 MG/ML IV SOLN
INTRAVENOUS | Status: DC | PRN
  Administered 2016-01-18: 18 ug/min via INTRAVENOUS

## 2016-01-18 MED ORDER — MIDAZOLAM HCL 2 MG/2ML IJ SOLN
INTRAMUSCULAR | Status: DC | PRN
  Administered 2016-01-18: 1 mg via INTRAVENOUS

## 2016-01-18 MED ORDER — SODIUM CHLORIDE 0.9 % IV SOLN
1.0000 g | Freq: Once | INTRAVENOUS | Status: AC
  Administered 2016-01-18: 1 g via INTRAVENOUS
  Filled 2016-01-18: qty 10

## 2016-01-18 MED ORDER — SODIUM CHLORIDE 0.9% FLUSH
10.0000 mL | INTRAVENOUS | Status: DC | PRN
Start: 2016-01-18 — End: 2016-01-28

## 2016-01-18 MED ORDER — MIDAZOLAM HCL 2 MG/2ML IJ SOLN: INTRAMUSCULAR | Status: DC | PRN

## 2016-01-18 MED ORDER — SODIUM CHLORIDE 0.9% FLUSH
10.0000 mL | Freq: Two times a day (BID) | INTRAVENOUS | Status: DC
  Administered 2016-01-18: 30 mL
  Administered 2016-01-21: 10 mL
  Administered 2016-01-24: 40 mL
  Administered 2016-01-25: 20 mL
  Administered 2016-01-26 – 2016-01-28 (×4): 10 mL

## 2016-01-18 MED ORDER — ROCURONIUM BROMIDE 100 MG/10ML IV SOLN
INTRAVENOUS | Status: DC | PRN
  Administered 2016-01-18: 30 mg via INTRAVENOUS

## 2016-01-18 MED ORDER — INSULIN ASPART 100 UNIT/ML ~~LOC~~ SOLN
0.0000 [IU] | SUBCUTANEOUS | Status: DC
  Administered 2016-01-18: 15 [IU] via SUBCUTANEOUS
  Administered 2016-01-19: 11 [IU] via SUBCUTANEOUS
  Administered 2016-01-19: 15 [IU] via SUBCUTANEOUS
  Administered 2016-01-19: 11 [IU] via SUBCUTANEOUS
  Filled 2016-01-18 (×2): qty 11
  Filled 2016-01-18 (×2): qty 15

## 2016-01-18 MED ORDER — VITAL HIGH PROTEIN PO LIQD
1000.0000 mL | ORAL | Status: DC
  Administered 2016-01-18 – 2016-01-19 (×2): 1000 mL
  Administered 2016-01-19 – 2016-01-20 (×7)
  Administered 2016-01-20: 1000 mL
  Administered 2016-01-20 (×2)
  Administered 2016-01-20: 1000 mL
  Administered 2016-01-20 – 2016-01-21 (×10)
  Administered 2016-01-21: 1000 mL
  Administered 2016-01-21: 12:00:00
  Administered 2016-01-21: 1000 mL
  Administered 2016-01-21: 08:00:00
  Administered 2016-01-22 – 2016-01-23 (×3): 1000 mL

## 2016-01-18 SURGICAL SUPPLY — 21 items
BAG DRAIN CYSTO-URO LG1000N (MISCELLANEOUS) ×3 IMPLANT
CATH FOL 2WAY LX 16X30 (CATHETERS) ×3 IMPLANT
CATH URETL 5X70 OPEN END (CATHETERS) ×3 IMPLANT
CATH URETL OPEN END 6X70 (CATHETERS) IMPLANT
GLOVE BIO SURGEON STRL SZ7.5 (GLOVE) ×3 IMPLANT
GLOVE BIO SURGEON STRL SZ8 (GLOVE) ×3 IMPLANT
GOWN STRL REUS W/ TWL LRG LVL4 (GOWN DISPOSABLE) ×1 IMPLANT
GOWN STRL REUS W/ TWL XL LVL3 (GOWN DISPOSABLE) ×1 IMPLANT
GOWN STRL REUS W/TWL LRG LVL4 (GOWN DISPOSABLE) ×2
GOWN STRL REUS W/TWL XL LVL3 (GOWN DISPOSABLE) ×2
GUIDEWIRE STR ZIPWIRE 035X150 (MISCELLANEOUS) ×3 IMPLANT
HOLDER FOLEY CATH W/STRAP (MISCELLANEOUS) ×3 IMPLANT
PACK CYSTO AR (MISCELLANEOUS) ×3 IMPLANT
PREP PVP WINGED SPONGE (MISCELLANEOUS) ×3 IMPLANT
SET CYSTO W/LG BORE CLAMP LF (SET/KITS/TRAYS/PACK) ×3 IMPLANT
SOL .9 NS 3000ML IRR  AL (IV SOLUTION) ×2
SOL .9 NS 3000ML IRR UROMATIC (IV SOLUTION) ×1 IMPLANT
STENT URET 6FRX24 CONTOUR (STENTS) ×3 IMPLANT
SYR TOOMEY 50ML (SYRINGE) ×3 IMPLANT
SYRINGE 10CC LL (SYRINGE) ×3 IMPLANT
WATER STERILE IRR 1000ML POUR (IV SOLUTION) ×3 IMPLANT

## 2016-01-18 NOTE — Progress Notes (Signed)
HD STARTED  

## 2016-01-18 NOTE — Procedures (Signed)
Central Venous Dailysis Catheter Placement: Indication: Hemo Dialysis/CRRT   Consent:in chart  Risks and benefits explained in detail including risk of infection, bleeding, respiratory failure and death..   Hand washing performed prior to starting the procedure.   Procedure: An active timeout was performed and correct patient, name, & ID confirmed.  After explaining risk and benefits, patient was positioned correctly for central venous access. Patient was prepped using strict sterile technique including chlorohexadine preps, sterile drape, sterile gown and sterile gloves.  The area was prepped, draped and anesthetized in the usual sterile manner. Patient comfort was ensured.   A dual lumen HD catheter was placed in left femoral Vein after dual dilation.  There was good blood return, catheter caps were placed on lumens, catheter flushed easily, the line was secured and a sterile dressing and BIO-PATCH applied.   Ultrasound was used to visualize vasculature and guidance of needle.   Number of Attempts: 1 Complications:none  Estimated Blood Loss: 20cc .   Marda Stalker, M.D. 12/28/2015

## 2016-01-18 NOTE — Progress Notes (Signed)
HD COMPLETED  

## 2016-01-18 NOTE — Consult Note (Addendum)
Consult: left ureteral stone, hydro and sepsis Requested by: Arlyss Gandy. Tukov ANP-BC  History of Present Illness: 75 yo admitted with AMS changes, sepsis and UTI. U/S 11/22 showed left hydro and CT today showed 6 mm left proximal stone and hydro. She is brought for urgent stent. She has declined over past few days from CV/pulm standpoint, but has cleared her LA. Her wbc went up to 43. I spoke with her husband who reports pt felt bad at home for almost a week before they could get her to come to hospital.   Past Medical History:  Diagnosis Date  . Arthritis    right hand, lower back  . Hypertension   . Macular degeneration   . Skin cancer    Past Surgical History:  Procedure Laterality Date  . ABDOMINAL HYSTERECTOMY    . BREAST BIOPSY    . BROW LIFT Bilateral 07/30/2014   Procedure: BLEPHAROPLASTY;  Surgeon: Karle Starch, MD;  Location: [facility];  Service: Ophthalmology;  Laterality: Bilateral;  COSMETIC BILATERAL UPPER AND LOWER LIDS  . COLONOSCOPY    . WISDOM TOOTH EXTRACTION      Home Medications:  Prescriptions Prior to Admission  Medication Sig Dispense Refill Last Dose  . acetaminophen (TYLENOL) 500 MG tablet Take 500 mg by mouth every 6 (six) hours as needed.   prn at prn  . amLODipine (NORVASC) 2.5 MG tablet Take 2.5 mg by mouth daily. AM   01/14/2016 at Unknown time  . aspirin 81 MG tablet Take 81 mg by mouth daily. AM   01/14/2016 at Unknown time  . atorvastatin (LIPITOR) 80 MG tablet Take 1 tablet by mouth daily.   01/14/2016 at Unknown time  . b complex vitamins tablet Take 1 tablet by mouth daily. AM   01/14/2016 at Unknown time  . Cholecalciferol (VITAMIN D-3 PO) Take 1 tablet by mouth daily. AM    01/14/2016 at Unknown time  . CINNAMON PO Take by mouth. AM   01/14/2016 at Unknown time  . metFORMIN (GLUCOPHAGE-XR) 500 MG 24 hr tablet Take 1 tablet by mouth daily.   01/14/2016 at Unknown time  . Multiple Vitamins-Minerals (CENTRUM SILVER PO) Take by mouth. AM    01/14/2016 at Unknown time  . Multiple Vitamins-Minerals (PRESERVISION AREDS PO) Take by mouth. AM   01/14/2016 at Unknown time  . Omega-3 Fatty Acids (OMEGA 3 PO) Take by mouth. AM   01/14/2016 at Unknown time  . potassium chloride SA (K-DUR,KLOR-CON) 20 MEQ tablet Take 1 tablet by mouth daily.   01/14/2016 at Unknown time  . TURMERIC PO Take by mouth.   01/14/2016 at Unknown time   Allergies:  Allergies  Allergen Reactions  . Codeine Nausea And Vomiting  . Sulfa Antibiotics     No family history on file. Social History:  reports that she has never smoked. She has never used smokeless tobacco. She reports that she drinks alcohol. Her drug history is not on file.  ROS: A complete review of systems was performed.  All systems are negative except for pertinent findings as noted. Review of Systems  Unable to perform ROS: Intubated     Physical Exam:  Vital signs in last 24 hours: Temp:  [95.7 F (35.4 C)-100.2 F (37.9 C)] 97.2 F (36.2 C) (11/25 2200) Pulse Rate:  [99-149] 99 (11/25 1900) Resp:  [12-33] 13 (11/25 1900) BP: (71-191)/(47-100) 93/58 (11/25 1900) SpO2:  [89 %-100 %] 94 % (11/25 2024) FiO2 (%):  [35 %-100 %] 100 % (  11/25 2024) General:  Intubated and sedated on vent  HEENT: Normocephalic, atraumatic Neck: No JVD or lymphadenopathy Cardiovascular: tachy - 111  Lungs: intubated  Extremities: No edema Neurologic: sedated   Laboratory Data:  Results for orders placed or performed during the hospital encounter of 12/27/2015 (from the past 24 hour(s))  Magnesium     Status: None   Collection Time: 01/17/16  4:50 AM  Result Value Ref Range   Magnesium 2.3 1.7 - 2.4 mg/dL  Phosphorus     Status: None   Collection Time: 01/17/16  4:50 AM  Result Value Ref Range   Phosphorus 3.5 2.5 - 4.6 mg/dL  CBC     Status: Abnormal   Collection Time: 01/17/16  4:50 AM  Result Value Ref Range   WBC 42.7 (H) 3.6 - 11.0 K/uL   RBC 3.90 3.80 - 5.20 MIL/uL   Hemoglobin 11.0  (L) 12.0 - 16.0 g/dL   HCT 32.7 (L) 35.0 - 47.0 %   MCV 83.7 80.0 - 100.0 fL   MCH 28.3 26.0 - 34.0 pg   MCHC 33.8 32.0 - 36.0 g/dL   RDW 15.3 (H) 11.5 - 14.5 %   Platelets 87 (L) 150 - 440 K/uL  Basic metabolic panel     Status: Abnormal   Collection Time: 01/17/16  4:50 AM  Result Value Ref Range   Sodium 132 (L) 135 - 145 mmol/L   Potassium 3.5 3.5 - 5.1 mmol/L   Chloride 98 (L) 101 - 111 mmol/L   CO2 18 (L) 22 - 32 mmol/L   Glucose, Bld 245 (H) 65 - 99 mg/dL   BUN 102 (H) 6 - 20 mg/dL   Creatinine, Ser 3.06 (H) 0.44 - 1.00 mg/dL   Calcium 7.0 (L) 8.9 - 10.3 mg/dL   GFR calc non Af Amer 14 (L) >60 mL/min   GFR calc Af Amer 16 (L) >60 mL/min   Anion gap 16 (H) 5 - 15  Glucose, capillary     Status: Abnormal   Collection Time: 01/17/16  7:29 AM  Result Value Ref Range   Glucose-Capillary 203 (H) 65 - 99 mg/dL  Lactic acid, plasma     Status: None   Collection Time: 01/17/16  7:51 AM  Result Value Ref Range   Lactic Acid, Venous 1.6 0.5 - 1.9 mmol/L  Procalcitonin - Baseline     Status: None   Collection Time: 01/17/16  7:51 AM  Result Value Ref Range   Procalcitonin 165.85 ng/mL  Triglycerides     Status: Abnormal   Collection Time: 01/17/16  7:51 AM  Result Value Ref Range   Triglycerides 150 (H) <150 mg/dL  Blood gas, arterial     Status: Abnormal   Collection Time: 01/17/16  9:10 AM  Result Value Ref Range   FIO2 1.00    Mode PRESSURE REGULATED VOLUME CONTROL    VT 400 mL   LHR 12 resp/min   Peep/cpap 5.0 cm H20   pH, Arterial 7.23 (L) 7.350 - 7.450   pCO2 arterial 48 32.0 - 48.0 mmHg   pO2, Arterial 90 83.0 - 108.0 mmHg   Bicarbonate 20.1 20.0 - 28.0 mmol/L   Acid-base deficit 7.7 (H) 0.0 - 2.0 mmol/L   O2 Saturation 95.1 %   Patient temperature 37.0    Collection site REVIEWED BY    Sample type ARTERIAL DRAW    Allens test (pass/fail) PASS PASS  Glucose, capillary     Status: Abnormal   Collection Time: 01/17/16  11:42 AM  Result Value Ref Range    Glucose-Capillary 187 (H) 65 - 99 mg/dL  Glucose, capillary     Status: Abnormal   Collection Time: 01/17/16  4:16 PM  Result Value Ref Range   Glucose-Capillary 205 (H) 65 - 99 mg/dL  Lactic acid, plasma     Status: None   Collection Time: 01/17/16  4:51 PM  Result Value Ref Range   Lactic Acid, Venous 0.9 0.5 - 1.9 mmol/L  Glucose, capillary     Status: Abnormal   Collection Time: 01/17/16  7:38 PM  Result Value Ref Range   Glucose-Capillary 233 (H) 65 - 99 mg/dL   Comment 1 Notify RN   Glucose, capillary     Status: Abnormal   Collection Time: 01/17/16 11:32 PM  Result Value Ref Range   Glucose-Capillary 270 (H) 65 - 99 mg/dL   Comment 1 Notify RN    Recent Results (from the past 240 hour(s))  Urine culture     Status: Abnormal   Collection Time: 01/17/2016  1:29 PM  Result Value Ref Range Status   Specimen Description URINE, RANDOM  Final   Special Requests NONE  Final   Culture >=100,000 COLONIES/mL ESCHERICHIA COLI (A)  Final   Report Status 01/17/2016 FINAL  Final   Organism ID, Bacteria ESCHERICHIA COLI (A)  Final      Susceptibility   Escherichia coli - MIC*    AMPICILLIN >=32 RESISTANT Resistant     CEFAZOLIN <=4 SENSITIVE Sensitive     CEFTRIAXONE <=1 SENSITIVE Sensitive     CIPROFLOXACIN <=0.25 SENSITIVE Sensitive     GENTAMICIN <=1 SENSITIVE Sensitive     IMIPENEM <=0.25 SENSITIVE Sensitive     NITROFURANTOIN <=16 SENSITIVE Sensitive     TRIMETH/SULFA <=20 SENSITIVE Sensitive     AMPICILLIN/SULBACTAM >=32 RESISTANT Resistant     PIP/TAZO <=4 SENSITIVE Sensitive     Extended ESBL NEGATIVE Sensitive     * >=100,000 COLONIES/mL ESCHERICHIA COLI  Culture, blood (routine x 2)     Status: Abnormal (Preliminary result)   Collection Time: 01/17/2016  1:29 PM  Result Value Ref Range Status   Specimen Description BLOOD RIGHT HAND  Final   Special Requests   Final    BOTTLES DRAWN AEROBIC AND ANAEROBIC 11MLAERO,8MLANA   Culture  Setup Time   Final    GRAM NEGATIVE  RODS IN BOTH AEROBIC AND ANAEROBIC BOTTLES CRITICAL RESULT CALLED TO, READ BACK BY AND VERIFIED WITH: NATE COOKSON AT [id number] ON 01/16/16 [place]. Performed at [facility] (A)  Final   Report Status PENDING  Incomplete  Culture, blood (routine x 2)     Status: Abnormal (Preliminary result)   Collection Time: 12/24/2015  1:29 PM  Result Value Ref Range Status   Specimen Description BLOOD LEFT ANTECUBITAL  Final   Special Requests   Final    BOTTLES DRAWN AEROBIC AND ANAEROBIC AER 12ML ANA 11ML   Culture  Setup Time   Final    GRAM NEGATIVE RODS IN BOTH AEROBIC AND ANAEROBIC BOTTLES CRITICAL VALUE NOTED.  VALUE IS CONSISTENT WITH PREVIOUSLY REPORTED AND CALLED VALUE. GRAM POSITIVE COCCI AEROBIC BOTTLE ONLY CRITICAL RESULT CALLED TO, READ BACK BY AND VERIFIED WITH: Charlane Ferretti @ [id number] 01/16/16 by [facility].    Culture (A)  Final    ESCHERICHIA COLI GRAM POSITIVE COCCI CULTURE REINCUBATED FOR BETTER GROWTH Performed at [facility]    Report Status PENDING  Incomplete  Blood  Culture ID Panel (Reflexed)     Status: Abnormal   Collection Time: 12/28/2015  1:29 PM  Result Value Ref Range Status   Enterococcus species NOT DETECTED NOT DETECTED Final   Listeria monocytogenes NOT DETECTED NOT DETECTED Final   Staphylococcus species NOT DETECTED NOT DETECTED Final   Staphylococcus aureus NOT DETECTED NOT DETECTED Final   Streptococcus species NOT DETECTED NOT DETECTED Final   Streptococcus agalactiae NOT DETECTED NOT DETECTED Final   Streptococcus pneumoniae NOT DETECTED NOT DETECTED Final   Streptococcus pyogenes NOT DETECTED NOT DETECTED Final   Acinetobacter baumannii NOT DETECTED NOT DETECTED Final   Enterobacteriaceae species DETECTED (A) NOT DETECTED Final    Comment: CRITICAL RESULT CALLED TO, READ BACK BY AND VERIFIED WITH: NATE COOKSON AT 0556 ON 01/16/16 [place].    Enterobacter cloacae complex NOT DETECTED NOT DETECTED Final   Escherichia coli  DETECTED (A) NOT DETECTED Final    Comment: CRITICAL RESULT CALLED TO, READ BACK BY AND VERIFIED WITH: NATE COOKSON AT [id number] ON 01/16/16 [place].    Klebsiella oxytoca NOT DETECTED NOT DETECTED Final   Klebsiella pneumoniae NOT DETECTED NOT DETECTED Final   Proteus species NOT DETECTED NOT DETECTED Final   Serratia marcescens NOT DETECTED NOT DETECTED Final   Carbapenem resistance NOT DETECTED NOT DETECTED Final   Haemophilus influenzae NOT DETECTED NOT DETECTED Final   Neisseria meningitidis NOT DETECTED NOT DETECTED Final   Pseudomonas aeruginosa NOT DETECTED NOT DETECTED Final   Candida albicans NOT DETECTED NOT DETECTED Final   Candida glabrata NOT DETECTED NOT DETECTED Final   Candida krusei NOT DETECTED NOT DETECTED Final   Candida parapsilosis NOT DETECTED NOT DETECTED Final   Candida tropicalis NOT DETECTED NOT DETECTED Final  Blood Culture ID Panel (Reflexed)     Status: Abnormal   Collection Time: 01/21/2016  1:40 PM  Result Value Ref Range Status   Enterococcus species NOT DETECTED NOT DETECTED Final   Vancomycin resistance NOT DETECTED NOT DETECTED Final   Listeria monocytogenes NOT DETECTED NOT DETECTED Final   Staphylococcus species DETECTED (A) NOT DETECTED Final    Comment: CRITICAL RESULT CALLED TO, READ BACK BY AND VERIFIED WITH: Charlane Ferretti @ [id number] 01/16/16 by [facility].    Staphylococcus aureus NOT DETECTED NOT DETECTED Final   Methicillin resistance NOT DETECTED NOT DETECTED Final   Streptococcus species NOT DETECTED NOT DETECTED Final   Streptococcus agalactiae NOT DETECTED NOT DETECTED Final   Streptococcus pneumoniae NOT DETECTED NOT DETECTED Final   Streptococcus pyogenes NOT DETECTED NOT DETECTED Final   Acinetobacter baumannii NOT DETECTED NOT DETECTED Final   Enterobacteriaceae species DETECTED (A) NOT DETECTED Final    Comment: CRITICAL RESULT CALLED TO, READ BACK BY AND VERIFIED WITH: Charlane Ferretti @ [id number] 01/16/16 by [facility].    Enterobacter cloacae complex NOT  DETECTED NOT DETECTED Final   Escherichia coli DETECTED (A) NOT DETECTED Final    Comment: CRITICAL RESULT CALLED TO, READ BACK BY AND VERIFIED WITH: Charlane Ferretti @ [id number] 01/16/16 by [facility].    Klebsiella oxytoca NOT DETECTED NOT DETECTED Final   Klebsiella pneumoniae NOT DETECTED NOT DETECTED Final   Proteus species NOT DETECTED NOT DETECTED Final   Serratia marcescens NOT DETECTED NOT DETECTED Final   Carbapenem resistance NOT DETECTED NOT DETECTED Final   Haemophilus influenzae NOT DETECTED NOT DETECTED Final   Neisseria meningitidis NOT DETECTED NOT DETECTED Final   Pseudomonas aeruginosa NOT DETECTED NOT DETECTED Final   Candida albicans NOT DETECTED NOT DETECTED Final   Candida glabrata  NOT DETECTED NOT DETECTED Final   Candida krusei NOT DETECTED NOT DETECTED Final   Candida parapsilosis NOT DETECTED NOT DETECTED Final   Candida tropicalis NOT DETECTED NOT DETECTED Final  MRSA PCR Screening     Status: None   Collection Time: 01/04/2016  7:00 PM  Result Value Ref Range Status   MRSA by PCR NEGATIVE NEGATIVE Final    Comment:        The GeneXpert MRSA Assay (FDA approved for NASAL specimens only), is one component of a comprehensive MRSA colonization surveillance program. It is not intended to diagnose MRSA infection nor to guide or monitor treatment for MRSA infections.    Creatinine:  Recent Labs  01/13/2016 1329 01/16/16 0455 01/16/16 1215 01/16/16 2348 01/17/16 0450  CREATININE 3.29* 2.82* 3.07* 3.05* 3.06*    Impression/Assessment:  Left ureteral stone, left hydronephrosis, sepsis -   Plan:  I discussed with anesthesiologist, critical NP and pt's husband the nature, potential benefits, risks and alternatives to cystoscopy, left retrograde pyelogram, left ureteroscopy, including side effects of the proposed treatment, the likelihood of the patient achieving the goals of the procedure, and any potential problems that might occur during the procedure or recuperation.  All questions answered. Patient husband elects to proceed. I discussed the patient has a life-threatening infection and stent placement could also be life-threatening. In discussing with anesthesiologist, it would be equally difficult for pt to go to IR suite. Pt is already on propofol gtt which will help with our procedure.     Joanne Salah 01/10/2016, 12:17 AM

## 2016-01-18 NOTE — Progress Notes (Signed)
POST DIALYSIS ASSESSMENT 

## 2016-01-18 NOTE — Progress Notes (Addendum)
Day of Surgery Subjective: Patient remains in critical condition. Discussed with nephrology Dr. Candiss Norse - he is planning dialysis due to acidosis, BUN, oliguria.   Objective: Vital signs in last 24 hours: Temp:  [95.7 F (35.4 C)-99.5 F (37.5 C)] 99.5 F (37.5 C) (11/26 0800) Pulse Rate:  [99-123] 117 (11/26 0900) Resp:  [12-31] 31 (11/26 0900) BP: (71-105)/(48-70) 98/64 (11/26 0900) SpO2:  [90 %-97 %] 95 % (11/26 0900) FiO2 (%):  [80 %-100 %] 80 % (11/26 0830) Weight:  [101.4 kg (223 lb 8.7 oz)] 101.4 kg (223 lb 8.7 oz) (11/26 0500)  Intake/Output from previous day: 11/25 0701 - 11/26 0700 In: 7335.9 [I.V.:6359.9; NG/GT:516; IV Piggyback:460] Out: 185 [Urine:185] Intake/Output this shift: Total I/O In: 433.2 [I.V.:433.2] Out: -   Physical Exam:  Sedated on vent.  Low UOP - urine dark   Lab Results:  Recent Labs  01/16/16 0455 01/17/16 0450 12/26/2015 0426  HGB 13.4 11.0* 10.9*  HCT 39.2 32.7* 32.3*   BMET  Recent Labs  01/17/16 0450 01/12/2016 0426  NA 132* 127*  K 3.5 4.2  CL 98* 91*  CO2 18* 23  GLUCOSE 245* 326*  BUN 102* 99*  CREATININE 3.06* 3.49*  CALCIUM 7.0* 6.3*   No results for input(s): LABPT, INR in the last 72 hours. No results for input(s): LABURIN in the last 72 hours. Results for orders placed or performed during the hospital encounter of 01/10/2016  Urine culture     Status: Abnormal   Collection Time: 01/11/2016  1:29 PM  Result Value Ref Range Status   Specimen Description URINE, RANDOM  Final   Special Requests NONE  Final   Culture >=100,000 COLONIES/mL ESCHERICHIA COLI (A)  Final   Report Status 01/17/2016 FINAL  Final   Organism ID, Bacteria ESCHERICHIA COLI (A)  Final      Susceptibility   Escherichia coli - MIC*    AMPICILLIN >=32 RESISTANT Resistant     CEFAZOLIN <=4 SENSITIVE Sensitive     CEFTRIAXONE <=1 SENSITIVE Sensitive     CIPROFLOXACIN <=0.25 SENSITIVE Sensitive     GENTAMICIN <=1 SENSITIVE Sensitive     IMIPENEM  <=0.25 SENSITIVE Sensitive     NITROFURANTOIN <=16 SENSITIVE Sensitive     TRIMETH/SULFA <=20 SENSITIVE Sensitive     AMPICILLIN/SULBACTAM >=32 RESISTANT Resistant     PIP/TAZO <=4 SENSITIVE Sensitive     Extended ESBL NEGATIVE Sensitive     * >=100,000 COLONIES/mL ESCHERICHIA COLI  Culture, blood (routine x 2)     Status: Abnormal (Preliminary result)   Collection Time: 01/19/2016  1:29 PM  Result Value Ref Range Status   Specimen Description BLOOD RIGHT HAND  Final   Special Requests   Final    BOTTLES DRAWN AEROBIC AND ANAEROBIC 11MLAERO,8MLANA   Culture  Setup Time   Final    GRAM NEGATIVE RODS IN BOTH AEROBIC AND ANAEROBIC BOTTLES CRITICAL RESULT CALLED TO, READ BACK BY AND VERIFIED WITH: NATE COOKSON AT [place] ON 01/16/16 [place].    Culture ESCHERICHIA COLI (A)  Final   Report Status PENDING  Incomplete   Organism ID, Bacteria ESCHERICHIA COLI  Final      Susceptibility   Escherichia coli - MIC*    AMPICILLIN >=32 RESISTANT Resistant     CEFAZOLIN <=4 SENSITIVE Sensitive     CEFEPIME <=1 SENSITIVE Sensitive     CEFTAZIDIME <=1 SENSITIVE Sensitive     CEFTRIAXONE <=1 SENSITIVE Sensitive     CIPROFLOXACIN <=0.25 SENSITIVE Sensitive  GENTAMICIN <=1 SENSITIVE Sensitive     IMIPENEM <=0.25 SENSITIVE Sensitive     TRIMETH/SULFA <=20 SENSITIVE Sensitive     AMPICILLIN/SULBACTAM >=32 RESISTANT Resistant     PIP/TAZO <=4 SENSITIVE Sensitive     Extended ESBL Value in next row Sensitive      [place] ([place] x 2)     Status: Abnormal   Collection Time: 12/29/2015  1:29 PM  Result Value Ref Range Status   Specimen Description BLOOD LEFT ANTECUBITAL  Final   Special Requests   Final    BOTTLES DRAWN AEROBIC AND ANAEROBIC AER 12ML ANA 11ML   Culture  Setup Time   Final    GRAM NEGATIVE RODS IN BOTH AEROBIC AND ANAEROBIC BOTTLES CRITICAL VALUE NOTED.  VALUE IS CONSISTENT WITH PREVIOUSLY REPORTED AND CALLED  VALUE. GRAM POSITIVE COCCI AEROBIC BOTTLE ONLY CRITICAL RESULT CALLED TO, READ BACK BY AND VERIFIED WITH: Charlane Ferretti @ [id number] 01/16/16 by [facility].    Culture (A)  Final    ESCHERICHIA COLI SUSCEPTIBILITIES PERFORMED ON PREVIOUS CULTURE WITHIN THE LAST 5 DAYS. STAPHYLOCOCCUS SPECIES (COAGULASE NEGATIVE) THE SIGNIFICANCE OF ISOLATING THIS ORGANISM FROM A SINGLE SET OF BLOOD CULTURES WHEN MULTIPLE SETS ARE DRAWN IS UNCERTAIN. PLEASE NOTIFY THE MICROBIOLOGY DEPARTMENT WITHIN ONE WEEK IF SPECIATION AND SENSITIVITIES ARE REQUIRED. Performed at [facility]    Report Status 01/04/2016 FINAL  Final  Blood Culture ID Panel (Reflexed)     Status: Abnormal   Collection Time: 01/10/2016  1:29 PM  Result Value Ref Range Status   Enterococcus species NOT DETECTED NOT DETECTED Final   Listeria monocytogenes NOT DETECTED NOT DETECTED Final   Staphylococcus species NOT DETECTED NOT DETECTED Final   Staphylococcus aureus NOT DETECTED NOT DETECTED Final   Streptococcus species NOT DETECTED NOT DETECTED Final   Streptococcus agalactiae NOT DETECTED NOT DETECTED Final   Streptococcus pneumoniae NOT DETECTED NOT DETECTED Final   Streptococcus pyogenes NOT DETECTED NOT DETECTED Final   Acinetobacter baumannii NOT DETECTED NOT DETECTED Final   Enterobacteriaceae species DETECTED (A) NOT DETECTED Final    Comment: CRITICAL RESULT CALLED TO, READ BACK BY AND VERIFIED WITH: NATE COOKSON AT 0556 ON 01/16/16 [place].    Enterobacter cloacae complex NOT DETECTED NOT DETECTED Final   Escherichia coli DETECTED (A) NOT DETECTED Final    Comment: CRITICAL RESULT CALLED TO, READ BACK BY AND VERIFIED WITH: NATE COOKSON AT [id number] ON 01/16/16 [place].    Klebsiella oxytoca NOT DETECTED NOT DETECTED Final   Klebsiella pneumoniae NOT DETECTED NOT DETECTED Final   Proteus species NOT DETECTED NOT DETECTED Final   Serratia marcescens NOT DETECTED NOT DETECTED Final   Carbapenem resistance NOT DETECTED NOT DETECTED Final    Haemophilus influenzae NOT DETECTED NOT DETECTED Final   Neisseria meningitidis NOT DETECTED NOT DETECTED Final   Pseudomonas aeruginosa NOT DETECTED NOT DETECTED Final   Candida albicans NOT DETECTED NOT DETECTED Final   Candida glabrata NOT DETECTED NOT DETECTED Final   Candida krusei NOT DETECTED NOT DETECTED Final   Candida parapsilosis NOT DETECTED NOT DETECTED Final   Candida tropicalis NOT DETECTED NOT DETECTED Final  Blood Culture ID Panel (Reflexed)     Status: Abnormal   Collection Time: 01/10/2016  1:40 PM  Result Value Ref Range Status   Enterococcus species NOT DETECTED NOT DETECTED Final   Vancomycin resistance NOT DETECTED NOT DETECTED Final   Listeria monocytogenes NOT DETECTED NOT DETECTED Final  Staphylococcus species DETECTED (A) NOT DETECTED Final    Comment: CRITICAL RESULT CALLED TO, READ BACK BY AND VERIFIED WITH: Charlane Ferretti @ [id number] 01/16/16 by [facility].    Staphylococcus aureus NOT DETECTED NOT DETECTED Final   Methicillin resistance NOT DETECTED NOT DETECTED Final   Streptococcus species NOT DETECTED NOT DETECTED Final   Streptococcus agalactiae NOT DETECTED NOT DETECTED Final   Streptococcus pneumoniae NOT DETECTED NOT DETECTED Final   Streptococcus pyogenes NOT DETECTED NOT DETECTED Final   Acinetobacter baumannii NOT DETECTED NOT DETECTED Final   Enterobacteriaceae species DETECTED (A) NOT DETECTED Final    Comment: CRITICAL RESULT CALLED TO, READ BACK BY AND VERIFIED WITH: Charlane Ferretti @ [id number] 01/16/16 by [facility].    Enterobacter cloacae complex NOT DETECTED NOT DETECTED Final   Escherichia coli DETECTED (A) NOT DETECTED Final    Comment: CRITICAL RESULT CALLED TO, READ BACK BY AND VERIFIED WITH: Charlane Ferretti @ [id number] 01/16/16 by [facility].    Klebsiella oxytoca NOT DETECTED NOT DETECTED Final   Klebsiella pneumoniae NOT DETECTED NOT DETECTED Final   Proteus species NOT DETECTED NOT DETECTED Final   Serratia marcescens NOT DETECTED NOT DETECTED Final   Carbapenem  resistance NOT DETECTED NOT DETECTED Final   Haemophilus influenzae NOT DETECTED NOT DETECTED Final   Neisseria meningitidis NOT DETECTED NOT DETECTED Final   Pseudomonas aeruginosa NOT DETECTED NOT DETECTED Final   Candida albicans NOT DETECTED NOT DETECTED Final   Candida glabrata NOT DETECTED NOT DETECTED Final   Candida krusei NOT DETECTED NOT DETECTED Final   Candida parapsilosis NOT DETECTED NOT DETECTED Final   Candida tropicalis NOT DETECTED NOT DETECTED Final  MRSA PCR Screening     Status: None   Collection Time: 01/19/2016  7:00 PM  Result Value Ref Range Status   MRSA by PCR NEGATIVE NEGATIVE Final    Comment:        The GeneXpert MRSA Assay (FDA approved for NASAL specimens only), is one component of a comprehensive MRSA colonization surveillance program. It is not intended to diagnose MRSA infection nor to guide or monitor treatment for MRSA infections.     Studies/Results: Ct Abdomen Pelvis Wo Contrast  Result Date: 01/17/2016 CLINICAL DATA:  Follow-up hydronephrosis EXAM: CT ABDOMEN AND PELVIS WITHOUT CONTRAST TECHNIQUE: Multidetector CT imaging of the abdomen and pelvis was performed following the standard protocol without IV contrast. COMPARISON:  Renal ultrasound 01/03/2016 FINDINGS: Lower chest: Bilateral lower lobe posterior consolidation with air bronchogram highly suspicious for pneumonia Hepatobiliary: Mild hepatomegaly. No intrahepatic biliary ductal dilatation. There is mild distended gallbladder. Partially calcified solitary gallstone within gallbladder measures 2.5 cm. Pancreas: Unenhanced pancreas shows no focal abnormality. Spleen: Unenhanced spleen is unremarkable. Adrenals/Urinary Tract: Right adrenal gland is unremarkable. There is a low-density nodule left adrenal gland measures 1.6 cm probable adenoma. Mild bilateral perinephric stranding. There is mild to moderate left hydronephrosis. Axial image 43 there is 6 mm calcified obstructive calculus in  proximal left ureter at the level of upper endplate of L4 vertebral body. Bilateral distal ureter is small caliber. There is a Foley catheter within decompressed urinary bladder. Stomach/Bowel: Oral contrast material was given to the patient. No small bowel obstruction. No gastric outlet obstruction. There is no pericecal inflammation. Some colonic gas noted in right colon. Descending colon and sigmoid colon are empty collapsed. Few diverticula are noted descending colon and sigmoid colon. No evidence of acute diverticulitis or acute colitis. A rectal catheter is noted. Vascular/Lymphatic: Atherosclerotic calcifications of abdominal aorta and iliac arteries. No  adenopathy. No aortic aneurysm. Reproductive: The patient is status post hysterectomy. Other: Mild anasarca infiltration of subcutaneous fat bilateral flank wall and lateral pelvic wall. Small ascites noted in left paracolic gutter. No evidence of free abdominal air. Musculoskeletal: No destructive bony lesions are noted. Degenerative changes thoracolumbar spine. There is about 4 mm anterolisthesis L5 on S1 vertebral body. Disc space flattening with vacuum disc phenomenon at L5-S1 level. IMPRESSION: 1. Bilateral mild perinephric stranding. There is mild to moderate left hydronephrosis. Axial image 43 there is 6 mm calcified obstructive calculus in proximal left ureter at the level of upper endplate of L4 vertebral body. 2. There is a Foley catheter within decompressed urinary bladder. 3. No small bowel obstruction. 4. Few diverticula are noted descending colon. No evidence of acute colitis or diverticulitis. 5. Status post hysterectomy. 6. Degenerative changes thoracolumbar spine. 7. There is about 4 mm anterolisthesis L5 on S1 vertebral body. 8. Bilateral lower lobe posterior consolidation with air bronchogram highly suspicious for pneumonia. Electronically Signed   By: Lahoma Crocker M.D.   On: 01/17/2016 14:12   Dg Chest 1 View  Result Date:  01/17/2016 CLINICAL DATA:  75 year old female with shortness of breath. Sepsis. Initial encounter. EXAM: CHEST 1 VIEW COMPARISON:  01/16/2016, levin 05/12/2015. FINDINGS: Portable AP upright view at 0446 hours. Stable right IJ central line. Progressive streaky bilateral infrahilar opacity since 12/28/2015. No pneumothorax. Stable pulmonary vascularity. No definite pleural effusion. Stable cardiac size and mediastinal contours. IMPRESSION: Progressive streaky lung base opacity since 01/17/2016, left greater than right. In the setting of sepsis this could reflect multifocal infection and/or atelectasis. No pleural effusion. Electronically Signed   By: Genevie Ann M.D.   On: 01/17/2016 06:55   Dg Abd 1 View  Result Date: 12/28/2015 CLINICAL DATA:  Abdominal distension EXAM: ABDOMEN - 1 VIEW COMPARISON:  01/17/2016 FINDINGS: Scattered large and small bowel gas is noted. No obstructive changes are seen. A left ureteral stent is now noted. The previously seen proximal ureteral stone is not well appreciated on this study. No free air is seen. IMPRESSION: Left ureteral stent. No acute abnormality is noted. Electronically Signed   By: Inez Catalina M.D.   On: 12/30/2015 07:26   Dg Abd 1 View  Result Date: 01/17/2016 CLINICAL DATA:  75 year old female with sepsis. OG tube placement. Initial encounter. EXAM: ABDOMEN - 1 VIEW COMPARISON:  Chest radiographs from today reported separately. FINDINGS: Portable AP supine view at 0702 hours. Enteric tube courses to the left upper quadrant, side hole projects at the level of the diaphragm. There is mild to moderate gaseous distension of the non dependent distal stomach. Mildly gas distended right colon as well. Streaky opacity at the left lung base. No acute osseous abnormality identified. IMPRESSION: 1. Enteric tube placed, tip at the level of the proximal stomach and side hole up the level of the diaphragm. Recommend advancing 8 cm to ensure side-hole placement within the  stomach. 2. Mild to moderate gaseous distension of the stomach, likely related to tube placement. Mild gaseous distension of the visible right colon. Electronically Signed   By: Genevie Ann M.D.   On: 01/17/2016 07:51   Portable Chest Xray  Result Date: 01/15/2016 CLINICAL DATA:  Sepsis EXAM: PORTABLE CHEST 1 VIEW COMPARISON:  01/17/2016 FINDINGS: Cardiac shadow is enlarged but stable. A right jugular central line, endotracheal tube and nasogastric catheter are seen in satisfactory position. Patchy infiltrative changes are seen in the bases as well as the left perihilar region. No pneumothorax is  seen. No bony abnormality is noted. IMPRESSION: Bilateral patchy opacities slightly increased in the right lung base when compared with the prior exam. Electronically Signed   By: Inez Catalina M.D.   On: 01/10/2016 07:22   Portable Chest Xray  Result Date: 01/17/2016 CLINICAL DATA:  75 year old female with sepsis. Intubated. Initial encounter. EXAM: PORTABLE CHEST 1 VIEW COMPARISON:  0446 hours today and earlier. FINDINGS: Portable AP semi upright view at 0701 hours. Endotracheal tube placed, tip in good position just below the level of the clavicles. Stable right IJ central line. Mildly increased patchy and confluent left greater than right perihilar and lung base opacity since 0446 hours today. Stable cardiac size and mediastinal contours. No pneumothorax. No pleural effusion identified. IMPRESSION: 1. Intubated, endotracheal tube tip in good position just below the level the clavicles. Stable right IJ central line. 2. Mildly increased perihilar and basilar opacity suspicious for bilateral infection. No pleural effusion identified. Electronically Signed   By: Genevie Ann M.D.   On: 01/17/2016 07:48    Assessment/Plan:  Sepsis - left proximal stone, sepsis - s/p left ureteral stent  11/26.   Will follow.    LOS: 3 days   Effie Wahlert 01/19/2016, 11:18 AM

## 2016-01-18 NOTE — Progress Notes (Signed)
Initial Nutrition Assessment  DOCUMENTATION CODES:   Obesity unspecified  INTERVENTION:  -Increase Vital High Protein to 62mL/hr -D/C Pro-Stat -Provides 1440 calories, 126gm protein, and 1204cc free water  NUTRITION DIAGNOSIS:   Inadequate oral intake related to inability to eat as evidenced by NPO status.  GOAL:   Provide needs based on ASPEN/SCCM guidelines  MONITOR:   Vent status, Labs, Weight trends, TF tolerance, I & O's  REASON FOR ASSESSMENT:   Consult, Ventilator Enteral/tube feeding initiation and management  ASSESSMENT:   75 y/o caucasian female presenting with urosepsis, septic shock and acute hypoxic respiratory failure necessitating intubation. CT abdomen 11/25 shows obstructing left renal calculi and hydronephrosis   Patient is currently intubated on ventilator support MV: 12 L/min Temp (24hrs), Avg:97.3 F (36.3 C), Min:95.7 F (35.4 C), Max:99.5 F (37.5 C) Propofol: 2.21ml/hr --> 71 calories Currently suffering from renal failure, to start dialysis today. Labs and medications reviewed: CBGs 233-322, Na 127, Phos 7.6, Tot Bili 3.5 B & C-Complex Vitamins, Vitamin D, K+ NS @ 68mL/hr Levo gtt, Amiodarone gtt  Diet Order:  Diet NPO time specified  Skin:  Reviewed, no issues  Last BM:  01/04/2016  Height:   Ht Readings from Last 1 Encounters:  01/22/2016 5\' 1"  (1.549 m)    Weight:   Wt Readings from Last 1 Encounters:  01/11/2016 223 lb 8.7 oz (101.4 kg)    Ideal Body Weight:  47.72 kg  BMI:  Body mass index is 42.24 kg/m.  Estimated Nutritional Needs:   Kcal:  IV:[id number] calories  Protein:  122 - 152 gm  Fluid:  Per MD/NP/PA  EDUCATION NEEDS:   No education needs identified at this time  Satira Anis. Wilfred Dayrit, MS, RD LDN Inpatient Clinical Dietitian Pager [phone]

## 2016-01-18 NOTE — Progress Notes (Signed)
Critical Calcium called to NP. Arvil Chaco.

## 2016-01-18 NOTE — Op Note (Signed)
Preoperative diagnosis: Left proximal ureteral stone, left hydronephrosis, sepsis Postoperative diagnosis: Same  Procedure: Cystoscopy, left retrograde pyelogram, left ureteral stent placement, Foley catheter placement  Surgeon: Keyonta Madrid  Anesthesia: Gen.  Indication for procedure: 75 year old critically ill white female with above diagnosis brought for urgent left renal decompression.  Findings: On cystoscopy the urethra was normal, the trigone and ureteral orifices were normal. There was purulent debris in the bladder. There were no stones or foreign bodies in the bladder. There was clear efflux from the right ureteral orifice. After passing the wire there was thick white purulent efflux from the left ureteral orifice.  Left retrograde pyelogram-there was a single ureter single collecting system unit. The ureter was normal caliber up to a filling defect in the left proximal ureter at the expected location of the stone with proximal dilation of the collecting system and renal pelvis.   Description of procedure: After consent was obtained patient brought to the operating room. After adequate anesthesia she was placed in lithotomy position and prepped and draped in the usual sterile fashion. A timeout was performed to confirm the patient and procedure. The cystoscope was passed per urethra and a 5 [place] open-ended catheter was used to cannulate the left ureteral orifice. Left retrograde injection of contrast was performed. A Glidewire was advanced. Thick purulent drainage was noted. The 5 [place] was passed proximal to the obstruction and the wire withdrawn. Purulent hydronephrotic drainage noted. A second retrograde was done just to confirm collecting system placement. The wire was advanced into the upper pole collecting system. The 5 [place] open-ended catheter was removed. A 6 x 24 7 m stent was advanced. The wire was removed with a good coil seen in the renal pelvis and a good coil in the bladder.  There was good purulent drainage through the stent. The scope was removed and a 16 [place] Foley catheter placed. The balloon was palpable and mobile at the bladder neck. Catheter irrigated normally. It was left gravity drainage. She was taken back to the ICU in critical condition.  Complications: None Blood loss: Minimal Specimens: None  Drains: 6 x 24 cm left ureteral stent

## 2016-01-18 NOTE — Progress Notes (Signed)
Pharmacy Antibiotic Note/ Electrolyte Monitoring  Heidi Villegas is a 75 y.o. female admitted on 12/24/2015 with sepsis.  Pharmacy has been consulted for meropenem and vancomycin dosing.  Pharmacy also consulted to assist in electrolyte monitoring  This is day #4 of antibiotics (day #3 of meropenem). MD wanted to broaden coverage on 11/25 and vancomycin was restarted. Patient underwent left ureteral stent placement overnight. Urine culture sensitivities resulted, blood cultures pending.  Plan: 1. Antibiotics Continue meropenem 500 mg IV q 12 hours for E coli bacteremia  Patient received a total of 2000 mg of vancomycin on 11/23 and was started on a 1000 mg IV q 48 hour regimen. Vancomycin was stopped on 11/24 but vancomycin and previous regimen was resumed on 11/25 and patient received vancomycin 1000 mg dose at 2300 last night.   At this time, will discontinue order for scheduled vancomycin given worsening renal function and order a vancomycin level for 11/27 at 2200 (48 hours from last dose). Can redose when level is <15 mcg/mL. Goal vancomycin level 15-20 mcg/mL.  Will need to closely monitor renal function for antibiotic dosing.  2. Electrolytes K and Mg WNL.  Corrected Ca = 7.9, calcium gluconate 1 g IV once was ordered by NP this morning and has been given.  Phosphorus = 7.6, will need to be cautious with Ca replacement.  No further supplementation warranted at this time. Will f/u AM labs.  Replace conservatively given worsening renal function.  Height: 5\' 1"  (154.9 cm) Weight: 223 lb 8.7 oz (101.4 kg) IBW/kg (Calculated) : 47.8  Temp (24hrs), Avg:97.4 F (36.3 C), Min:95.7 F (35.4 C), Max:100.2 F (37.9 C)   Recent Labs Lab 01/04/2016 1329  01/12/2016 2121 01/16/16 0455 01/16/16 1215 01/16/16 2348 01/17/16 0450 01/17/16 0751 01/17/16 1651 12/24/2015 0426  WBC 30.8*  --   --  25.7*  --   --  42.7*  --   --  49.3*  CREATININE 3.29*  --   --  2.82* 3.07* 3.05* 3.06*   --   --  3.49*  LATICACIDVEN 3.8*  < > 2.1* 2.2*  --  1.7  --  1.6 0.9  --   < > = values in this interval not displayed.  Estimated Creatinine Clearance: 15.7 mL/min (by C-G formula based on SCr of 3.49 mg/dL (H)).    Allergies  Allergen Reactions  . Codeine Nausea And Vomiting  . Sulfa Antibiotics     Antimicrobials this admission: Piperacillin/tazobactam 11/23 >> 11/24 Vancomycin 11/23 >> 11/24, 11/25 >> Meropenem 11/24 >>  Microbiology results: 11/23 BCx: E coli 2/2, staph 1/2, pending sensitivities 11/23 UCx: E coli resistant to ampicillin and amp/sulbactam 11/23 Sputum: pending  11/23 MRSA PCR: negative  Thank you for allowing pharmacy to be a part of this patient's care.  Lenis Noon, PharmD, BCPS Clinical Pharmacist  01/05/2016 7:16 AM

## 2016-01-18 NOTE — Progress Notes (Signed)
Pt continued to have decreased urinary output with elevated BUN/Creatinine.Did have urethral stent placed early this AM.  Dialysis cath placed and pt had two hours of dialysis this afternoon. Has been less responsive today, Maintaining BP with pressors. Restarted on TF via OG.

## 2016-01-18 NOTE — Discharge Instructions (Signed)
1. You may see some blood in the urine and may have some burning with urination for 48-72 hours. You also may notice that you have to urinate more frequently or urgently after your procedure which is normal.  2. You should call should you develop an inability urinate, fever > 101, persistent nausea and vomiting that prevents you from eating or drinking to stay hydrated.  3. You have a left ureteral stent, you will likely urinate more frequently and urgently until the stent is removed and you may experience some discomfort/pain in the lower abdomen and flank especially when urinating. You may take pain medication prescribed to you if needed for pain. You may also intermittently have blood in the urine until the stent/stone is removed. The stent is temporary. Be sure to keep follow-up appointments with you health care provider to plan for stent/stone removal.  4. If you have a catheter, you will be taught how to take care of the catheter by the nursing staff prior to discharge from the hospital.  You may periodically feel a strong urge to void with the catheter in place.  This is a bladder spasm and most often can occur when having a bowel movement or moving around. It is typically self-limited and usually will stop after a few minutes.  You may use some Vaseline or Neosporin around the tip of the catheter to reduce friction at the tip of the penis. You may also see some blood in the urine.  A very small amount of blood can make the urine look quite red.  As long as the catheter is draining well, there usually is not a problem.  However, if the catheter is not draining well and is bloody, you should call the office [phone]) to notify us.

## 2016-01-18 NOTE — Progress Notes (Signed)
PRE DIALYSIS ASSESSMENT 

## 2016-01-18 NOTE — Progress Notes (Signed)
Patient returned from OR.  ICU care resumed.

## 2016-01-18 NOTE — OR Nursing (Signed)
Intra-op medication:  Conray 43% mixed 1:1 with Sterile water  30 ml mixture used

## 2016-01-18 NOTE — Progress Notes (Signed)
Patient to OR for stent placement

## 2016-01-18 NOTE — Transfer of Care (Signed)
Immediate Anesthesia Transfer of Care Note  Patient: Heidi Villegas  Procedure(s) Performed: Procedure(s): CYSTOSCOPY WITH STENT PLACEMENT (Left)  Patient Location: ICU  Anesthesia Type:General  Level of Consciousness: sedated  Airway & Oxygen Therapy: Patient remains intubated per anesthesia plan and Patient placed on Ventilator (see vital sign flow sheet for setting)  Post-op Assessment: Report given to RN, Post -op Vital signs reviewed and stable and continues on vasoactive medications  Post vital signs: Reviewed  Last Vitals:  Vitals:   01/17/16 1900 01/17/16 2200  BP: (!) 93/58   Pulse: 99   Resp: 13   Temp: (!) 35.4 C 36.2 C    Last Pain:  Vitals:   01/17/16 1930  TempSrc:   PainSc: 0-No pain         Complications: No apparent anesthesia complications

## 2016-01-18 NOTE — Anesthesia Preprocedure Evaluation (Signed)
Anesthesia Evaluation  Patient identified by MRN, date of birth, ID band Patient unresponsive  General Assessment Comment:Limited history obtained from husband over telephone   Reviewed: Unable to perform ROS - Chart review onlyPreop documentation limited or incomplete due to emergent nature of procedure.  History of Anesthesia Complications Negative for: history of anesthetic complications  Airway       Comment: ETT in place  Dental   Pulmonary pneumonia,    breath sounds clear to auscultation- rhonchi (-) wheezing      Cardiovascular hypertension, Pt. on medications (-) CAD, (-) Past MI and (-) Cardiac Stents  Rhythm:Irregular Rate:Tachycardia - Systolic murmurs and - Diastolic murmurs    Neuro/Psych negative psych ROS   GI/Hepatic negative GI ROS, Neg liver ROS,   Endo/Other  diabetes, Type 2, Oral Hypoglycemic Agents  Renal/GU ARFRenal disease     Musculoskeletal   Abdominal (+) + obese,   Peds  Hematology negative hematology ROS (+)   Anesthesia Other Findings   Reproductive/Obstetrics                             Anesthesia Physical Anesthesia Plan  ASA: IV and emergent  Anesthesia Plan: General   Post-op Pain Management:    Induction: Intravenous  Airway Management Planned: Oral ETT  Additional Equipment:   Intra-op Plan:   Post-operative Plan: Post-operative intubation/ventilation  Informed Consent: I have reviewed the patients History and Physical, chart, labs and discussed the procedure including the risks, benefits and alternatives for the proposed anesthesia with the patient or authorized representative who has indicated his/her understanding and acceptance.   Consent reviewed with POA  Plan Discussed with: CRNA and Anesthesiologist  Anesthesia Plan Comments:         Anesthesia Quick Evaluation

## 2016-01-18 NOTE — Progress Notes (Signed)
Urine output remains low.  1100ml since 1900.  Informed NP. Arvil Chaco.  CT scan reviewed.  Per NP.

## 2016-01-18 NOTE — Anesthesia Postprocedure Evaluation (Signed)
Anesthesia Post Note  Patient: Heidi Villegas  Procedure(s) Performed: Procedure(s) (LRB): LEFT CYSTOSCOPY, LEFT RETROGRADE, LEFT URETERAL STENT (Left)  Patient location during evaluation: SICU Anesthesia Type: General Level of consciousness: sedated Pain management: pain level controlled Vital Signs Assessment: post-procedure vital signs reviewed and stable Respiratory status: patient remains intubated per anesthesia plan Cardiovascular status: stable Anesthetic complications: no    Last Vitals:  Vitals:   01/10/2016 0530 01/07/2016 0600  BP: (!) 79/62 97/70  Pulse: (!) 119 (!) 122  Resp: (!) 30 (!) 28  Temp:      Last Pain:  Vitals:   01/04/2016 0400  TempSrc: Oral  PainSc:                  Helaina Stefano

## 2016-01-18 NOTE — Progress Notes (Signed)
PULMONARY / CRITICAL CARE MEDICINE   Name: KRYSTLE DRECKMAN MRN: [id number] DOB: 12-Feb-1941    ADMISSION DATE:  01/20/2016  Discussion 75 y/o caucasian female presenting with urosepsis, septic shock and acute hypoxic respiratory failure necessitating intubation. CT abdomen 11/25 shows obstructing left renal calculi and hydronephrosis. Taken to the OR 11/26 for a cystoscopy with left retrogate pyelogram, ureteral stent and foley placement. Severe hypercarbia post op. Urine output improving  ASSESSMENT / PLAN:  PULMONARY A: Acute hypoxic respiratory failure possibly related to  septic shock-Failed BiPAP and emergently intubated 11/25 due to persistent hypoxemia, tachypnea and tachycardia with worsening mental status-severe hypercarbia post op  Mix Metabolic and respiratory  acidosis.  P:   Full vent support; settings adjusted with increase RR to 30 Repeat ABG with improved PCO2 from 70 to 45 CXR and ABG daily prn Nebulized bronchodilators. VAP Protocol D/C bicarb infusion  CARDIOVASCULAR A:  Septic shock Tachycardia Elevated troponin, likely stress related.  Afib with RVR.  P:  Continuous telemetry Continue amlodipine Amiodarone infusion and amiodarone boluses as needed for heart rate greater than 140. 2-D echo done, report pending Cardiology following IV fluids and pressors to keep mean arterial blood pressure greater than 65  RENAL A:   AKI possibly related to sepsis-worsening kidney function Left hydronephrosis with obstructing renal calculi on CT S/P STAT cystoscopy with stent placement 01/02/2016  Right renal mass-not apparent on CT abdomen P:   Follow BMET Avoid nephrotoxic drugs Replace electrolytes per ICU protocol Urology following Nephrology consult if kidney function continued to worsen   GASTROINTESTINAL A:   No active issues P:   Nothing by mouth status. Orogastric tube care  Tube feeds as tolerated Protonix for GI prophylaxis  HEMATOLOGIC A:    No active issues P:  Heparin for DVT prophylaxis Transfuse per usual guidelines  INFECTIOUS A:   Urosepsis with septic shock-worsening leukocytosis; blood cultures positive for Escherichia coli, Enterobacteriaceae species, and Staphylococcus species in both aerobic and anaerobic bottles. Final cultures pending. Urine culture positive for Escherichia coli with a colony count greater than 100,000 Leukocytosis with WBC=49 Hypothermic overnight requiring warming blanket P:   Temp improved with warming blanket.  Continue continue meropenem and start Unasyn if staph aureus.  Follow cultures Trend lactic acid Follow cbc   CULTURES: 11/23 BC; GNR; PCR- enterobacter, Ecoli in both aerobic and anaerobic bottles 11/23 UC; Escherichia coli 11/24 Flu negative MRSA PCR 11/23 negative.  Procalcitonin 11/25 165>> 11/26 158>>  ANTIBIOTICS: 11/23 Vancomycin>>11/24 11/23 Zosyn 11/25 Unasyn  ENDOCRINE A:   Diabetes Melitus P:   BS checks  SSI Coverage  NEUROLOGIC A:   Acute metabolic encephalopathy P:    RASS score -1 to 2  Fentanyl and propofol for vent sedation Monitor mental status with infection resolution   Disposition and family update: [name]'s husband, Mr. Daube updated via telephone before OR for cystoscopy by Anesthetist and Urologist. All questions answered and Support provided.  Case discussed with Dr. Ashby Dawes. Further changes in treatment plan pending patient's clinical course and diagnostics   STUDIES:   11/23 Renal ultrasound>>Mild to moderate left hydronephrosis without obstructing cause identified, right renal mass.  11/25 . 2-D echo pending  SIGNIFICANT EVENTS: 11/23 Patient was admitted with severe sepsis secondary to pylonephritis 11/24 Patient was in severe respiratory distress , requiring BiPAP  LINES/TUBES: 01/16/16 Right IJ>>   -----------------------------------------  CONSULTATION DATE:  01/16/16  REFERRING MD: Dr. Estanislado Pandy  CHIEF  COMPLAINT:  Respiratory distress  HISTORY OF PRESENT ILLNESS:   Silva Bandy  Panos is a 75 yo female with PMH significant for Arthritis, Hypertension and DM.  Patient [facility] 11/23 with severe sepsis, elevated lactic acid and altered mental status. On 11/24 patient was  Was noted to be severe respiratory distress along with tachycardia and hypertension.  PCCM team was consulted for further management.  Review of CXR; elevated right diaphragm, slight pulm edema/atelectasis.  ABG showed metabolic acidosis, mild lactic acidosis with one spurious value of 10>> 2.1  SUBJECTIVE:  Tachypnea, tachycardic, with declining mental status overnight. ABG showed metabolic acidosis with severe hypoxemia. Patient placed on BiPAP with no significant improvement. This morning, the decision was made to intubate patient due to worsening mental status and respiratory status. Patient intubated emergently. Now sedated  VITAL SIGNS: BP 97/70   Pulse (!) 122   Temp 97.9 F (36.6 C) (Oral)   Resp (!) 28   Ht 5\' 1"  (1.549 m)   Wt 223 lb 8.7 oz (101.4 kg)   SpO2 95%   BMI 42.24 kg/m   HEMODYNAMICS: CVP:  [10 mmHg-32 mmHg] 22 mmHg  VENTILATOR SETTINGS: Vent Mode: PRVC FiO2 (%):  [100 %] 100 % Set Rate:  [12 bmp-30 bmp] 30 bmp Vt Set:  [400 mL-500 mL] 400 mL PEEP:  [5 cmH20] 5 cmH20  INTAKE / OUTPUT: I/O last 3 completed shifts: In: 7387.6 [P.O.:1350; I.V.:5201.6; NG/GT:156; IV Piggyback:680] Out: 25 [Urine:25]  PHYSICAL EXAMINATION: General:  Elderly white female in severe respiratory distress Neuro: drowsy but arousable, does not follows command, moves all extremities HEENT:  Atraumatic, normocephalic, no discharge, no JVD appreciated Cardiovascular: Heart rate irregular at 150 bpm, S1, S2, no MRG noted Lungs: Breath sounds diminished in all lung fields, no wheezes or rhonchi Abdomen:  Soft, nontender, active bowel sounds Musculoskeletal:  No inflammation/deformity noted Skin:  Grossly  intact  LABS:  BMET  Recent Labs Lab 01/16/16 2348 01/17/16 0450 12/28/2015 0426  NA 134* 132* 127*  K 3.3* 3.5 4.2  CL 100* 98* 91*  CO2 20* 18* 23  BUN 99* 102* 99*  CREATININE 3.05* 3.06* 3.49*  GLUCOSE 208* 245* 326*    Electrolytes  Recent Labs Lab 01/16/16 2348 01/17/16 0450 01/19/2016 0426  CALCIUM 7.3* 7.0* 6.3*  MG 2.4 2.3 2.4  PHOS 3.4 3.5 7.6*    CBC  Recent Labs Lab 01/16/16 0455 01/17/16 0450 01/17/2016 0426  WBC 25.7* 42.7* 49.3*  HGB 13.4 11.0* 10.9*  HCT 39.2 32.7* 32.3*  PLT 101* 87* 145*    Coag's No results for input(s): APTT, INR in the last 168 hours.  Sepsis Markers  Recent Labs Lab 01/16/16 2348 01/17/16 0751 01/17/16 1651 01/19/2016 0426  LATICACIDVEN 1.7 1.6 0.9  --   PROCALCITON  --  165.85  --  158.93    ABG  Recent Labs Lab 01/17/16 0910 01/11/2016 0345 01/09/2016 0540  PHART 7.23* 7.13* 7.28*  PCO2ART 48 70* 45  PO2ART 90 55* 56*    Liver Enzymes  Recent Labs Lab 01/17/2016 1329 01/16/16 0455  AST 88* 138*  ALT 72* 114*  ALKPHOS 251* 262*  BILITOT 2.7* 3.5*  ALBUMIN 2.2* 2.2*    Cardiac Enzymes  Recent Labs Lab 12/25/2015 2121 01/16/16 0144 01/16/16 0455  TROPONINI 0.14* 0.26* 0.11*    Glucose  Recent Labs Lab 01/17/16 0729 01/17/16 1142 01/17/16 1616 01/17/16 1938 01/17/16 2332 01/15/2016 0328  GLUCAP 203* 187* 205* 233* 270* 291*    Imaging Ct Abdomen Pelvis Wo Contrast  Result Date: 01/17/2016 CLINICAL DATA:  Follow-up  hydronephrosis EXAM: CT ABDOMEN AND PELVIS WITHOUT CONTRAST TECHNIQUE: Multidetector CT imaging of the abdomen and pelvis was performed following the standard protocol without IV contrast. COMPARISON:  Renal ultrasound 01/12/2016 FINDINGS: Lower chest: Bilateral lower lobe posterior consolidation with air bronchogram highly suspicious for pneumonia Hepatobiliary: Mild hepatomegaly. No intrahepatic biliary ductal dilatation. There is mild distended gallbladder. Partially  calcified solitary gallstone within gallbladder measures 2.5 cm. Pancreas: Unenhanced pancreas shows no focal abnormality. Spleen: Unenhanced spleen is unremarkable. Adrenals/Urinary Tract: Right adrenal gland is unremarkable. There is a low-density nodule left adrenal gland measures 1.6 cm probable adenoma. Mild bilateral perinephric stranding. There is mild to moderate left hydronephrosis. Axial image 43 there is 6 mm calcified obstructive calculus in proximal left ureter at the level of upper endplate of L4 vertebral body. Bilateral distal ureter is small caliber. There is a Foley catheter within decompressed urinary bladder. Stomach/Bowel: Oral contrast material was given to the patient. No small bowel obstruction. No gastric outlet obstruction. There is no pericecal inflammation. Some colonic gas noted in right colon. Descending colon and sigmoid colon are empty collapsed. Few diverticula are noted descending colon and sigmoid colon. No evidence of acute diverticulitis or acute colitis. A rectal catheter is noted. Vascular/Lymphatic: Atherosclerotic calcifications of abdominal aorta and iliac arteries. No adenopathy. No aortic aneurysm. Reproductive: The patient is status post hysterectomy. Other: Mild anasarca infiltration of subcutaneous fat bilateral flank wall and lateral pelvic wall. Small ascites noted in left paracolic gutter. No evidence of free abdominal air. Musculoskeletal: No destructive bony lesions are noted. Degenerative changes thoracolumbar spine. There is about 4 mm anterolisthesis L5 on S1 vertebral body. Disc space flattening with vacuum disc phenomenon at L5-S1 level. IMPRESSION: 1. Bilateral mild perinephric stranding. There is mild to moderate left hydronephrosis. Axial image 43 there is 6 mm calcified obstructive calculus in proximal left ureter at the level of upper endplate of L4 vertebral body. 2. There is a Foley catheter within decompressed urinary bladder. 3. No small bowel  obstruction. 4. Few diverticula are noted descending colon. No evidence of acute colitis or diverticulitis. 5. Status post hysterectomy. 6. Degenerative changes thoracolumbar spine. 7. There is about 4 mm anterolisthesis L5 on S1 vertebral body. 8. Bilateral lower lobe posterior consolidation with air bronchogram highly suspicious for pneumonia. Electronically Signed   By: Lahoma Crocker M.D.   On: 01/17/2016 14:12   Dg Abd 1 View  Result Date: 01/17/2016 CLINICAL DATA:  75 year old female with sepsis. OG tube placement. Initial encounter. EXAM: ABDOMEN - 1 VIEW COMPARISON:  Chest radiographs from today reported separately. FINDINGS: Portable AP supine view at 0702 hours. Enteric tube courses to the left upper quadrant, side hole projects at the level of the diaphragm. There is mild to moderate gaseous distension of the non dependent distal stomach. Mildly gas distended right colon as well. Streaky opacity at the left lung base. No acute osseous abnormality identified. IMPRESSION: 1. Enteric tube placed, tip at the level of the proximal stomach and side hole up the level of the diaphragm. Recommend advancing 8 cm to ensure side-hole placement within the stomach. 2. Mild to moderate gaseous distension of the stomach, likely related to tube placement. Mild gaseous distension of the visible right colon. Electronically Signed   By: Genevie Ann M.D.   On: 01/17/2016 07:51   Portable Chest Xray  Result Date: 01/17/2016 CLINICAL DATA:  75 year old female with sepsis. Intubated. Initial encounter. EXAM: PORTABLE CHEST 1 VIEW COMPARISON:  0446 hours today and earlier. FINDINGS: Portable AP  semi upright view at 0701 hours. Endotracheal tube placed, tip in good position just below the level of the clavicles. Stable right IJ central line. Mildly increased patchy and confluent left greater than right perihilar and lung base opacity since 0446 hours today. Stable cardiac size and mediastinal contours. No pneumothorax. No  pleural effusion identified. IMPRESSION: 1. Intubated, endotracheal tube tip in good position just below the level the clavicles. Stable right IJ central line. 2. Mildly increased perihilar and basilar opacity suspicious for bilateral infection. No pleural effusion identified. Electronically Signed   By: Genevie Ann M.D.   On: 01/17/2016 07:48     [facility] ANP-BC Pulmonary and Critical Care Medicine [facility] Pager [phone] or [phone] 01/22/2016   Pt seen and examined with NP, agree with findings, assessment, plan as amended by me. Lung cta bilat; imaging c/w bibasilar atelectasis. Urosepsis with severe septic shock, s/p left ureteral stent. Continue abx, IVF.   Marda Stalker, M.D. 01/15/2016   Critical Care Attestation.  I have personally obtained a history, examined the patient, evaluated laboratory and imaging results, formulated the assessment and plan and placed orders. The Patient requires high complexity decision making for assessment and support, frequent evaluation and titration of therapies, application of advanced monitoring technologies and extensive interpretation of multiple databases. The patient has critical illness that could lead imminently to failure of 1 or more organ systems and requires the highest level of physician preparedness to intervene.  Critical Care Time devoted to patient care services described in this note is 45 minutes and is exclusive of time spent in procedures.

## 2016-01-18 NOTE — Consult Note (Signed)
Date: 01/06/2016                  Patient Name:  Heidi Villegas  MRN: [id number]  DOB: January 16, 1941  Age / Sex: 75 y.o., female         PCP: Gayland Curry, MD                 Service Requesting Consult: Internal medicine/Critical care                 Reason for Consult: ARF            History of Present Illness: Patient is a 75 y.o. female with medical problems of Diabetes, hypertension, hyperlipidemia, microalbuminuria, who was admitted to [facility] on 01/07/2016 for evaluation of weakness, foul urine odor and lethargy.  Patient underwent a CT scan of the abdomen on November 25 which showed bilateral mild perinephric stranding, left hydronephrosis, 6 mm calcified obstructive calculus and proximal left ureter, bilateral lower lobe consolidation with air bronchograms suspicious for pneumonia. Patient underwent cystoscopy, left retrograde pyelogram, left ureteral stent placement midnight Saturday/early morning Sunday. She is currently intubated and sedated requiring pressors Hospital course was further complicated by atrial fibrillation requiring intravenous administration of amiodarone Patient is not able to provide any history because of her critical condition and sedation   Medications: Outpatient medications: Prescriptions Prior to Admission  Medication Sig Dispense Refill Last Dose  . acetaminophen (TYLENOL) 500 MG tablet Take 500 mg by mouth every 6 (six) hours as needed.   prn at prn  . amLODipine (NORVASC) 2.5 MG tablet Take 2.5 mg by mouth daily. AM   01/14/2016 at Unknown time  . aspirin 81 MG tablet Take 81 mg by mouth daily. AM   01/14/2016 at Unknown time  . atorvastatin (LIPITOR) 80 MG tablet Take 1 tablet by mouth daily.   01/14/2016 at Unknown time  . b complex vitamins tablet Take 1 tablet by mouth daily. AM   01/14/2016 at Unknown time  . Cholecalciferol (VITAMIN D-3 PO) Take 1 tablet by mouth daily. AM    01/14/2016 at Unknown time  . CINNAMON PO Take by mouth. AM    01/14/2016 at Unknown time  . metFORMIN (GLUCOPHAGE-XR) 500 MG 24 hr tablet Take 1 tablet by mouth daily.   01/14/2016 at Unknown time  . Multiple Vitamins-Minerals (CENTRUM SILVER PO) Take by mouth. AM   01/14/2016 at Unknown time  . Multiple Vitamins-Minerals (PRESERVISION AREDS PO) Take by mouth. AM   01/14/2016 at Unknown time  . Omega-3 Fatty Acids (OMEGA 3 PO) Take by mouth. AM   01/14/2016 at Unknown time  . potassium chloride SA (K-DUR,KLOR-CON) 20 MEQ tablet Take 1 tablet by mouth daily.   01/14/2016 at Unknown time  . TURMERIC PO Take by mouth.   01/14/2016 at Unknown time    Current medications: Current Facility-Administered Medications  Medication Dose Route Frequency Provider Last Rate Last Dose  . 0.9 %  sodium chloride infusion   Intravenous Continuous Mikael Spray, NP 50 mL/hr at 01/03/2016 0209 50 mL/hr at 01/17/2016 0209  . acetaminophen (TYLENOL) tablet 500 mg  500 mg Oral Q6H PRN Nicholes Mango, MD   500 mg at 01/16/16 0850  . amiodarone (NEXTERONE PREMIX) 360-4.14 MG/200ML-% (1.8 mg/mL) IV infusion  30 mg/hr Intravenous Continuous Mikael Spray, NP 16.7 mL/hr at 01/06/2016 0551 30 mg/hr at 01/20/2016 0551  . amLODipine (NORVASC) tablet 2.5 mg  2.5 mg Oral Daily Bincy Jannetta Quint, NP      .  aspirin EC tablet 81 mg  81 mg Oral Daily Nicholes Mango, MD   Stopped at 01/17/16 1034  . atorvastatin (LIPITOR) tablet 80 mg  80 mg Oral QPM Nicholes Mango, MD   80 mg at 01/16/16 1729  . B-complex with vitamin C tablet 1 tablet  1 tablet Oral Daily Nicholes Mango, MD   Stopped at 01/17/16 1036  . bisacodyl (DULCOLAX) suppository 10 mg  10 mg Rectal Daily PRN Mikael Spray, NP      . chlorhexidine gluconate (MEDLINE KIT) (PERIDEX) 0.12 % solution 15 mL  15 mL Mouth Rinse BID Mikael Spray, NP   15 mL at 01/17/2016 0748  . cholecalciferol (VITAMIN D) tablet 1,000 Units  1,000 Units Oral Daily Nicholes Mango, MD   Stopped at 01/17/16 1036  . famotidine (PEPCID) IVPB 20 mg premix  20 mg  Intravenous Q48H Aruna Gouru, MD   20 mg at 01/17/16 1622  . feeding supplement (PRO-STAT SUGAR FREE 64) liquid 30 mL  30 mL Per Tube BID Henreitta Leber, MD   30 mL at 01/17/16 2200  . feeding supplement (VITAL HIGH PROTEIN) liquid 1,000 mL  1,000 mL Per Tube Q24H Henreitta Leber, MD   Stopped at 01/03/2016 0501  . fentaNYL (SUBLIMAZE) bolus via infusion 25 mcg  25 mcg Intravenous Q1H PRN Mikael Spray, NP   25 mcg at 01/17/16 0810  . heparin injection 5,000 Units  5,000 Units Subcutaneous Q8H Nicholes Mango, MD   5,000 Units at 01/12/2016 0555  . insulin aspart (novoLOG) injection 0-5 Units  0-5 Units Subcutaneous QHS Nicholes Mango, MD   2 Units at 01/17/16 2151  . insulin aspart (novoLOG) injection 0-9 Units  0-9 Units Subcutaneous TID WC Nicholes Mango, MD   7 Units at 01/21/2016 0752  . ipratropium-albuterol (DUONEB) 0.5-2.5 (3) MG/3ML nebulizer solution 3 mL  3 mL Nebulization Q6H Alexis Hugelmeyer, DO   3 mL at 12/31/2015 0719  . ipratropium-albuterol (DUONEB) 0.5-2.5 (3) MG/3ML nebulizer solution 3 mL  3 mL Nebulization Q4H PRN Bincy S Varughese, NP      . MEDLINE mouth rinse  15 mL Mouth Rinse QID Mikael Spray, NP   15 mL at 01/20/2016 0445  . meropenem (MERREM) IVPB SOLR 500 mg  500 mg Intravenous Q12H Saundra Shelling, MD   500 mg at 12/30/2015 0753  . metoprolol (LOPRESSOR) injection 5 mg  5 mg Intravenous Q2H PRN Saundra Shelling, MD   5 mg at 01/16/16 0826  . midazolam (VERSED) injection 1 mg  1 mg Intravenous Q15 min PRN Mikael Spray, NP      . midazolam (VERSED) injection 1 mg  1 mg Intravenous Q2H PRN Mikael Spray, NP      . norepinephrine (LEVOPHED) '4mg'$  in D5W 242m premix infusion  0-40 mcg/min Intravenous Titrated PLaverle Hobby MD 75 mL/hr at 12/29/2015 1002 20 mcg/min at 12/27/2015 1002  . ondansetron (ZOFRAN) tablet 4 mg  4 mg Oral Q6H PRN ANicholes Mango MD       Or  . ondansetron (ZOFRAN) injection 4 mg  4 mg Intravenous Q6H PRN ANicholes Mango MD      . pantoprazole (PROTONIX)  injection 40 mg  40 mg Intravenous Daily MMikael Spray NP   40 mg at 01/17/2016 0958  . potassium chloride SA (K-DUR,KLOR-CON) CR tablet 20 mEq  20 mEq Oral Daily ANicholes Mango MD   Stopped at 01/17/16 1038  . propofol (DIPRIVAN) 1000 MG/100ML infusion  5-80 mcg/kg/min Intravenous  Titrated Laverle Hobby, MD 2.7 mL/hr at 01/06/2016 0552 5 mcg/kg/min at 01/08/2016 0552  . sennosides (SENOKOT) 8.8 MG/5ML syrup 5 mL  5 mL Per Tube BID PRN Mikael Spray, NP      . sodium chloride flush (NS) 0.9 % injection 10-40 mL  10-40 mL Intracatheter Q12H Laverle Hobby, MD      . sodium chloride flush (NS) 0.9 % injection 10-40 mL  10-40 mL Intracatheter PRN Laverle Hobby, MD      . traMADol (ULTRAM) tablet 50 mg  50 mg Oral Q12H PRN Nicholes Mango, MD          Allergies: Allergies  Allergen Reactions  . Codeine Nausea And Vomiting  . Sulfa Antibiotics       Past Medical History: Past Medical History:  Diagnosis Date  . Arthritis    right hand, lower back  . Hypertension   . Macular degeneration   . Skin cancer      Past Surgical History: Past Surgical History:  Procedure Laterality Date  . ABDOMINAL HYSTERECTOMY    . BREAST BIOPSY    . BROW LIFT Bilateral 07/30/2014   Procedure: BLEPHAROPLASTY;  Surgeon: Karle Starch, MD;  Location: [place];  Service: Ophthalmology;  Laterality: Bilateral;  COSMETIC BILATERAL UPPER AND LOWER LIDS  . COLONOSCOPY    . WISDOM TOOTH EXTRACTION       Family History: No family history on file.   Social History: Social History   Social History  . Marital status: Married    Spouse name: N/A  . Number of children: N/A  . Years of education: N/A   Occupational History  . Not on file.   Social History Main Topics  . Smoking status: Never Smoker  . Smokeless tobacco: Never Used  . Alcohol use Yes     Comment: wine 1 glass/month  . Drug use: Unknown  . Sexual activity: Not on file   Other Topics Concern  . Not on  file   Social History Narrative  . No narrative on file     Review of Systems: not available Gen:  HEENT:  CV:  Resp:  GI: GU :  MS:  Derm:   Psych: Heme:  Neuro:  Endocrine  Vital Signs: Blood pressure 98/64, pulse (!) 117, temperature 99.5 F (37.5 C), temperature source Rectal, resp. rate (!) 31, height _0  (1.549 m), weight 101.4 kg (223 lb 8.7 oz), SpO2 95 %.   Intake/Output Summary (Last 24 hours) at 01/13/2016 1003 Last data filed at 12/29/2015 0900  Gross per 24 hour  Intake          5385.35 ml  Output              185 ml  Net          5200.35 ml    Weight trends: Filed Weights   01/06/2016 1335 01/03/2016 0500  Weight: 90.7 kg (200 lb) 101.4 kg (223 lb 8.7 oz)    Physical Exam: General:  Critically ill-appearing   HEENT Anicteric, pupils small and round, ET tube in place   Neck:  No masses   Lungs: Ventilator dependent, coarse bilaterally   Heart::  Tachycardic, irregular   Abdomen: Soft   Extremities:  + dependent edema on arms and legs   Neurologic: Sedated   Skin: No acute rashes   Access: To be placed   Foley: In place        Lab results: Basic Metabolic Panel:  Recent  Labs Lab 01/16/16 2348 01/17/16 0450 01/17/2016 0426  NA 134* 132* 127*  K 3.3* 3.5 4.2  CL 100* 98* 91*  CO2 20* 18* 23  GLUCOSE 208* 245* 326*  BUN 99* 102* 99*  CREATININE 3.05* 3.06* 3.49*  CALCIUM 7.3* 7.0* 6.3*  MG 2.4 2.3 2.4  PHOS 3.4 3.5 7.6*    Liver Function Tests:  Recent Labs Lab 01/16/16 0455  AST 138*  ALT 114*  ALKPHOS 262*  BILITOT 3.5*  PROT 6.1*  ALBUMIN 2.2*   No results for input(s): LIPASE, AMYLASE in the last 168 hours. No results for input(s): AMMONIA in the last 168 hours.  CBC:  Recent Labs Lab 01/05/2016 1329  01/17/16 0450 01/01/2016 0426  WBC 30.8*  < > 42.7* 49.3*  NEUTROABS 27.8*  --   --   --   HGB 14.1  < > 11.0* 10.9*  HCT 41.4  < > 32.7* 32.3*  MCV 84.3  < > 83.7 84.4  PLT 99*  < > 87* 145*  < > = values in this  interval not displayed.  Cardiac Enzymes:  Recent Labs Lab 01/16/16 0455  TROPONINI 0.11*    BNP: Invalid input(s): POCBNP  CBG:  Recent Labs Lab 01/17/16 1616 01/17/16 1938 01/17/16 2332 01/13/2016 0328 12/24/2015 0721  GLUCAP 205* 233* 270* 291* 322*    Microbiology: Recent Results (from the past 720 hour(s))  Urine culture     Status: Abnormal   Collection Time: 01/14/2016  1:29 PM  Result Value Ref Range Status   Specimen Description URINE, RANDOM  Final   Special Requests NONE  Final   Culture >=100,000 COLONIES/mL ESCHERICHIA COLI (A)  Final   Report Status 01/17/2016 FINAL  Final   Organism ID, Bacteria ESCHERICHIA COLI (A)  Final      Susceptibility   Escherichia coli - MIC*    AMPICILLIN >=32 RESISTANT Resistant     CEFAZOLIN <=4 SENSITIVE Sensitive     CEFTRIAXONE <=1 SENSITIVE Sensitive     CIPROFLOXACIN <=0.25 SENSITIVE Sensitive     GENTAMICIN <=1 SENSITIVE Sensitive     IMIPENEM <=0.25 SENSITIVE Sensitive     NITROFURANTOIN <=16 SENSITIVE Sensitive     TRIMETH/SULFA <=20 SENSITIVE Sensitive     AMPICILLIN/SULBACTAM >=32 RESISTANT Resistant     PIP/TAZO <=4 SENSITIVE Sensitive     Extended ESBL NEGATIVE Sensitive     * >=100,000 COLONIES/mL ESCHERICHIA COLI  Culture, blood (routine x 2)     Status: Abnormal (Preliminary result)   Collection Time: 01/08/2016  1:29 PM  Result Value Ref Range Status   Specimen Description BLOOD RIGHT HAND  Final   Special Requests   Final    BOTTLES DRAWN AEROBIC AND ANAEROBIC 11MLAERO,8MLANA   Culture  Setup Time   Final    GRAM NEGATIVE RODS IN BOTH AEROBIC AND ANAEROBIC BOTTLES CRITICAL RESULT CALLED TO, READ BACK BY AND VERIFIED WITH: NATE COOKSON AT 1941 ON 01/16/16 [place].    Culture ESCHERICHIA COLI (A)  Final   Report Status PENDING  Incomplete   Organism ID, Bacteria ESCHERICHIA COLI  Final      Susceptibility   Escherichia coli - MIC*    AMPICILLIN >=32 RESISTANT Resistant     CEFAZOLIN <=4 SENSITIVE  Sensitive     CEFEPIME <=1 SENSITIVE Sensitive     CEFTAZIDIME <=1 SENSITIVE Sensitive     CEFTRIAXONE <=1 SENSITIVE Sensitive     CIPROFLOXACIN <=0.25 SENSITIVE Sensitive     GENTAMICIN <=1 SENSITIVE Sensitive  IMIPENEM <=0.25 SENSITIVE Sensitive     TRIMETH/SULFA <=20 SENSITIVE Sensitive     AMPICILLIN/SULBACTAM >=32 RESISTANT Resistant     PIP/TAZO <=4 SENSITIVE Sensitive     Extended ESBL Value in next row Sensitive      NEGATIVEPerformed at [place], blood (routine x 2)     Status: Abnormal   Collection Time: 12/25/2015  1:29 PM  Result Value Ref Range Status   Specimen Description BLOOD LEFT ANTECUBITAL  Final   Special Requests   Final    BOTTLES DRAWN AEROBIC AND ANAEROBIC AER 12ML ANA 11ML   Culture  Setup Time   Final    GRAM NEGATIVE RODS IN BOTH AEROBIC AND ANAEROBIC BOTTLES CRITICAL VALUE NOTED.  VALUE IS CONSISTENT WITH PREVIOUSLY REPORTED AND CALLED VALUE. GRAM POSITIVE COCCI AEROBIC BOTTLE ONLY CRITICAL RESULT CALLED TO, READ BACK BY AND VERIFIED WITH: Charlane Ferretti @ 1740 01/16/16 by [facility].    Culture (A)  Final    ESCHERICHIA COLI SUSCEPTIBILITIES PERFORMED ON PREVIOUS CULTURE WITHIN THE LAST 5 DAYS. STAPHYLOCOCCUS SPECIES (COAGULASE NEGATIVE) THE SIGNIFICANCE OF ISOLATING THIS ORGANISM FROM A SINGLE SET OF BLOOD CULTURES WHEN MULTIPLE SETS ARE DRAWN IS UNCERTAIN. PLEASE NOTIFY THE MICROBIOLOGY DEPARTMENT WITHIN ONE WEEK IF SPECIATION AND SENSITIVITIES ARE REQUIRED. Performed at [facility]    Report Status 12/27/2015 FINAL  Final  Blood Culture ID Panel (Reflexed)     Status: Abnormal   Collection Time: 01/20/2016  1:29 PM  Result Value Ref Range Status   Enterococcus species NOT DETECTED NOT DETECTED Final   Listeria monocytogenes NOT DETECTED NOT DETECTED Final   Staphylococcus species NOT DETECTED NOT DETECTED Final   Staphylococcus aureus NOT DETECTED NOT DETECTED Final   Streptococcus species NOT DETECTED  NOT DETECTED Final   Streptococcus agalactiae NOT DETECTED NOT DETECTED Final   Streptococcus pneumoniae NOT DETECTED NOT DETECTED Final   Streptococcus pyogenes NOT DETECTED NOT DETECTED Final   Acinetobacter baumannii NOT DETECTED NOT DETECTED Final   Enterobacteriaceae species DETECTED (A) NOT DETECTED Final    Comment: CRITICAL RESULT CALLED TO, READ BACK BY AND VERIFIED WITH: NATE COOKSON AT 0556 ON 01/16/16 [place].    Enterobacter cloacae complex NOT DETECTED NOT DETECTED Final   Escherichia coli DETECTED (A) NOT DETECTED Final    Comment: CRITICAL RESULT CALLED TO, READ BACK BY AND VERIFIED WITH: NATE COOKSON AT 8144 ON 01/16/16 [place].    Klebsiella oxytoca NOT DETECTED NOT DETECTED Final   Klebsiella pneumoniae NOT DETECTED NOT DETECTED Final   Proteus species NOT DETECTED NOT DETECTED Final   Serratia marcescens NOT DETECTED NOT DETECTED Final   Carbapenem resistance NOT DETECTED NOT DETECTED Final   Haemophilus influenzae NOT DETECTED NOT DETECTED Final   Neisseria meningitidis NOT DETECTED NOT DETECTED Final   Pseudomonas aeruginosa NOT DETECTED NOT DETECTED Final   Candida albicans NOT DETECTED NOT DETECTED Final   Candida glabrata NOT DETECTED NOT DETECTED Final   Candida krusei NOT DETECTED NOT DETECTED Final   Candida parapsilosis NOT DETECTED NOT DETECTED Final   Candida tropicalis NOT DETECTED NOT DETECTED Final  Blood Culture ID Panel (Reflexed)     Status: Abnormal   Collection Time: 01/13/2016  1:40 PM  Result Value Ref Range Status   Enterococcus species NOT DETECTED NOT DETECTED Final   Vancomycin resistance NOT DETECTED NOT DETECTED Final   Listeria monocytogenes NOT DETECTED NOT DETECTED Final   Staphylococcus species DETECTED (A) NOT DETECTED Final  Comment: CRITICAL RESULT CALLED TO, READ BACK BY AND VERIFIED WITH: Charlane Ferretti @ 4580 01/16/16 by [facility]    Staphylococcus aureus NOT DETECTED NOT DETECTED Final   Methicillin resistance NOT DETECTED NOT DETECTED  Final   Streptococcus species NOT DETECTED NOT DETECTED Final   Streptococcus agalactiae NOT DETECTED NOT DETECTED Final   Streptococcus pneumoniae NOT DETECTED NOT DETECTED Final   Streptococcus pyogenes NOT DETECTED NOT DETECTED Final   Acinetobacter baumannii NOT DETECTED NOT DETECTED Final   Enterobacteriaceae species DETECTED (A) NOT DETECTED Final    Comment: CRITICAL RESULT CALLED TO, READ BACK BY AND VERIFIED WITH: Charlane Ferretti @ 9983 01/16/16 by [facility].    Enterobacter cloacae complex NOT DETECTED NOT DETECTED Final   Escherichia coli DETECTED (A) NOT DETECTED Final    Comment: CRITICAL RESULT CALLED TO, READ BACK BY AND VERIFIED WITH: Charlane Ferretti @ 3825 01/16/16 by [facility].    Klebsiella oxytoca NOT DETECTED NOT DETECTED Final   Klebsiella pneumoniae NOT DETECTED NOT DETECTED Final   Proteus species NOT DETECTED NOT DETECTED Final   Serratia marcescens NOT DETECTED NOT DETECTED Final   Carbapenem resistance NOT DETECTED NOT DETECTED Final   Haemophilus influenzae NOT DETECTED NOT DETECTED Final   Neisseria meningitidis NOT DETECTED NOT DETECTED Final   Pseudomonas aeruginosa NOT DETECTED NOT DETECTED Final   Candida albicans NOT DETECTED NOT DETECTED Final   Candida glabrata NOT DETECTED NOT DETECTED Final   Candida krusei NOT DETECTED NOT DETECTED Final   Candida parapsilosis NOT DETECTED NOT DETECTED Final   Candida tropicalis NOT DETECTED NOT DETECTED Final  MRSA PCR Screening     Status: None   Collection Time: 01/21/2016  7:00 PM  Result Value Ref Range Status   MRSA by PCR NEGATIVE NEGATIVE Final    Comment:        The GeneXpert MRSA Assay (FDA approved for NASAL specimens only), is one component of a comprehensive MRSA colonization surveillance program. It is not intended to diagnose MRSA infection nor to guide or monitor treatment for MRSA infections.      Coagulation Studies: No results for input(s): LABPROT, INR in the last 72 hours.  Urinalysis:  Recent  Labs  01/14/2016 1329  COLORURINE AMBER*  LABSPEC 1.011  PHURINE 5.0  GLUCOSEU NEGATIVE  HGBUR 2+*  BILIRUBINUR NEGATIVE  KETONESUR NEGATIVE  PROTEINUR 30*  NITRITE NEGATIVE  LEUKOCYTESUR 3+*        Imaging: Ct Abdomen Pelvis Wo Contrast  Result Date: 01/17/2016 CLINICAL DATA:  Follow-up hydronephrosis EXAM: CT ABDOMEN AND PELVIS WITHOUT CONTRAST TECHNIQUE: Multidetector CT imaging of the abdomen and pelvis was performed following the standard protocol without IV contrast. COMPARISON:  Renal ultrasound 01/22/2016 FINDINGS: Lower chest: Bilateral lower lobe posterior consolidation with air bronchogram highly suspicious for pneumonia Hepatobiliary: Mild hepatomegaly. No intrahepatic biliary ductal dilatation. There is mild distended gallbladder. Partially calcified solitary gallstone within gallbladder measures 2.5 cm. Pancreas: Unenhanced pancreas shows no focal abnormality. Spleen: Unenhanced spleen is unremarkable. Adrenals/Urinary Tract: Right adrenal gland is unremarkable. There is a low-density nodule left adrenal gland measures 1.6 cm probable adenoma. Mild bilateral perinephric stranding. There is mild to moderate left hydronephrosis. Axial image 43 there is 6 mm calcified obstructive calculus in proximal left ureter at the level of upper endplate of L4 vertebral body. Bilateral distal ureter is small caliber. There is a Foley catheter within decompressed urinary bladder. Stomach/Bowel: Oral contrast material was given to the patient. No small bowel obstruction. No gastric outlet obstruction. There is no  pericecal inflammation. Some colonic gas noted in right colon. Descending colon and sigmoid colon are empty collapsed. Few diverticula are noted descending colon and sigmoid colon. No evidence of acute diverticulitis or acute colitis. A rectal catheter is noted. Vascular/Lymphatic: Atherosclerotic calcifications of abdominal aorta and iliac arteries. No adenopathy. No aortic aneurysm.  Reproductive: The patient is status post hysterectomy. Other: Mild anasarca infiltration of subcutaneous fat bilateral flank wall and lateral pelvic wall. Small ascites noted in left paracolic gutter. No evidence of free abdominal air. Musculoskeletal: No destructive bony lesions are noted. Degenerative changes thoracolumbar spine. There is about 4 mm anterolisthesis L5 on S1 vertebral body. Disc space flattening with vacuum disc phenomenon at L5-S1 level. IMPRESSION: 1. Bilateral mild perinephric stranding. There is mild to moderate left hydronephrosis. Axial image 43 there is 6 mm calcified obstructive calculus in proximal left ureter at the level of upper endplate of L4 vertebral body. 2. There is a Foley catheter within decompressed urinary bladder. 3. No small bowel obstruction. 4. Few diverticula are noted descending colon. No evidence of acute colitis or diverticulitis. 5. Status post hysterectomy. 6. Degenerative changes thoracolumbar spine. 7. There is about 4 mm anterolisthesis L5 on S1 vertebral body. 8. Bilateral lower lobe posterior consolidation with air bronchogram highly suspicious for pneumonia. Electronically Signed   By: Lahoma Crocker M.D.   On: 01/17/2016 14:12   Dg Chest 1 View  Result Date: 01/17/2016 CLINICAL DATA:  75 year old female with shortness of breath. Sepsis. Initial encounter. EXAM: CHEST 1 VIEW COMPARISON:  01/16/2016, levin 05/12/2015. FINDINGS: Portable AP upright view at 0446 hours. Stable right IJ central line. Progressive streaky bilateral infrahilar opacity since 01/06/2016. No pneumothorax. Stable pulmonary vascularity. No definite pleural effusion. Stable cardiac size and mediastinal contours. IMPRESSION: Progressive streaky lung base opacity since 01/19/2016, left greater than right. In the setting of sepsis this could reflect multifocal infection and/or atelectasis. No pleural effusion. Electronically Signed   By: Genevie Ann M.D.   On: 01/17/2016 06:55   Dg Abd 1  View  Result Date: 01/22/2016 CLINICAL DATA:  Abdominal distension EXAM: ABDOMEN - 1 VIEW COMPARISON:  01/17/2016 FINDINGS: Scattered large and small bowel gas is noted. No obstructive changes are seen. A left ureteral stent is now noted. The previously seen proximal ureteral stone is not well appreciated on this study. No free air is seen. IMPRESSION: Left ureteral stent. No acute abnormality is noted. Electronically Signed   By: Inez Catalina M.D.   On: 01/20/2016 07:26   Dg Abd 1 View  Result Date: 01/17/2016 CLINICAL DATA:  75 year old female with sepsis. OG tube placement. Initial encounter. EXAM: ABDOMEN - 1 VIEW COMPARISON:  Chest radiographs from today reported separately. FINDINGS: Portable AP supine view at 0702 hours. Enteric tube courses to the left upper quadrant, side hole projects at the level of the diaphragm. There is mild to moderate gaseous distension of the non dependent distal stomach. Mildly gas distended right colon as well. Streaky opacity at the left lung base. No acute osseous abnormality identified. IMPRESSION: 1. Enteric tube placed, tip at the level of the proximal stomach and side hole up the level of the diaphragm. Recommend advancing 8 cm to ensure side-hole placement within the stomach. 2. Mild to moderate gaseous distension of the stomach, likely related to tube placement. Mild gaseous distension of the visible right colon. Electronically Signed   By: Genevie Ann M.D.   On: 01/17/2016 07:51   Portable Chest Xray  Result Date: 01/09/2016 CLINICAL DATA:  Sepsis EXAM: PORTABLE CHEST 1 VIEW COMPARISON:  01/17/2016 FINDINGS: Cardiac shadow is enlarged but stable. A right jugular central line, endotracheal tube and nasogastric catheter are seen in satisfactory position. Patchy infiltrative changes are seen in the bases as well as the left perihilar region. No pneumothorax is seen. No bony abnormality is noted. IMPRESSION: Bilateral patchy opacities slightly increased in the right  lung base when compared with the prior exam. Electronically Signed   By: Inez Catalina M.D.   On: 01/11/2016 07:22   Portable Chest Xray  Result Date: 01/17/2016 CLINICAL DATA:  75 year old female with sepsis. Intubated. Initial encounter. EXAM: PORTABLE CHEST 1 VIEW COMPARISON:  0446 hours today and earlier. FINDINGS: Portable AP semi upright view at 0701 hours. Endotracheal tube placed, tip in good position just below the level of the clavicles. Stable right IJ central line. Mildly increased patchy and confluent left greater than right perihilar and lung base opacity since 0446 hours today. Stable cardiac size and mediastinal contours. No pneumothorax. No pleural effusion identified. IMPRESSION: 1. Intubated, endotracheal tube tip in good position just below the level the clavicles. Stable right IJ central line. 2. Mildly increased perihilar and basilar opacity suspicious for bilateral infection. No pleural effusion identified. Electronically Signed   By: Genevie Ann M.D.   On: 01/17/2016 07:48      Assessment & Plan: Pt is a 75 y.o. Caucasian female with medical problems of diabetes, hypertension, atrial fibrillation, was admitted on 12/31/2015 with sepsis from urinary source and bilateral pneumonia.   1. Acute renal failure, likely ATN from concurrent illness 2. Bilateral pneumonia 3. Acute respiratory failure requiring ventilator support 4. Sepsis. Escherichia coli in blood and urine. Hypotension requiring pressors 5. Hypocalcemia,  6. 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 Creatinine baseline 1.1 from October 2017. GFR 49 7. Acidosis, mixed respiratory and metabolic 8. Left hydronephrosis from 6 mm stone. Urgent stent placement early morning 11/26  Patient is acutely ill with sepsis from left hydronephrosis caused by 6 mm stone as well as bilateral pneumonia as seen on CT. Blood cultures and urine cultures are positive for Escherichia coli. Enterobacter species is  also detected in blood. Patient is currently oliguric with minimal urine output. BUN/creatinine is 99. Creatinine 3.49. Potassium 4.2 and sodium 127. PH is 7.28.  Plan: Due to worsening clinical condition, acidosis, BUN, oliguria - dialysis is recommended. This was discussed with patient's husband over the phone who has verbally consented to proceed with dialysis. All questions were answered. Her husband stated he is familiar with dialysis. Discussed case with ICU team. They will place a temporary dialysis access. We will plan on dialyzing this patient today.  Re-assess for need of dialysis on a daily basis.

## 2016-01-19 ENCOUNTER — Encounter: Payer: Self-pay | Admitting: Urology

## 2016-01-19 DIAGNOSIS — N170 Acute kidney failure with tubular necrosis: Secondary | ICD-10-CM

## 2016-01-19 DIAGNOSIS — R6521 Severe sepsis with septic shock: Secondary | ICD-10-CM

## 2016-01-19 DIAGNOSIS — J9601 Acute respiratory failure with hypoxia: Secondary | ICD-10-CM

## 2016-01-19 DIAGNOSIS — A419 Sepsis, unspecified organism: Principal | ICD-10-CM

## 2016-01-19 LAB — CULTURE, BLOOD (ROUTINE X 2)

## 2016-01-19 LAB — BASIC METABOLIC PANEL
Anion gap: 11 (ref 5–15)
BUN: 70 mg/dL — AB (ref 6–20)
CALCIUM: 6.2 mg/dL — AB (ref 8.9–10.3)
CO2: 24 mmol/L (ref 22–32)
CREATININE: 2.89 mg/dL — AB (ref 0.44–1.00)
Chloride: 94 mmol/L — ABNORMAL LOW (ref 101–111)
GFR calc Af Amer: 18 mL/min — ABNORMAL LOW (ref >60)
GFR, EST NON AFRICAN AMERICAN: 15 mL/min — AB (ref >60)
GLUCOSE: 288 mg/dL — AB (ref 65–99)
Potassium: 3.4 mmol/L — ABNORMAL LOW (ref 3.5–5.1)
Sodium: 129 mmol/L — ABNORMAL LOW (ref 135–145)

## 2016-01-19 LAB — GLUCOSE, CAPILLARY
GLUCOSE-CAPILLARY: 338 mg/dL — AB (ref 65–99)
GLUCOSE-CAPILLARY: 317 mg/dL — AB (ref 65–99)
GLUCOSE-CAPILLARY: 246 mg/dL — AB (ref 65–99)
GLUCOSE-CAPILLARY: 242 mg/dL — AB (ref 65–99)
GLUCOSE-CAPILLARY: 261 mg/dL — AB (ref 65–99)
GLUCOSE-CAPILLARY: 276 mg/dL — AB (ref 65–99)
GLUCOSE-CAPILLARY: 250 mg/dL — AB (ref 65–99)
Glucose-Capillary: 297 mg/dL — ABNORMAL HIGH (ref 65–99)
Glucose-Capillary: 274 mg/dL — ABNORMAL HIGH (ref 65–99)
Glucose-Capillary: 281 mg/dL — ABNORMAL HIGH (ref 65–99)
Glucose-Capillary: 235 mg/dL — ABNORMAL HIGH (ref 65–99)
Glucose-Capillary: 226 mg/dL — ABNORMAL HIGH (ref 65–99)

## 2016-01-19 LAB — PROCALCITONIN: PROCALCITONIN: 115.41 ng/mL

## 2016-01-19 LAB — ECHOCARDIOGRAM COMPLETE
Height: 61 in
Weight: 3200 oz

## 2016-01-19 LAB — PHOSPHORUS
PHOSPHORUS: 4.6 mg/dL (ref 2.5–4.6)
Phosphorus: 6.6 mg/dL — ABNORMAL HIGH (ref 2.5–4.6)

## 2016-01-19 LAB — MAGNESIUM: Magnesium: 2.1 mg/dL (ref 1.7–2.4)

## 2016-01-19 LAB — ALBUMIN: ALBUMIN: 1.3 g/dL — AB (ref 3.5–5.0)

## 2016-01-19 MED ORDER — CEFTRIAXONE SODIUM-DEXTROSE 2-2.22 GM-% IV SOLR
2.0000 g | INTRAVENOUS | Status: DC
  Administered 2016-01-19 – 2016-01-27 (×9): 2 g via INTRAVENOUS
  Filled 2016-01-19 (×11): qty 50

## 2016-01-19 MED ORDER — DEXTROSE 5 % IV SOLN: 2.0000 g | INTRAVENOUS | Status: DC

## 2016-01-19 MED ORDER — CALCIUM CARBONATE ANTACID 1250 MG/5ML PO SUSP
500.0000 mg | Freq: Once | ORAL | Status: AC
  Administered 2016-01-19: 500 mg
  Filled 2016-01-19 (×2): qty 5

## 2016-01-19 MED ORDER — SODIUM CHLORIDE 0.9 % IV SOLN
INTRAVENOUS | Status: DC
  Administered 2016-01-19: 2.6 [IU]/h via INTRAVENOUS
  Administered 2016-01-20: 5.8 [IU]/h via INTRAVENOUS
  Filled 2016-01-19 (×2): qty 2.5

## 2016-01-19 MED ORDER — VECURONIUM BROMIDE 10 MG IV SOLR
10.0000 mg | INTRAVENOUS | Status: DC | PRN
  Administered 2016-01-19: 10 mg via INTRAVENOUS
  Filled 2016-01-19: qty 10

## 2016-01-19 MED ORDER — VECURONIUM BROMIDE 10 MG IV SOLR
INTRAVENOUS | Status: AC
  Administered 2016-01-19: 10 mg
  Filled 2016-01-19: qty 10

## 2016-01-19 MED ORDER — FENTANYL 2500MCG IN NS 250ML (10MCG/ML) PREMIX INFUSION
10.0000 ug/h | INTRAVENOUS | Status: DC
  Administered 2016-01-19: 25 ug/h via INTRAVENOUS
  Administered 2016-01-20: 100 ug/h via INTRAVENOUS
  Filled 2016-01-19 (×2): qty 250

## 2016-01-19 NOTE — Progress Notes (Signed)
Dialysis complete

## 2016-01-19 NOTE — Progress Notes (Signed)
PULMONARY / CRITICAL CARE MEDICINE   Name: Heidi Villegas MRN: [id number] DOB: 1940-12-28    ADMISSION DATE:  01/20/2016  Discussion 75 y/o caucasian female presenting with urosepsis, septic shock and acute hypoxic respiratory failure necessitating intubation. CT abdomen 11/25 shows obstructing left renal calculi and hydronephrosis. Taken to the OR 11/26 for a cystoscopy with left retrogate pyelogram, ureteral stent and foley placement. Severe hypercarbia post op. Urine output improving  ASSESSMENT / PLAN:  PULMONARY A: Acute hypoxic respiratory failure possibly related to  septic shock-Failed BiPAP and emergently intubated 11/25 due to persistent hypoxemia, tachypnea and tachycardia with worsening mental status-severe hypercarbia post op  Mix Metabolic and respiratory  acidosis.  P:   Full vent support; settings adjusted with increase RR to 30 Repeat ABG with improved PCO2 from 70 to 45 CXR and ABG daily prn Nebulized bronchodilators. VAP Protocol D/C bicarb infusion  CARDIOVASCULAR A:  Septic shock Tachycardia Elevated troponin, likely stress related.  Afib with RVR.  P:  Continuous telemetry Continue amlodipine Amiodarone infusion and amiodarone boluses as needed for heart rate greater than 140. 2-D echo done, report pending Cardiology following IV fluids and pressors to keep mean arterial blood pressure greater than 65  RENAL A:   AKI possibly related to sepsis-worsening kidney function Left hydronephrosis with obstructing renal calculi on CT S/P STAT cystoscopy with stent placement 01/15/2016  Right renal mass-not apparent on CT abdomen P:   Follow BMET Avoid nephrotoxic drugs Replace electrolytes per ICU protocol Urology following Nephrology consult if kidney function continued to worsen   GASTROINTESTINAL A:   No active issues P:   Nothing by mouth status. Orogastric tube care  Tube feeds as tolerated Protonix for GI prophylaxis  HEMATOLOGIC A:    No active issues P:  Heparin for DVT prophylaxis Transfuse per usual guidelines  INFECTIOUS A:   Urosepsis with septic shock-worsening leukocytosis; blood cultures positive for Escherichia coli, Enterobacteriaceae species, and Staphylococcus species in both aerobic and anaerobic bottles. Final cultures pending. Urine culture positive for Escherichia coli with a colony count greater than 100,000 Leukocytosis with WBC=49 Hypothermic overnight requiring warming blanket P:   Temp improved with warming blanket.  Continue continue meropenem and start Unasyn if staph aureus.  Follow cultures Trend lactic acid Follow cbc   CULTURES: 11/23 BC; GNR; PCR- enterobacter, Ecoli in both aerobic and anaerobic bottles 11/23 UC; Escherichia coli 11/24 Flu negative MRSA PCR 11/23 negative.  Procalcitonin 11/25 165>> 11/26 158>>  ANTIBIOTICS: 11/23 Vancomycin>>11/24 11/23 Zosyn 11/25 Unasyn  ENDOCRINE A:   Diabetes Melitus P:   BS checks  SSI Coverage  NEUROLOGIC A:   Acute metabolic encephalopathy P:    RASS score -1 to 2  Fentanyl and propofol for vent sedation Monitor mental status with infection resolution      STUDIES:   11/23 Renal ultrasound>>Mild to moderate left hydronephrosis without obstructing cause identified, right renal mass.  11/25 . 2-D echo pending  SIGNIFICANT EVENTS: 11/23 Patient was admitted with severe sepsis secondary to pylonephritis 11/24 Patient was in severe respiratory distress , requiring BiPAP  LINES/TUBES: 01/16/16 Right IJ>>   -----------------------------------------  CONSULTATION DATE:  01/16/16  REFERRING MD: Dr. Estanislado Pandy  CHIEF COMPLAINT:  Respiratory distress  HISTORY OF PRESENT ILLNESS:   Heidi Villegas is a 75 yo female with PMH significant for Arthritis, Hypertension and DM.  Patient presented to [facility] ON 11/23 with severe sepsis, elevated lactic acid and altered mental status. On 11/24 patient was  Was noted to be severe  respiratory distress along with tachycardia and hypertension.  PCCM team was consulted for further management.  Review of CXR; elevated right diaphragm, slight pulm edema/atelectasis.  ABG showed metabolic acidosis, mild lactic acidosis with one spurious value of 10>> 2.1  SUBJECTIVE:  ABG showed metabolic acidosis with severe hypoxemia.  Patient placed on BiPAP with no significant improvement.   intubated for  worsening mental status and respiratory status. Patient intubated emergently. Now sedated On full vent support  VITAL SIGNS: BP 103/66   Pulse (!) 116   Temp 98.7 F (37.1 C)   Resp (!) 21   Ht 5\' 1"  (1.549 m)   Wt 223 lb 8.7 oz (101.4 kg)   SpO2 96%   BMI 42.24 kg/m   HEMODYNAMICS: CVP:  [17 mmHg-23 mmHg] 19 mmHg  VENTILATOR SETTINGS: Vent Mode: PRVC FiO2 (%):  [70 %-80 %] 70 % Set Rate:  [30 bmp] 30 bmp Vt Set:  [400 mL] 400 mL PEEP:  [10 cmH20] 10 cmH20  INTAKE / OUTPUT: I/O last 3 completed shifts: In: 7513 [I.V.:5791.3; NG/GT:1211.7; IV Piggyback:510] Out: 210 [Urine:210]  PHYSICAL EXAMINATION: General:  Gcs<8T, intubated,sedated HEENT:  Atraumatic, normocephalic, no discharge, no JVD appreciated Cardiovascular: Heart rate irregular at 150 bpm, S1, S2, no MRG noted Lungs: Breath sounds diminished in all lung fields, no wheezes or rhonchi Abdomen:  Soft, nontender, active bowel sounds Musculoskeletal:  No inflammation/deformity noted Skin:  Grossly intact  LABS:  BMET  Recent Labs Lab 12/27/2015 0426 01/07/2016 1215 01/19/16 0419  NA 127* 127* 129*  K 4.2 3.8 3.4*  CL 91* 90* 94*  CO2 23 21* 24  BUN 99* 98* 70*  CREATININE 3.49* 3.65* 2.89*  GLUCOSE 326* 322* 288*    Electrolytes  Recent Labs Lab 01/17/16 0450 01/15/2016 0426 01/05/2016 1215 01/19/16 0419  CALCIUM 7.0* 6.3* 6.3* 6.2*  MG 2.3 2.4  --  2.1  PHOS 3.5 7.6*  --  4.6    CBC  Recent Labs Lab 01/16/16 0455 01/17/16 0450 12/25/2015 0426  WBC 25.7* 42.7* 49.3*  HGB 13.4  11.0* 10.9*  HCT 39.2 32.7* 32.3*  PLT 101* 87* 145*    Coag's No results for input(s): APTT, INR in the last 168 hours.  Sepsis Markers  Recent Labs Lab 01/16/16 2348 01/17/16 0751 01/17/16 1651 01/21/2016 0426 01/19/16 0419  LATICACIDVEN 1.7 1.6 0.9  --   --   PROCALCITON  --  165.85  --  158.93 115.41    ABG  Recent Labs Lab 01/17/16 0910 12/24/2015 0345 01/22/2016 0540  PHART 7.23* 7.13* 7.28*  PCO2ART 48 70* 45  PO2ART 90 55* 56*    Liver Enzymes  Recent Labs Lab 12/27/2015 1329 01/16/16 0455 01/19/16 0419  AST 88* 138*  --   ALT 72* 114*  --   ALKPHOS 251* 262*  --   BILITOT 2.7* 3.5*  --   ALBUMIN 2.2* 2.2* 1.3*    Cardiac Enzymes  Recent Labs Lab 01/20/2016 2121 01/16/16 0144 01/16/16 0455  TROPONINI 0.14* 0.26* 0.11*    Glucose  Recent Labs Lab 12/29/2015 1220 01/11/2016 1541 01/17/2016 1949 12/28/2015 2342 01/19/16 0401 01/19/16 0735  GLUCAP 335* 264* 316* 311* 297* 274*   I have personally obtained a history, examined the patient, evaluated Pertinent laboratory and RadioGraphic/imaging results, and  formulated the assessment and plan  The Patient requires high complexity decision making for assessment and support, frequent evaluation and titration of therapies, application of advanced monitoring technologies and extensive interpretation of multiple databases. Critical Care  Time devoted to patient care services described in this note is 40 minutes.   Overall, patient is critically ill, prognosis is guarded.     Corrin Parker, M.D.  [facility]  Medical Director [place] Director [facility] Cardio-Pulmonary Department

## 2016-01-19 NOTE — Progress Notes (Signed)
Dialysis started 

## 2016-01-19 NOTE — Progress Notes (Signed)
Pre Dialysis 

## 2016-01-19 NOTE — Progress Notes (Signed)
Inpatient Diabetes Program Recommendations  AACE/ADA: New Consensus Statement on Inpatient Glycemic Control (2015)  Target Ranges:  Prepandial:   less than 140 mg/dL      Peak postprandial:   less than 180 mg/dL (1-2 hours)      Critically ill patients:  140 - 180 mg/dL   Results for Heidi Villegas, Heidi Villegas (MRN [id number]) as of 01/19/2016 08:48  Ref. Range 01/17/2016 23:32 01/14/2016 03:28 01/03/2016 07:21 01/16/2016 12:20 01/21/2016 15:41 01/22/2016 19:49  Glucose-Capillary Latest Ref Range: 65 - 99 mg/dL 270 (H) 291 (H) 322 (H) 335 (H) 264 (H) 316 (H)   Results for Heidi Villegas, Heidi Villegas (MRN [id number]) as of 01/19/2016 08:48  Ref. Range 12/30/2015 23:42 01/19/2016 04:01 01/19/2016 07:35  Glucose-Capillary Latest Ref Range: 65 - 99 mg/dL 311 (H) 297 (H) 274 (H)    Admit with: Sepsis  History: DM  Home DM Meds: Metformin 500 mg daily  Current Insulin Orders: Novolog Resistant Correction Scale/ SSI (0-20 units) Q4 hours      -Note patient currently Intubated.  Receiving Vital HP tube feeds at 60cc/hour.  -Underwent dialysis yesterday for worsening renal function.  -Glucose levels consistently >250 mg/dl.       MD- Please consider stopping Novolog Resistant Correction Scale/ SSI (0-20 units) Q4 hours and Initiating ICU Glycemic Control Protocol- Phase 2 IV Insulin drip  Patient meets all criteria for ICU Glycemic Control Protocol.      --Will follow patient during hospitalization--  Wyn Quaker RN, MSN, CDE Diabetes Coordinator Inpatient Glycemic Control Team Team Pager: [phone] (8a-5p)

## 2016-01-19 NOTE — Progress Notes (Signed)
Pt very agitated, asynchronous w/ vent, increased work of breathing, sats low to mid 80s. FiO2 increased by RT. New orders received: PRN Vecuronium, insulin & Fentanyl gtt, Rocephin, wean propofol & Levo gtts. Vercuronium given @ 1056. Pt calm, w/ no movement and synchronous w/ vent at 1100.

## 2016-01-19 NOTE — Progress Notes (Signed)
[facility]  ROUNDING NOTE   Subjective:  Patient remains critically ill at this point in time. She remains on the ventilator and FiO2 is currently 60%. BUN is currently down to 70 with a creatinine of 2.89. Urine output was only 50 cc over the preceding 24 hours. Serum sodium up to 129.  Objective:  Vital signs in last 24 hours:  Temp:  [98.7 F (37.1 C)-100.6 F (38.1 C)] 98.7 F (37.1 C) (11/27 0400) Pulse Rate:  [103-129] 112 (11/27 0900) Resp:  [16-33] 21 (11/27 0900) BP: (81-137)/(60-88) 137/70 (11/27 0900) SpO2:  [93 %-98 %] 93 % (11/27 0900) FiO2 (%):  [60 %-80 %] 60 % (11/27 0836) Weight:  [101.4 kg (223 lb 8.7 oz)-103.9 kg (229 lb 0.9 oz)] 101.4 kg (223 lb 8.7 oz) (11/27 0500)  Weight change: 2.5 kg (5 lb 8.2 oz) Filed Weights   01/13/2016 0500 01/04/2016 1300 01/19/16 0500  Weight: 101.4 kg (223 lb 8.7 oz) 103.9 kg (229 lb 0.9 oz) 101.4 kg (223 lb 8.7 oz)    Intake/Output: I/O last 3 completed shifts: In: 8032 [I.V.:5591.3; NG/GT:1271.7; IV Piggyback:510] Out: 210 [Urine:210]   Intake/Output this shift:  Total I/O In: 376 [I.V.:256; NG/GT:120] Out: -   Physical Exam: General: Critically ill appearing   Head: Endotracheal tube and OG in place.   Eyes: Eyes closed   Neck: Supple, trachea midline  Lungs:  Scattered rhonchi bilateral, vent assisted   Heart: S1S2 no rubs  Abdomen:  Distended, bowel sounds present   Extremities: Trace peripheral edema.  Neurologic: Not following commands at the moment.   Skin: No lesions  Access: Right femoral temporary dialysis catheter     Basic Metabolic Panel:  Recent Labs Lab 01/16/16 0455  01/16/16 2348 01/17/16 0450 12/31/2015 0426 01/06/2016 1215 01/19/16 0419  NA 132*  < > 134* 132* 127* 127* 129*  K 3.2*  < > 3.3* 3.5 4.2 3.8 3.4*  CL 101  < > 100* 98* 91* 90* 94*  CO2 17*  < > 20* 18* 23 21* 24  GLUCOSE 158*  < > 208* 245* 326* 322* 288*  BUN 100*  < > 99* 102* 99* 98* 70*  CREATININE 2.82*  <  > 3.05* 3.06* 3.49* 3.65* 2.89*  CALCIUM 8.0*  < > 7.3* 7.0* 6.3* 6.3* 6.2*  MG 2.3  --  2.4 2.3 2.4  --  2.1  PHOS  --   --  3.4 3.5 7.6*  --  4.6  < > = values in this interval not displayed.  Liver Function Tests:  Recent Labs Lab 12/29/2015 1329 01/16/16 0455 01/19/16 0419  AST 88* 138*  --   ALT 72* 114*  --   ALKPHOS 251* 262*  --   BILITOT 2.7* 3.5*  --   PROT 6.3* 6.1*  --   ALBUMIN 2.2* 2.2* 1.3*   No results for input(s): LIPASE, AMYLASE in the last 168 hours. No results for input(s): AMMONIA in the last 168 hours.  CBC:  Recent Labs Lab 12/25/2015 1329 01/16/16 0455 01/17/16 0450 01/20/2016 0426  WBC 30.8* 25.7* 42.7* 49.3*  NEUTROABS 27.8*  --   --   --   HGB 14.1 13.4 11.0* 10.9*  HCT 41.4 39.2 32.7* 32.3*  MCV 84.3 84.8 83.7 84.4  PLT 99* 101* 87* 145*    Cardiac Enzymes:  Recent Labs Lab 01/19/2016 1329 12/24/2015 2121 01/16/16 0144 01/16/16 0455  TROPONINI 0.17* 0.14* 0.26* 0.11*    BNP: Invalid input(s): POCBNP  CBG:  Recent Labs Lab 01/20/2016 1541 12/26/2015 1949 01/08/2016 2342 01/19/16 0401 01/19/16 0735  GLUCAP 264* 316* 311* 297* 274*    Microbiology: Results for orders placed or performed during the hospital encounter of 12/29/2015  Urine culture     Status: Abnormal   Collection Time: 01/01/2016  1:29 PM  Result Value Ref Range Status   Specimen Description URINE, RANDOM  Final   Special Requests NONE  Final   Culture >=100,000 COLONIES/mL ESCHERICHIA COLI (A)  Final   Report Status 01/17/2016 FINAL  Final   Organism ID, Bacteria ESCHERICHIA COLI (A)  Final      Susceptibility   Escherichia coli - MIC*    AMPICILLIN >=32 RESISTANT Resistant     CEFAZOLIN <=4 SENSITIVE Sensitive     CEFTRIAXONE <=1 SENSITIVE Sensitive     CIPROFLOXACIN <=0.25 SENSITIVE Sensitive     GENTAMICIN <=1 SENSITIVE Sensitive     IMIPENEM <=0.25 SENSITIVE Sensitive     NITROFURANTOIN <=16 SENSITIVE Sensitive     TRIMETH/SULFA <=20 SENSITIVE Sensitive      AMPICILLIN/SULBACTAM >=32 RESISTANT Resistant     PIP/TAZO <=4 SENSITIVE Sensitive     Extended ESBL NEGATIVE Sensitive     * >=100,000 COLONIES/mL ESCHERICHIA COLI  Culture, blood (routine x 2)     Status: Abnormal   Collection Time: 01/06/2016  1:29 PM  Result Value Ref Range Status   Specimen Description BLOOD RIGHT HAND  Final   Special Requests   Final    BOTTLES DRAWN AEROBIC AND ANAEROBIC 11MLAERO,8MLANA   Culture  Setup Time   Final    GRAM NEGATIVE RODS IN BOTH AEROBIC AND ANAEROBIC BOTTLES CRITICAL RESULT CALLED TO, READ BACK BY AND VERIFIED WITH: NATE COOKSON AT [place] ON 01/16/16 [place]. Performed at [facility] (A)  Final   Report Status 01/19/2016 FINAL  Final   Organism ID, Bacteria ESCHERICHIA COLI  Final      Susceptibility   Escherichia coli - MIC*    AMPICILLIN >=32 RESISTANT Resistant     CEFAZOLIN <=4 SENSITIVE Sensitive     CEFEPIME <=1 SENSITIVE Sensitive     CEFTAZIDIME <=1 SENSITIVE Sensitive     CEFTRIAXONE <=1 SENSITIVE Sensitive     CIPROFLOXACIN <=0.25 SENSITIVE Sensitive     GENTAMICIN <=1 SENSITIVE Sensitive     IMIPENEM <=0.25 SENSITIVE Sensitive     TRIMETH/SULFA <=20 SENSITIVE Sensitive     AMPICILLIN/SULBACTAM >=32 RESISTANT Resistant     PIP/TAZO <=4 SENSITIVE Sensitive     Extended ESBL NEGATIVE Sensitive     * ESCHERICHIA COLI  Culture, blood (routine x 2)     Status: Abnormal   Collection Time: 12/26/2015  1:29 PM  Result Value Ref Range Status   Specimen Description BLOOD LEFT ANTECUBITAL  Final   Special Requests   Final    BOTTLES DRAWN AEROBIC AND ANAEROBIC AER 12ML ANA 11ML   Culture  Setup Time   Final    GRAM NEGATIVE RODS IN BOTH AEROBIC AND ANAEROBIC BOTTLES CRITICAL VALUE NOTED.  VALUE IS CONSISTENT WITH PREVIOUSLY REPORTED AND CALLED VALUE. GRAM POSITIVE COCCI AEROBIC BOTTLE ONLY CRITICAL RESULT CALLED TO, READ BACK BY AND VERIFIED WITH: Charlane Ferretti @ 7517 01/16/16 by [facility]    Culture (A)   Final    ESCHERICHIA COLI SUSCEPTIBILITIES PERFORMED ON PREVIOUS CULTURE WITHIN THE LAST 5 DAYS. STAPHYLOCOCCUS SPECIES (COAGULASE NEGATIVE) THE SIGNIFICANCE OF ISOLATING THIS ORGANISM FROM A SINGLE SET OF BLOOD CULTURES WHEN MULTIPLE  SETS ARE DRAWN IS UNCERTAIN. PLEASE NOTIFY THE MICROBIOLOGY DEPARTMENT WITHIN ONE WEEK IF SPECIATION AND SENSITIVITIES ARE REQUIRED. Performed at [facility]    Report Status 01/05/2016 FINAL  Final  Blood Culture ID Panel (Reflexed)     Status: Abnormal   Collection Time: 01/11/2016  1:29 PM  Result Value Ref Range Status   Enterococcus species NOT DETECTED NOT DETECTED Final   Listeria monocytogenes NOT DETECTED NOT DETECTED Final   Staphylococcus species NOT DETECTED NOT DETECTED Final   Staphylococcus aureus NOT DETECTED NOT DETECTED Final   Streptococcus species NOT DETECTED NOT DETECTED Final   Streptococcus agalactiae NOT DETECTED NOT DETECTED Final   Streptococcus pneumoniae NOT DETECTED NOT DETECTED Final   Streptococcus pyogenes NOT DETECTED NOT DETECTED Final   Acinetobacter baumannii NOT DETECTED NOT DETECTED Final   Enterobacteriaceae species DETECTED (A) NOT DETECTED Final    Comment: CRITICAL RESULT CALLED TO, READ BACK BY AND VERIFIED WITH: NATE COOKSON AT 0556 ON 01/16/16 [place].    Enterobacter cloacae complex NOT DETECTED NOT DETECTED Final   Escherichia coli DETECTED (A) NOT DETECTED Final    Comment: CRITICAL RESULT CALLED TO, READ BACK BY AND VERIFIED WITH: NATE COOKSON AT 7939 ON 01/16/16 [place].    Klebsiella oxytoca NOT DETECTED NOT DETECTED Final   Klebsiella pneumoniae NOT DETECTED NOT DETECTED Final   Proteus species NOT DETECTED NOT DETECTED Final   Serratia marcescens NOT DETECTED NOT DETECTED Final   Carbapenem resistance NOT DETECTED NOT DETECTED Final   Haemophilus influenzae NOT DETECTED NOT DETECTED Final   Neisseria meningitidis NOT DETECTED NOT DETECTED Final   Pseudomonas aeruginosa NOT DETECTED NOT DETECTED  Final   Candida albicans NOT DETECTED NOT DETECTED Final   Candida glabrata NOT DETECTED NOT DETECTED Final   Candida krusei NOT DETECTED NOT DETECTED Final   Candida parapsilosis NOT DETECTED NOT DETECTED Final   Candida tropicalis NOT DETECTED NOT DETECTED Final  Blood Culture ID Panel (Reflexed)     Status: Abnormal   Collection Time: 12/24/2015  1:40 PM  Result Value Ref Range Status   Enterococcus species NOT DETECTED NOT DETECTED Final   Vancomycin resistance NOT DETECTED NOT DETECTED Final   Listeria monocytogenes NOT DETECTED NOT DETECTED Final   Staphylococcus species DETECTED (A) NOT DETECTED Final    Comment: CRITICAL RESULT CALLED TO, READ BACK BY AND VERIFIED WITH: Charlane Ferretti @ 0300 01/16/16 by [facility].    Staphylococcus aureus NOT DETECTED NOT DETECTED Final   Methicillin resistance NOT DETECTED NOT DETECTED Final   Streptococcus species NOT DETECTED NOT DETECTED Final   Streptococcus agalactiae NOT DETECTED NOT DETECTED Final   Streptococcus pneumoniae NOT DETECTED NOT DETECTED Final   Streptococcus pyogenes NOT DETECTED NOT DETECTED Final   Acinetobacter baumannii NOT DETECTED NOT DETECTED Final   Enterobacteriaceae species DETECTED (A) NOT DETECTED Final    Comment: CRITICAL RESULT CALLED TO, READ BACK BY AND VERIFIED WITH: Charlane Ferretti @ 9233 01/16/16 by [facility].    Enterobacter cloacae complex NOT DETECTED NOT DETECTED Final   Escherichia coli DETECTED (A) NOT DETECTED Final    Comment: CRITICAL RESULT CALLED TO, READ BACK BY AND VERIFIED WITH: Charlane Ferretti @ 0076 01/16/16 by [facility].    Klebsiella oxytoca NOT DETECTED NOT DETECTED Final   Klebsiella pneumoniae NOT DETECTED NOT DETECTED Final   Proteus species NOT DETECTED NOT DETECTED Final   Serratia marcescens NOT DETECTED NOT DETECTED Final   Carbapenem resistance NOT DETECTED NOT DETECTED Final   Haemophilus influenzae NOT DETECTED  NOT DETECTED Final   Neisseria meningitidis NOT DETECTED NOT DETECTED Final    Pseudomonas aeruginosa NOT DETECTED NOT DETECTED Final   Candida albicans NOT DETECTED NOT DETECTED Final   Candida glabrata NOT DETECTED NOT DETECTED Final   Candida krusei NOT DETECTED NOT DETECTED Final   Candida parapsilosis NOT DETECTED NOT DETECTED Final   Candida tropicalis NOT DETECTED NOT DETECTED Final  MRSA PCR Screening     Status: None   Collection Time: 01/16/2016  7:00 PM  Result Value Ref Range Status   MRSA by PCR NEGATIVE NEGATIVE Final    Comment:        The GeneXpert MRSA Assay (FDA approved for NASAL specimens only), is one component of a comprehensive MRSA colonization surveillance program. It is not intended to diagnose MRSA infection nor to guide or monitor treatment for MRSA infections.     Coagulation Studies: No results for input(s): LABPROT, INR in the last 72 hours.  Urinalysis: No results for input(s): COLORURINE, LABSPEC, PHURINE, GLUCOSEU, HGBUR, BILIRUBINUR, KETONESUR, PROTEINUR, UROBILINOGEN, NITRITE, LEUKOCYTESUR in the last 72 hours.  Invalid input(s): APPERANCEUR    Imaging: Ct Abdomen Pelvis Wo Contrast  Result Date: 01/17/2016 CLINICAL DATA:  Follow-up hydronephrosis EXAM: CT ABDOMEN AND PELVIS WITHOUT CONTRAST TECHNIQUE: Multidetector CT imaging of the abdomen and pelvis was performed following the standard protocol without IV contrast. COMPARISON:  Renal ultrasound 12/31/2015 FINDINGS: Lower chest: Bilateral lower lobe posterior consolidation with air bronchogram highly suspicious for pneumonia Hepatobiliary: Mild hepatomegaly. No intrahepatic biliary ductal dilatation. There is mild distended gallbladder. Partially calcified solitary gallstone within gallbladder measures 2.5 cm. Pancreas: Unenhanced pancreas shows no focal abnormality. Spleen: Unenhanced spleen is unremarkable. Adrenals/Urinary Tract: Right adrenal gland is unremarkable. There is a low-density nodule left adrenal gland measures 1.6 cm probable adenoma. Mild bilateral  perinephric stranding. There is mild to moderate left hydronephrosis. Axial image 43 there is 6 mm calcified obstructive calculus in proximal left ureter at the level of upper endplate of L4 vertebral body. Bilateral distal ureter is small caliber. There is a Foley catheter within decompressed urinary bladder. Stomach/Bowel: Oral contrast material was given to the patient. No small bowel obstruction. No gastric outlet obstruction. There is no pericecal inflammation. Some colonic gas noted in right colon. Descending colon and sigmoid colon are empty collapsed. Few diverticula are noted descending colon and sigmoid colon. No evidence of acute diverticulitis or acute colitis. A rectal catheter is noted. Vascular/Lymphatic: Atherosclerotic calcifications of abdominal aorta and iliac arteries. No adenopathy. No aortic aneurysm. Reproductive: The patient is status post hysterectomy. Other: Mild anasarca infiltration of subcutaneous fat bilateral flank wall and lateral pelvic wall. Small ascites noted in left paracolic gutter. No evidence of free abdominal air. Musculoskeletal: No destructive bony lesions are noted. Degenerative changes thoracolumbar spine. There is about 4 mm anterolisthesis L5 on S1 vertebral body. Disc space flattening with vacuum disc phenomenon at L5-S1 level. IMPRESSION: 1. Bilateral mild perinephric stranding. There is mild to moderate left hydronephrosis. Axial image 43 there is 6 mm calcified obstructive calculus in proximal left ureter at the level of upper endplate of L4 vertebral body. 2. There is a Foley catheter within decompressed urinary bladder. 3. No small bowel obstruction. 4. Few diverticula are noted descending colon. No evidence of acute colitis or diverticulitis. 5. Status post hysterectomy. 6. Degenerative changes thoracolumbar spine. 7. There is about 4 mm anterolisthesis L5 on S1 vertebral body. 8. Bilateral lower lobe posterior consolidation with air bronchogram highly suspicious  for pneumonia. Electronically Signed  By: Lahoma Crocker M.D.   On: 01/17/2016 14:12   Dg Abd 1 View  Result Date: 01/02/2016 CLINICAL DATA:  Abdominal distension EXAM: ABDOMEN - 1 VIEW COMPARISON:  01/17/2016 FINDINGS: Scattered large and small bowel gas is noted. No obstructive changes are seen. A left ureteral stent is now noted. The previously seen proximal ureteral stone is not well appreciated on this study. No free air is seen. IMPRESSION: Left ureteral stent. No acute abnormality is noted. Electronically Signed   By: Inez Catalina M.D.   On: 01/20/2016 07:26   Portable Chest Xray  Result Date: 01/14/2016 CLINICAL DATA:  Sepsis EXAM: PORTABLE CHEST 1 VIEW COMPARISON:  01/17/2016 FINDINGS: Cardiac shadow is enlarged but stable. A right jugular central line, endotracheal tube and nasogastric catheter are seen in satisfactory position. Patchy infiltrative changes are seen in the bases as well as the left perihilar region. No pneumothorax is seen. No bony abnormality is noted. IMPRESSION: Bilateral patchy opacities slightly increased in the right lung base when compared with the prior exam. Electronically Signed   By: Inez Catalina M.D.   On: 01/03/2016 07:22     Medications:   . sodium chloride 50 mL/hr at 01/19/16 0900  . amiodarone 30 mg/hr (01/19/16 0900)  . norepinephrine 12 mcg/min (01/19/16 0900)  . propofol (DIPRIVAN) infusion Stopped (01/19/16 0906)   . amLODipine  2.5 mg Oral Daily  . aspirin EC  81 mg Oral Daily  . atorvastatin  80 mg Oral QPM  . B-complex with vitamin C  1 tablet Oral Daily  . chlorhexidine gluconate (MEDLINE KIT)  15 mL Mouth Rinse BID  . cholecalciferol  1,000 Units Oral Daily  . famotidine (PEPCID) IV  20 mg Intravenous Q48H  . feeding supplement (VITAL HIGH PROTEIN)  1,000 mL Per Tube Q24H  . heparin subcutaneous  5,000 Units Subcutaneous Q8H  . insulin aspart  0-20 Units Subcutaneous Q4H  . ipratropium-albuterol  3 mL Nebulization Q6H  . mouth rinse  15  mL Mouth Rinse QID  . meropenem  500 mg Intravenous Q12H  . pantoprazole (PROTONIX) IV  40 mg Intravenous Daily  . potassium chloride SA  20 mEq Oral Daily  . sodium chloride flush  10-40 mL Intracatheter Q12H   acetaminophen, bisacodyl, fentaNYL, ipratropium-albuterol, metoprolol, midazolam, midazolam, ondansetron **OR** ondansetron (ZOFRAN) IV, sennosides, sodium chloride flush, traMADol  Assessment/ Plan:  75 y.o. Caucasian female with medical problems of diabetes, hypertension, atrial fibrillation, was admitted on 01/02/2016 with sepsis from urinary source and bilateral pneumonia.   1. Acute renal failure, likely ATN from concurrent illness 2. Bilateral pneumonia 3. Acute respiratory failure requiring ventilator support 4. Sepsis. Escherichia coli in blood and urine. Hypotension requiring pressors 5. Hypocalcemia,  6. 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 Creatinine baseline 1.1 from October 2017. GFR 49 7. Acidosis, mixed respiratory and metabolic 8. Left hydronephrosis from 6 mm stone. Urgent stent placement early morning 11/26  Plan:  The patient remained critically ill at this point in time. She underwent hemodialysis yesterday which she tolerated well. We will plan for another dialysis treatment today. She remains oliguric at this point in time and has ongoing sepsis. Continue ventilatory support as recommended by pulmonary critical care.  Patient remains on meropenem 500 mg IV every 12 hours. Pharmacy at on the case and following dosing of antibiotics. Overall guarded prognosis at the moment.   LOS: 4 Jeymi Hepp 11/27/20179:31 AM

## 2016-01-19 NOTE — Progress Notes (Addendum)
Pharmacy Antibiotic Note/ Electrolyte Monitoring  Heidi Villegas is a 75 y.o. female admitted on 01/16/2016 with sepsis.  Pharmacy has been consulted for ceftriaxone dosing.  Pharmacy also consulted to assist in electrolyte monitoring. Pt started HD 11/26  Plan: 1. Antibiotics Will transition patient from meropenem 500 mg IV q 12 hours to ceftriaxone 2 g IV q 24 hours for E.coli bacteremia/UTI Spoke with MD about coagulase negative staph in 1/2 bottles, likely contaminant   Will closely monitor renal function for vancomycin dosing.  2. Electrolytes 11/27 0500 K = 3.4 and corrected Ca = 8.36 Pt has KCl 20 mEq daily ordered starting today  Replaced Ca conservatively given renal function. Calcium carbonate suspension 500 mg PO x 1 was ordered  Will f/u AM labs and continue to replace conservatively.  Height: 5\' 1"  (154.9 cm) Weight: 223 lb 8.7 oz (101.4 kg) IBW/kg (Calculated) : 47.8  Temp (24hrs), Avg:99.7 F (37.6 C), Min:98.7 F (37.1 C), Max:100.6 F (38.1 C)   Recent Labs Lab 12/29/2015 1329  01/10/2016 2121 01/16/16 0455  01/16/16 2348 01/17/16 0450 01/17/16 0751 01/17/16 1651 01/18/16 0426 01/18/16 1215 01/19/16 0419  WBC 30.8*  --   --  25.7*  --   --  42.7*  --   --  49.3*  --   --   CREATININE 3.29*  --   --  2.82*  < > 3.05* 3.06*  --   --  3.49* 3.65* 2.89*  LATICACIDVEN 3.8*  < > 2.1* 2.2*  --  1.7  --  1.6 0.9  --   --   --   < > = values in this interval not displayed.  Estimated Creatinine Clearance: 18.9 mL/min (by C-G formula based on SCr of 2.89 mg/dL (H)).    Allergies  Allergen Reactions  . Codeine Nausea And Vomiting  . Sulfa Antibiotics     Antimicrobials this admission: Piperacillin/tazobactam 11/23 >> 11/24 Vancomycin 11/23 >> 11/24, 11/25 >> 11/26 Meropenem 11/24 >> 11/27 Ceftriaxone 11/27 >>  Microbiology results: 11/23 BCx: E coli 2/2, staph 1/2, S ceftriaxone 11/23 UCx: E coli resistant to ampicillin and amp/sulbactam 11/23 Sputum:  pending  11/23 MRSA PCR: negative  Thank you for allowing pharmacy to be a part of this patient's care.  Darrow Bussing, PharmD Pharmacy Resident 01/19/2016 2:57 PM

## 2016-01-19 NOTE — Progress Notes (Signed)
Post dialysis 

## 2016-01-19 NOTE — Progress Notes (Signed)
Nutrition Follow-up  DOCUMENTATION CODES:   Obesity unspecified  INTERVENTION:  -Recommend continuing current TF regimen; continue to assess  NUTRITION DIAGNOSIS:   Inadequate oral intake related to inability to eat as evidenced by NPO status.  Being addressed via TF  GOAL:   Provide needs based on ASPEN/SCCM guidelines  Met  MONITOR:   Vent status, Labs, Weight trends, TF tolerance, I & O's  REASON FOR ASSESSMENT:   Consult, Ventilator Enteral/tube feeding initiation and management  ASSESSMENT:   75 y/o caucasian female presenting with urosepsis, septic shock and acute hypoxic respiratory failure necessitating intubation. CT abdomen 11/25 shows obstructing left renal calculi and hydronephrosis  Pt remains on vent support, started dialysis yesterday with plans for another treatment today. Levophed at 12 mcg/min (weaning as tolerated). Per MD, plan to wean off diprivan today. Labs: FSBS 200s-300s (insulin drip starting today), sodium 129 (improving), potassium 3.4, phosphorus wdl, corrected calcium 8.4 (albumin 1.3) Meds:  reviewed  Diet Order:  Diet NPO time specified  Skin:  Reviewed, no issues  Last BM:  11/27  Height:   Ht Readings from Last 1 Encounters:  01/04/2016 5' 1"  (1.549 m)    Weight:   Wt Readings from Last 1 Encounters:  01/19/16 223 lb 8.7 oz (101.4 kg)    Ideal Body Weight:  47.72 kg  BMI:  Body mass index is 42.24 kg/m.  Estimated Nutritional Needs:   Kcal:  2903-7955 calories  Protein:  122 - 152 gm  Fluid:  Per MD/NP/PA  EDUCATION NEEDS:   No education needs identified at this time  [name] [place], [place], [place] [phone] Pager  [phone] Weekend/On-Call Pager

## 2016-01-19 NOTE — Progress Notes (Signed)
[facility] Cardiology  SUBJECTIVE: Intubated   Vitals:   01/19/16 0836 01/19/16 0900 01/19/16 1000 01/19/16 1110  BP:  137/70 (!) 157/81   Pulse:  (!) 112 (!) 116   Resp:  (!) 21 (!) 24   Temp:      TempSrc:      SpO2: 96% 93% 91% (!) 89%  Weight:      Height:         Intake/Output Summary (Last 24 hours) at 01/19/16 1224 Last data filed at 01/19/16 1000  Gross per 24 hour  Intake          4046.15 ml  Output               50 ml  Net          3996.15 ml      PHYSICAL EXAM  General: Intubated HEENT:  Normocephalic and atramatic Neck:  No JVD.  Lungs: Clear bilaterally to auscultation and percussion. Heart: Irregularly irregular rhythm Abdomen: Bowel sounds are positive, abdomen soft and non-tender  Msk:  Back normal, normal gait. Normal strength and tone for age. Extremities: No clubbing, cyanosis or edema.   Neuro: Intubated. Psych:  Sedated   LABS: Basic Metabolic Panel:  Recent Labs  12/27/2015 0426 01/05/2016 1215 01/19/16 0419  NA 127* 127* 129*  K 4.2 3.8 3.4*  CL 91* 90* 94*  CO2 23 21* 24  GLUCOSE 326* 322* 288*  BUN 99* 98* 70*  CREATININE 3.49* 3.65* 2.89*  CALCIUM 6.3* 6.3* 6.2*  MG 2.4  --  2.1  PHOS 7.6*  --  4.6   Liver Function Tests:  Recent Labs  01/19/16 0419  ALBUMIN 1.3*   No results for input(s): LIPASE, AMYLASE in the last 72 hours. CBC:  Recent Labs  01/17/16 0450 12/30/2015 0426  WBC 42.7* 49.3*  HGB 11.0* 10.9*  HCT 32.7* 32.3*  MCV 83.7 84.4  PLT 87* 145*   Cardiac Enzymes: No results for input(s): CKTOTAL, CKMB, CKMBINDEX, TROPONINI in the last 72 hours. BNP: Invalid input(s): POCBNP D-Dimer: No results for input(s): DDIMER in the last 72 hours. Hemoglobin A1C: No results for input(s): HGBA1C in the last 72 hours. Fasting Lipid Panel:  Recent Labs  01/17/16 0751  TRIG 150*   Thyroid Function Tests: No results for input(s): TSH, T4TOTAL, T3FREE, THYROIDAB in the last 72 hours.  Invalid input(s):  FREET3 Anemia Panel: No results for input(s): VITAMINB12, FOLATE, FERRITIN, TIBC, IRON, RETICCTPCT in the last 72 hours.  Ct Abdomen Pelvis Wo Contrast  Result Date: 01/17/2016 CLINICAL DATA:  Follow-up hydronephrosis EXAM: CT ABDOMEN AND PELVIS WITHOUT CONTRAST TECHNIQUE: Multidetector CT imaging of the abdomen and pelvis was performed following the standard protocol without IV contrast. COMPARISON:  Renal ultrasound 01/19/2016 FINDINGS: Lower chest: Bilateral lower lobe posterior consolidation with air bronchogram highly suspicious for pneumonia Hepatobiliary: Mild hepatomegaly. No intrahepatic biliary ductal dilatation. There is mild distended gallbladder. Partially calcified solitary gallstone within gallbladder measures 2.5 cm. Pancreas: Unenhanced pancreas shows no focal abnormality. Spleen: Unenhanced spleen is unremarkable. Adrenals/Urinary Tract: Right adrenal gland is unremarkable. There is a low-density nodule left adrenal gland measures 1.6 cm probable adenoma. Mild bilateral perinephric stranding. There is mild to moderate left hydronephrosis. Axial image 43 there is 6 mm calcified obstructive calculus in proximal left ureter at the level of upper endplate of L4 vertebral body. Bilateral distal ureter is small caliber. There is a Foley catheter within decompressed urinary bladder. Stomach/Bowel: Oral contrast material was given to the patient.  No small bowel obstruction. No gastric outlet obstruction. There is no pericecal inflammation. Some colonic gas noted in right colon. Descending colon and sigmoid colon are empty collapsed. Few diverticula are noted descending colon and sigmoid colon. No evidence of acute diverticulitis or acute colitis. A rectal catheter is noted. Vascular/Lymphatic: Atherosclerotic calcifications of abdominal aorta and iliac arteries. No adenopathy. No aortic aneurysm. Reproductive: The patient is status post hysterectomy. Other: Mild anasarca infiltration of subcutaneous  fat bilateral flank wall and lateral pelvic wall. Small ascites noted in left paracolic gutter. No evidence of free abdominal air. Musculoskeletal: No destructive bony lesions are noted. Degenerative changes thoracolumbar spine. There is about 4 mm anterolisthesis L5 on S1 vertebral body. Disc space flattening with vacuum disc phenomenon at L5-S1 level. IMPRESSION: 1. Bilateral mild perinephric stranding. There is mild to moderate left hydronephrosis. Axial image 43 there is 6 mm calcified obstructive calculus in proximal left ureter at the level of upper endplate of L4 vertebral body. 2. There is a Foley catheter within decompressed urinary bladder. 3. No small bowel obstruction. 4. Few diverticula are noted descending colon. No evidence of acute colitis or diverticulitis. 5. Status post hysterectomy. 6. Degenerative changes thoracolumbar spine. 7. There is about 4 mm anterolisthesis L5 on S1 vertebral body. 8. Bilateral lower lobe posterior consolidation with air bronchogram highly suspicious for pneumonia. Electronically Signed   By: Lahoma Crocker M.D.   On: 01/17/2016 14:12   Dg Abd 1 View  Result Date: 01/16/2016 CLINICAL DATA:  Abdominal distension EXAM: ABDOMEN - 1 VIEW COMPARISON:  01/17/2016 FINDINGS: Scattered large and small bowel gas is noted. No obstructive changes are seen. A left ureteral stent is now noted. The previously seen proximal ureteral stone is not well appreciated on this study. No free air is seen. IMPRESSION: Left ureteral stent. No acute abnormality is noted. Electronically Signed   By: Inez Catalina M.D.   On: 01/19/2016 07:26   Portable Chest Xray  Result Date: 12/30/2015 CLINICAL DATA:  Sepsis EXAM: PORTABLE CHEST 1 VIEW COMPARISON:  01/17/2016 FINDINGS: Cardiac shadow is enlarged but stable. A right jugular central line, endotracheal tube and nasogastric catheter are seen in satisfactory position. Patchy infiltrative changes are seen in the bases as well as the left perihilar  region. No pneumothorax is seen. No bony abnormality is noted. IMPRESSION: Bilateral patchy opacities slightly increased in the right lung base when compared with the prior exam. Electronically Signed   By: Inez Catalina M.D.   On: 01/17/2016 07:22     Echo pending  TELEMETRY: Atrial fibrillation:  ASSESSMENT AND PLAN:  Active Problems:   Septic shock ([place])   Sepsis ([place])   Urinary tract infection without hematuria   Community acquired pneumonia of left lung   Acute renal failure (HCC)   Acute respiratory failure with hypoxia (HCC)   AKI (acute kidney injury) ([place])    1. Atrial fibrillation with a rapid ventricular rate, reasonably controlled on amiodarone drip 2. Borderline elevated troponin, demand supply ischemia 3. Respiratory failure, intubated 4. Acute renal failure, receiving hemodialysis 5. Urosepsis/septic shock  Recommendations  1. Agree with current therapy 2. Continue amiodarone drip for rate control for now 3. Review 2-D echocardiogram 4. Defer full dose anticoagulation   Isaias Cowman, MD, PhD, [facility] 01/19/2016 12:24 PM

## 2016-01-20 ENCOUNTER — Inpatient Hospital Stay: Payer: PPO

## 2016-01-20 ENCOUNTER — Inpatient Hospital Stay (HOSPITAL_COMMUNITY): Payer: PPO

## 2016-01-20 LAB — GLUCOSE, CAPILLARY
GLUCOSE-CAPILLARY: 121 mg/dL — AB (ref 65–99)
GLUCOSE-CAPILLARY: 130 mg/dL — AB (ref 65–99)
GLUCOSE-CAPILLARY: 133 mg/dL — AB (ref 65–99)
GLUCOSE-CAPILLARY: 136 mg/dL — AB (ref 65–99)
GLUCOSE-CAPILLARY: 137 mg/dL — AB (ref 65–99)
GLUCOSE-CAPILLARY: 142 mg/dL — AB (ref 65–99)
GLUCOSE-CAPILLARY: 145 mg/dL — AB (ref 65–99)
GLUCOSE-CAPILLARY: 152 mg/dL — AB (ref 65–99)
GLUCOSE-CAPILLARY: 163 mg/dL — AB (ref 65–99)
GLUCOSE-CAPILLARY: 174 mg/dL — AB (ref 65–99)
GLUCOSE-CAPILLARY: 176 mg/dL — AB (ref 65–99)
GLUCOSE-CAPILLARY: 215 mg/dL — AB (ref 65–99)
GLUCOSE-CAPILLARY: 225 mg/dL — AB (ref 65–99)
GLUCOSE-CAPILLARY: 228 mg/dL — AB (ref 65–99)
Glucose-Capillary: 206 mg/dL — ABNORMAL HIGH (ref 65–99)
Glucose-Capillary: 190 mg/dL — ABNORMAL HIGH (ref 65–99)
Glucose-Capillary: 183 mg/dL — ABNORMAL HIGH (ref 65–99)
Glucose-Capillary: 125 mg/dL — ABNORMAL HIGH (ref 65–99)
Glucose-Capillary: 147 mg/dL — ABNORMAL HIGH (ref 65–99)

## 2016-01-20 LAB — BASIC METABOLIC PANEL
Anion gap: 11 (ref 5–15)
BUN: 60 mg/dL — AB (ref 6–20)
CALCIUM: 6.6 mg/dL — AB (ref 8.9–10.3)
CO2: 24 mmol/L (ref 22–32)
Chloride: 97 mmol/L — ABNORMAL LOW (ref 101–111)
Creatinine, Ser: 2.81 mg/dL — ABNORMAL HIGH (ref 0.44–1.00)
GFR calc Af Amer: 18 mL/min — ABNORMAL LOW (ref >60)
GFR, EST NON AFRICAN AMERICAN: 16 mL/min — AB (ref >60)
GLUCOSE: 166 mg/dL — AB (ref 65–99)
POTASSIUM: 4.1 mmol/L (ref 3.5–5.1)
SODIUM: 132 mmol/L — AB (ref 135–145)

## 2016-01-20 LAB — CBC WITH DIFFERENTIAL/PLATELET
BASOS PCT: 1 %
Basophils Absolute: 0.2 10*3/uL — ABNORMAL HIGH (ref 0–0.1)
EOS ABS: 0 10*3/uL (ref 0–0.7)
Eosinophils Relative: 0 %
HCT: 29.1 % — ABNORMAL LOW (ref 35.0–47.0)
Hemoglobin: 9.7 g/dL — ABNORMAL LOW (ref 12.0–16.0)
Lymphocytes Relative: 2 %
Lymphs Abs: 0.4 10*3/uL — ABNORMAL LOW (ref 1.0–3.6)
MCH: 28.2 pg (ref 26.0–34.0)
MCHC: 33.5 g/dL (ref 32.0–36.0)
MCV: 84.3 fL (ref 80.0–100.0)
MONO ABS: 0.6 10*3/uL (ref 0.2–0.9)
MONOS PCT: 2 %
NEUTROS PCT: 95 %
Neutro Abs: 23.9 10*3/uL — ABNORMAL HIGH (ref 1.4–6.5)
PLATELETS: 66 10*3/uL — AB (ref 150–440)
RBC: 3.45 MIL/uL — ABNORMAL LOW (ref 3.80–5.20)
RDW: 16.2 % — AB (ref 11.5–14.5)
WBC: 25.1 10*3/uL — ABNORMAL HIGH (ref 3.6–11.0)

## 2016-01-20 LAB — RENAL FUNCTION PANEL
ALBUMIN: 1.3 g/dL — AB (ref 3.5–5.0)
ALBUMIN: 1.2 g/dL — AB (ref 3.5–5.0)
ANION GAP: 13 (ref 5–15)
ANION GAP: 10 (ref 5–15)
Albumin: 1.1 g/dL — ABNORMAL LOW (ref 3.5–5.0)
Albumin: 1.3 g/dL — ABNORMAL LOW (ref 3.5–5.0)
Anion gap: 13 (ref 5–15)
Anion gap: 11 (ref 5–15)
BUN: 69 mg/dL — ABNORMAL HIGH (ref 6–20)
BUN: 67 mg/dL — AB (ref 6–20)
BUN: 57 mg/dL — ABNORMAL HIGH (ref 6–20)
BUN: 52 mg/dL — AB (ref 6–20)
CALCIUM: 6.8 mg/dL — AB (ref 8.9–10.3)
CALCIUM: 6.9 mg/dL — AB (ref 8.9–10.3)
CALCIUM: 7.2 mg/dL — AB (ref 8.9–10.3)
CHLORIDE: 96 mmol/L — AB (ref 101–111)
CHLORIDE: 99 mmol/L — AB (ref 101–111)
CO2: 24 mmol/L (ref 22–32)
CO2: 24 mmol/L (ref 22–32)
CO2: 25 mmol/L (ref 22–32)
CO2: 25 mmol/L (ref 22–32)
CREATININE: 3.08 mg/dL — AB (ref 0.44–1.00)
CREATININE: 2.92 mg/dL — AB (ref 0.44–1.00)
CREATININE: 2.38 mg/dL — AB (ref 0.44–1.00)
Calcium: 7.4 mg/dL — ABNORMAL LOW (ref 8.9–10.3)
Chloride: 93 mmol/L — ABNORMAL LOW (ref 101–111)
Chloride: 93 mmol/L — ABNORMAL LOW (ref 101–111)
Creatinine, Ser: 2.14 mg/dL — ABNORMAL HIGH (ref 0.44–1.00)
GFR calc Af Amer: 17 mL/min — ABNORMAL LOW (ref >60)
GFR calc Af Amer: 22 mL/min — ABNORMAL LOW (ref >60)
GFR calc Af Amer: 25 mL/min — ABNORMAL LOW (ref >60)
GFR calc non Af Amer: 19 mL/min — ABNORMAL LOW (ref >60)
GFR calc non Af Amer: 22 mL/min — ABNORMAL LOW (ref >60)
GFR, EST AFRICAN AMERICAN: 16 mL/min — AB (ref >60)
GFR, EST NON AFRICAN AMERICAN: 14 mL/min — AB (ref >60)
GFR, EST NON AFRICAN AMERICAN: 15 mL/min — AB (ref >60)
GLUCOSE: 149 mg/dL — AB (ref 65–99)
GLUCOSE: 180 mg/dL — AB (ref 65–99)
GLUCOSE: 214 mg/dL — AB (ref 65–99)
Glucose, Bld: 124 mg/dL — ABNORMAL HIGH (ref 65–99)
PHOSPHORUS: 6.2 mg/dL — AB (ref 2.5–4.6)
PHOSPHORUS: 5.7 mg/dL — AB (ref 2.5–4.6)
PHOSPHORUS: 4.4 mg/dL (ref 2.5–4.6)
POTASSIUM: 4.4 mmol/L (ref 3.5–5.1)
Phosphorus: 4.7 mg/dL — ABNORMAL HIGH (ref 2.5–4.6)
Potassium: 4.5 mmol/L (ref 3.5–5.1)
Potassium: 4.4 mmol/L (ref 3.5–5.1)
Potassium: 4.3 mmol/L (ref 3.5–5.1)
SODIUM: 130 mmol/L — AB (ref 135–145)
SODIUM: 130 mmol/L — AB (ref 135–145)
SODIUM: 132 mmol/L — AB (ref 135–145)
Sodium: 134 mmol/L — ABNORMAL LOW (ref 135–145)

## 2016-01-20 LAB — BLOOD GAS, ARTERIAL
Acid-base deficit: 1.3 mmol/L (ref 0.0–2.0)
Bicarbonate: 25.2 mmol/L (ref 20.0–28.0)
FIO2: 1
O2 Saturation: 84.3 %
PEEP: 15 cmH2O
Patient temperature: 37
RATE: 30 resp/min
VT: 500 mL
pCO2 arterial: 50 mmHg — ABNORMAL HIGH (ref 32.0–48.0)
pH, Arterial: 7.31 — ABNORMAL LOW (ref 7.350–7.450)
pO2, Arterial: 54 mmHg — ABNORMAL LOW (ref 83.0–108.0)

## 2016-01-20 LAB — TRIGLYCERIDES: Triglycerides: 55 mg/dL (ref <150)

## 2016-01-20 LAB — HEPATITIS B CORE ANTIBODY, TOTAL: HEP B C TOTAL AB: NEGATIVE

## 2016-01-20 LAB — PHOSPHORUS: PHOSPHORUS: 6.5 mg/dL — AB (ref 2.5–4.6)

## 2016-01-20 LAB — HEPATITIS B SURFACE ANTIGEN: HEP B S AG: NEGATIVE

## 2016-01-20 LAB — HIV ANTIBODY (ROUTINE TESTING W REFLEX): HIV SCREEN 4TH GENERATION: NONREACTIVE

## 2016-01-20 LAB — HEPATITIS B SURFACE ANTIBODY, QUANTITATIVE: Hepatitis B-Post: <3.1 m[IU]/mL — ABNORMAL LOW (ref >9.9)

## 2016-01-20 MED ORDER — INSULIN ASPART 100 UNIT/ML ~~LOC~~ SOLN
6.0000 [IU] | SUBCUTANEOUS | Status: DC
  Administered 2016-01-20 – 2016-01-23 (×13): 6 [IU] via SUBCUTANEOUS
  Filled 2016-01-20 (×8): qty 6
  Filled 2016-01-20: qty 4
  Filled 2016-01-20 (×4): qty 6

## 2016-01-20 MED ORDER — HEPARIN SODIUM (PORCINE) 1000 UNIT/ML DIALYSIS
1000.0000 [IU] | INTRAMUSCULAR | Status: DC | PRN
  Filled 2016-01-20 (×2): qty 10

## 2016-01-20 MED ORDER — POTASSIUM CHLORIDE 20 MEQ PO PACK
20.0000 meq | PACK | Freq: Every day | ORAL | Status: DC
  Administered 2016-01-20 – 2016-01-24 (×5): 20 meq via ORAL
  Filled 2016-01-20 (×5): qty 1

## 2016-01-20 MED ORDER — FENTANYL 2500MCG IN NS 250ML (10MCG/ML) PREMIX INFUSION
25.0000 ug/h | INTRAVENOUS | Status: DC
  Administered 2016-01-20: 100 ug/h via INTRAVENOUS
  Administered 2016-01-21: 50 ug/h via INTRAVENOUS
  Administered 2016-01-22: 100 ug/h via INTRAVENOUS
  Administered 2016-01-22 (×2): 175 ug/h via INTRAVENOUS
  Administered 2016-01-23 (×2): 200 ug/h via INTRAVENOUS
  Administered 2016-01-24 (×2): 250 ug/h via INTRAVENOUS
  Administered 2016-01-25: 25 ug/h via INTRAVENOUS
  Filled 2016-01-20 (×8): qty 250

## 2016-01-20 MED ORDER — PROPOFOL 1000 MG/100ML IV EMUL
5.0000 ug/kg/min | INTRAVENOUS | Status: DC
  Administered 2016-01-20: 5 ug/kg/min via INTRAVENOUS
  Filled 2016-01-20: qty 100

## 2016-01-20 MED ORDER — INSULIN ASPART 100 UNIT/ML ~~LOC~~ SOLN
2.0000 [IU] | SUBCUTANEOUS | Status: DC
  Administered 2016-01-20: 2 [IU] via SUBCUTANEOUS
  Administered 2016-01-20 – 2016-01-21 (×3): 4 [IU] via SUBCUTANEOUS
  Administered 2016-01-21 – 2016-01-23 (×6): 2 [IU] via SUBCUTANEOUS
  Administered 2016-01-24: 6 [IU] via SUBCUTANEOUS
  Administered 2016-01-24 – 2016-01-25 (×2): 2 [IU] via SUBCUTANEOUS
  Administered 2016-01-26: 4 [IU] via SUBCUTANEOUS
  Administered 2016-01-26: 2 [IU] via SUBCUTANEOUS
  Administered 2016-01-27 (×4): 4 [IU] via SUBCUTANEOUS
  Administered 2016-01-27 (×2): 6 [IU] via SUBCUTANEOUS
  Filled 2016-01-20: qty 4
  Filled 2016-01-20 (×2): qty 2
  Filled 2016-01-20 (×3): qty 4
  Filled 2016-01-20 (×2): qty 2
  Filled 2016-01-20: qty 4
  Filled 2016-01-20 (×4): qty 2
  Filled 2016-01-20: qty 6
  Filled 2016-01-20: qty 2
  Filled 2016-01-20 (×2): qty 4
  Filled 2016-01-20 (×2): qty 6
  Filled 2016-01-20 (×2): qty 4

## 2016-01-20 MED ORDER — DEXTROSE 10 % IV SOLN: INTRAVENOUS | Status: DC | PRN

## 2016-01-20 MED ORDER — INSULIN GLARGINE 100 UNIT/ML ~~LOC~~ SOLN
35.0000 [IU] | SUBCUTANEOUS | Status: DC
  Administered 2016-01-20 – 2016-01-21 (×2): 35 [IU] via SUBCUTANEOUS
  Filled 2016-01-20 (×4): qty 0.35

## 2016-01-20 MED ORDER — ASPIRIN 81 MG PO CHEW
81.0000 mg | CHEWABLE_TABLET | Freq: Every day | ORAL | Status: DC
  Administered 2016-01-20 – 2016-01-26 (×6): 81 mg via ORAL
  Filled 2016-01-20 (×7): qty 1

## 2016-01-20 MED ORDER — NOREPINEPHRINE BITARTRATE 1 MG/ML IV SOLN
0.0000 ug/min | INTRAVENOUS | Status: DC
  Administered 2016-01-20: 9 ug/min via INTRAVENOUS
  Administered 2016-01-20: 28.053 ug/min via INTRAVENOUS
  Administered 2016-01-23: 3 ug/min via INTRAVENOUS
  Administered 2016-01-25: 2 ug/min via INTRAVENOUS
  Administered 2016-01-25: 4 ug/min via INTRAVENOUS
  Administered 2016-01-25 – 2016-01-26 (×3): 2 ug/min via INTRAVENOUS
  Filled 2016-01-20 (×4): qty 16

## 2016-01-20 MED ORDER — PUREFLOW DIALYSIS SOLUTION
INTRAVENOUS | Status: DC
Start: 2016-01-20 — End: 2016-01-27
  Administered 2016-01-20 (×2): via INTRAVENOUS_CENTRAL
  Administered 2016-01-21 – 2016-01-22 (×5): 3 via INTRAVENOUS_CENTRAL
  Administered 2016-01-22: 23:00:00 via INTRAVENOUS_CENTRAL
  Administered 2016-01-22 – 2016-01-23 (×3): 3 via INTRAVENOUS_CENTRAL
  Administered 2016-01-23: 05:00:00 via INTRAVENOUS_CENTRAL
  Administered 2016-01-23: 3 via INTRAVENOUS_CENTRAL
  Administered 2016-01-23 – 2016-01-27 (×9): via INTRAVENOUS_CENTRAL

## 2016-01-20 NOTE — Progress Notes (Signed)
CRRT started at 1300, husband updated on plan of care. No complications with CRRT. Insulin gtt d/c'd per protocol at 1833, 2 hours after lantus administration. Fentanyl ordered updated per [place], NP. Heidi Villegas

## 2016-01-20 NOTE — Progress Notes (Signed)
[facility]  ROUNDING NOTE   Subjective:  Patient still has oliguria at this point in time. Case discussed with pulmonary critical care. We have decided to proceed with continuous renal replacement therapy As the patient still remains critically ill.   Objective:  Vital signs in last 24 hours:  Temp:  [97.6 F (36.4 C)-99.7 F (37.6 C)] 99.7 F (37.6 C) (11/28 0800) Pulse Rate:  [106-122] 115 (11/28 1000) Resp:  [20-31] 30 (11/28 1000) BP: (85-154)/(45-75) 103/66 (11/28 1000) SpO2:  [87 %-93 %] 91 % (11/28 1000) FiO2 (%):  [70 %-100 %] 100 % (11/28 0239) Weight:  [108 kg (238 lb 1.6 oz)-110.2 kg (242 lb 15.2 oz)] 110.2 kg (242 lb 15.2 oz) (11/28 0447)  Weight change: 4.6 kg (10 lb 2.3 oz) Filed Weights   01/19/16 1430 01/19/16 1647 01/20/16 0447  Weight: 108.5 kg (239 lb 3.2 oz) 108 kg (238 lb 1.6 oz) 110.2 kg (242 lb 15.2 oz)    Intake/Output: I/O last 3 completed shifts: In: 29 [I.V.:3598.4; Other:120; NG/GT:2051.7; IV Piggyback:50] Out: 865 [Urine:315; Other:500; Stool:50]   Intake/Output this shift:  Total I/O In: 329 [I.V.:149; NG/GT:180] Out: -   Physical Exam: General: Critically ill appearing   Head: Endotracheal tube and OG in place.   Eyes: Eyes closed   Neck: Supple, trachea midline  Lungs:  Scattered rhonchi bilateral, vent assisted   Heart: S1S2 no rubs  Abdomen:  Distended, bowel sounds present   Extremities: 1+ peripheral edema.  Neurologic: Not following commands at the moment.   Skin: No lesions  Access: Right femoral temporary dialysis catheter     Basic Metabolic Panel:  Recent Labs Lab 01/16/16 0455  01/16/16 2348 01/17/16 0450 01/14/2016 0426 12/25/2015 1215 01/19/16 0419 01/19/16 1450 01/20/16 0501  NA 132*  < > 134* 132* 127* 127* 129*  --  132*  K 3.2*  < > 3.3* 3.5 4.2 3.8 3.4*  --  4.1  CL 101  < > 100* 98* 91* 90* 94*  --  97*  CO2 17*  < > 20* 18* 23 21* 24  --  24  GLUCOSE 158*  < > 208* 245* 326* 322* 288*   --  166*  BUN 100*  < > 99* 102* 99* 98* 70*  --  60*  CREATININE 2.82*  < > 3.05* 3.06* 3.49* 3.65* 2.89*  --  2.81*  CALCIUM 8.0*  < > 7.3* 7.0* 6.3* 6.3* 6.2*  --  6.6*  MG 2.3  --  2.4 2.3 2.4  --  2.1  --   --   PHOS  --   < > 3.4 3.5 7.6*  --  4.6 6.6* 6.5*  < > = values in this interval not displayed.  Liver Function Tests:  Recent Labs Lab 01/06/2016 1329 01/16/16 0455 01/19/16 0419  AST 88* 138*  --   ALT 72* 114*  --   ALKPHOS 251* 262*  --   BILITOT 2.7* 3.5*  --   PROT 6.3* 6.1*  --   ALBUMIN 2.2* 2.2* 1.3*   No results for input(s): LIPASE, AMYLASE in the last 168 hours. No results for input(s): AMMONIA in the last 168 hours.  CBC:  Recent Labs Lab 01/20/2016 1329 01/16/16 0455 01/17/16 0450 01/14/2016 0426 01/20/16 0501  WBC 30.8* 25.7* 42.7* 49.3* 25.1*  NEUTROABS 27.8*  --   --   --  23.9*  HGB 14.1 13.4 11.0* 10.9* 9.7*  HCT 41.4 39.2 32.7* 32.3* 29.1*  MCV 84.3  84.8 83.7 84.4 84.3  PLT 99* 101* 87* 145* 66*    Cardiac Enzymes:  Recent Labs Lab 12/29/2015 1329 12/26/2015 2121 01/16/16 0144 01/16/16 0455  TROPONINI 0.17* 0.14* 0.26* 0.11*    BNP: Invalid input(s): POCBNP  CBG:  Recent Labs Lab 01/20/16 0512 01/20/16 0554 01/20/16 0734 01/20/16 0825 01/20/16 0957  GLUCAP 163* 152* 125* 133* 130*    Microbiology: Results for orders placed or performed during the hospital encounter of 01/07/2016  Urine culture     Status: Abnormal   Collection Time: 01/01/2016  1:29 PM  Result Value Ref Range Status   Specimen Description URINE, RANDOM  Final   Special Requests NONE  Final   Culture >=100,000 COLONIES/mL ESCHERICHIA COLI (A)  Final   Report Status 01/17/2016 FINAL  Final   Organism ID, Bacteria ESCHERICHIA COLI (A)  Final      Susceptibility   Escherichia coli - MIC*    AMPICILLIN >=32 RESISTANT Resistant     CEFAZOLIN <=4 SENSITIVE Sensitive     CEFTRIAXONE <=1 SENSITIVE Sensitive     CIPROFLOXACIN <=0.25 SENSITIVE Sensitive      GENTAMICIN <=1 SENSITIVE Sensitive     IMIPENEM <=0.25 SENSITIVE Sensitive     NITROFURANTOIN <=16 SENSITIVE Sensitive     TRIMETH/SULFA <=20 SENSITIVE Sensitive     AMPICILLIN/SULBACTAM >=32 RESISTANT Resistant     PIP/TAZO <=4 SENSITIVE Sensitive     Extended ESBL NEGATIVE Sensitive     * >=100,000 COLONIES/mL ESCHERICHIA COLI  Culture, blood (routine x 2)     Status: Abnormal   Collection Time: 12/27/2015  1:29 PM  Result Value Ref Range Status   Specimen Description BLOOD RIGHT HAND  Final   Special Requests   Final    BOTTLES DRAWN AEROBIC AND ANAEROBIC 11MLAERO,8MLANA   Culture  Setup Time   Final    GRAM NEGATIVE RODS IN BOTH AEROBIC AND ANAEROBIC BOTTLES CRITICAL RESULT CALLED TO, READ BACK BY AND VERIFIED WITH: NATE COOKSON AT 0881 ON 01/16/16 [place]. Performed at [facility] (A)  Final   Report Status 01/19/2016 FINAL  Final   Organism ID, Bacteria ESCHERICHIA COLI  Final      Susceptibility   Escherichia coli - MIC*    AMPICILLIN >=32 RESISTANT Resistant     CEFAZOLIN <=4 SENSITIVE Sensitive     CEFEPIME <=1 SENSITIVE Sensitive     CEFTAZIDIME <=1 SENSITIVE Sensitive     CEFTRIAXONE <=1 SENSITIVE Sensitive     CIPROFLOXACIN <=0.25 SENSITIVE Sensitive     GENTAMICIN <=1 SENSITIVE Sensitive     IMIPENEM <=0.25 SENSITIVE Sensitive     TRIMETH/SULFA <=20 SENSITIVE Sensitive     AMPICILLIN/SULBACTAM >=32 RESISTANT Resistant     PIP/TAZO <=4 SENSITIVE Sensitive     Extended ESBL NEGATIVE Sensitive     * ESCHERICHIA COLI  Culture, blood (routine x 2)     Status: Abnormal   Collection Time: 01/17/2016  1:29 PM  Result Value Ref Range Status   Specimen Description BLOOD LEFT ANTECUBITAL  Final   Special Requests   Final    BOTTLES DRAWN AEROBIC AND ANAEROBIC AER 12ML ANA 11ML   Culture  Setup Time   Final    GRAM NEGATIVE RODS IN BOTH AEROBIC AND ANAEROBIC BOTTLES CRITICAL VALUE NOTED.  VALUE IS CONSISTENT WITH PREVIOUSLY REPORTED AND  CALLED VALUE. GRAM POSITIVE COCCI AEROBIC BOTTLE ONLY CRITICAL RESULT CALLED TO, READ BACK BY AND VERIFIED WITH: Charlane Ferretti @ 1031 01/16/16 by  [place].    Culture (A)  Final    ESCHERICHIA COLI SUSCEPTIBILITIES PERFORMED ON PREVIOUS CULTURE WITHIN THE LAST 5 DAYS. STAPHYLOCOCCUS SPECIES (COAGULASE NEGATIVE) THE SIGNIFICANCE OF ISOLATING THIS ORGANISM FROM A SINGLE SET OF BLOOD CULTURES WHEN MULTIPLE SETS ARE DRAWN IS UNCERTAIN. PLEASE NOTIFY THE MICROBIOLOGY DEPARTMENT WITHIN ONE WEEK IF SPECIATION AND SENSITIVITIES ARE REQUIRED. Performed at [facility]    Report Status 12/27/2015 FINAL  Final  Blood Culture ID Panel (Reflexed)     Status: Abnormal   Collection Time: 01/12/2016  1:29 PM  Result Value Ref Range Status   Enterococcus species NOT DETECTED NOT DETECTED Final   Listeria monocytogenes NOT DETECTED NOT DETECTED Final   Staphylococcus species NOT DETECTED NOT DETECTED Final   Staphylococcus aureus NOT DETECTED NOT DETECTED Final   Streptococcus species NOT DETECTED NOT DETECTED Final   Streptococcus agalactiae NOT DETECTED NOT DETECTED Final   Streptococcus pneumoniae NOT DETECTED NOT DETECTED Final   Streptococcus pyogenes NOT DETECTED NOT DETECTED Final   Acinetobacter baumannii NOT DETECTED NOT DETECTED Final   Enterobacteriaceae species DETECTED (A) NOT DETECTED Final    Comment: CRITICAL RESULT CALLED TO, READ BACK BY AND VERIFIED WITH: NATE COOKSON AT 0556 ON 01/16/16 [place].    Enterobacter cloacae complex NOT DETECTED NOT DETECTED Final   Escherichia coli DETECTED (A) NOT DETECTED Final    Comment: CRITICAL RESULT CALLED TO, READ BACK BY AND VERIFIED WITH: NATE COOKSON AT 3151 ON 01/16/16 [place].    Klebsiella oxytoca NOT DETECTED NOT DETECTED Final   Klebsiella pneumoniae NOT DETECTED NOT DETECTED Final   Proteus species NOT DETECTED NOT DETECTED Final   Serratia marcescens NOT DETECTED NOT DETECTED Final   Carbapenem resistance NOT DETECTED NOT DETECTED Final    Haemophilus influenzae NOT DETECTED NOT DETECTED Final   Neisseria meningitidis NOT DETECTED NOT DETECTED Final   Pseudomonas aeruginosa NOT DETECTED NOT DETECTED Final   Candida albicans NOT DETECTED NOT DETECTED Final   Candida glabrata NOT DETECTED NOT DETECTED Final   Candida krusei NOT DETECTED NOT DETECTED Final   Candida parapsilosis NOT DETECTED NOT DETECTED Final   Candida tropicalis NOT DETECTED NOT DETECTED Final  Blood Culture ID Panel (Reflexed)     Status: Abnormal   Collection Time: 01/03/2016  1:40 PM  Result Value Ref Range Status   Enterococcus species NOT DETECTED NOT DETECTED Final   Vancomycin resistance NOT DETECTED NOT DETECTED Final   Listeria monocytogenes NOT DETECTED NOT DETECTED Final   Staphylococcus species DETECTED (A) NOT DETECTED Final    Comment: CRITICAL RESULT CALLED TO, READ BACK BY AND VERIFIED WITH: Charlane Ferretti @ 7616 01/16/16 by [facility].    Staphylococcus aureus NOT DETECTED NOT DETECTED Final   Methicillin resistance NOT DETECTED NOT DETECTED Final   Streptococcus species NOT DETECTED NOT DETECTED Final   Streptococcus agalactiae NOT DETECTED NOT DETECTED Final   Streptococcus pneumoniae NOT DETECTED NOT DETECTED Final   Streptococcus pyogenes NOT DETECTED NOT DETECTED Final   Acinetobacter baumannii NOT DETECTED NOT DETECTED Final   Enterobacteriaceae species DETECTED (A) NOT DETECTED Final    Comment: CRITICAL RESULT CALLED TO, READ BACK BY AND VERIFIED WITH: Charlane Ferretti @ 0737 01/16/16 by [facility].    Enterobacter cloacae complex NOT DETECTED NOT DETECTED Final   Escherichia coli DETECTED (A) NOT DETECTED Final    Comment: CRITICAL RESULT CALLED TO, READ BACK BY AND VERIFIED WITH: Charlane Ferretti @ 1062 01/16/16 by [facility].    Klebsiella oxytoca NOT DETECTED NOT DETECTED Final  Klebsiella pneumoniae NOT DETECTED NOT DETECTED Final   Proteus species NOT DETECTED NOT DETECTED Final   Serratia marcescens NOT DETECTED NOT DETECTED Final   Carbapenem  resistance NOT DETECTED NOT DETECTED Final   Haemophilus influenzae NOT DETECTED NOT DETECTED Final   Neisseria meningitidis NOT DETECTED NOT DETECTED Final   Pseudomonas aeruginosa NOT DETECTED NOT DETECTED Final   Candida albicans NOT DETECTED NOT DETECTED Final   Candida glabrata NOT DETECTED NOT DETECTED Final   Candida krusei NOT DETECTED NOT DETECTED Final   Candida parapsilosis NOT DETECTED NOT DETECTED Final   Candida tropicalis NOT DETECTED NOT DETECTED Final  MRSA PCR Screening     Status: None   Collection Time: 12/24/2015  7:00 PM  Result Value Ref Range Status   MRSA by PCR NEGATIVE NEGATIVE Final    Comment:        The GeneXpert MRSA Assay (FDA approved for NASAL specimens only), is one component of a comprehensive MRSA colonization surveillance program. It is not intended to diagnose MRSA infection nor to guide or monitor treatment for MRSA infections.     Coagulation Studies: No results for input(s): LABPROT, INR in the last 72 hours.  Urinalysis: No results for input(s): COLORURINE, LABSPEC, PHURINE, GLUCOSEU, HGBUR, BILIRUBINUR, KETONESUR, PROTEINUR, UROBILINOGEN, NITRITE, LEUKOCYTESUR in the last 72 hours.  Invalid input(s): APPERANCEUR    Imaging: Dg Chest Port 1 View  Result Date: 01/20/2016 CLINICAL DATA:  Oxygen desaturation. EXAM: PORTABLE CHEST 1 VIEW COMPARISON:  01/09/2016 FINDINGS: Endotracheal tube tip measures 2.7 cm above the carina. Enteric tube tip is off the field of view but below the left hemidiaphragm. Right central venous catheter tip is over the cavoatrial junction region. No pneumothorax. Shallow inspiration. Increasing perihilar airspace disease in the lungs could indicate edema, pneumonia, or ARDS. Normal heart size and pulmonary vascularity. IMPRESSION: Appliances appear in satisfactory position. Increasing bilateral perihilar airspace disease. Electronically Signed   By: Lucienne Capers M.D.   On: 01/20/2016 01:33     Medications:    . amiodarone 30 mg/hr (01/20/16 1000)  . fentaNYL infusion INTRAVENOUS 100 mcg/hr (01/20/16 1000)  . insulin (NOVOLIN-R) infusion 5.8 mL/hr at 01/20/16 1000  . norepinephrine 2 mcg/min (01/20/16 1000)  . propofol (DIPRIVAN) infusion 5 mcg/kg/min (01/20/16 1000)   . amLODipine  2.5 mg Oral Daily  . aspirin EC  81 mg Oral Daily  . atorvastatin  80 mg Oral QPM  . B-complex with vitamin C  1 tablet Oral Daily  . cefTRIAXone  2 g Intravenous Q24H  . chlorhexidine gluconate (MEDLINE KIT)  15 mL Mouth Rinse BID  . cholecalciferol  1,000 Units Oral Daily  . famotidine (PEPCID) IV  20 mg Intravenous Q48H  . feeding supplement (VITAL HIGH PROTEIN)  1,000 mL Per Tube Q24H  . heparin subcutaneous  5,000 Units Subcutaneous Q8H  . ipratropium-albuterol  3 mL Nebulization Q6H  . mouth rinse  15 mL Mouth Rinse QID  . pantoprazole (PROTONIX) IV  40 mg Intravenous Daily  . potassium chloride SA  20 mEq Oral Daily  . sodium chloride flush  10-40 mL Intracatheter Q12H   acetaminophen, bisacodyl, fentaNYL, ipratropium-albuterol, metoprolol, midazolam, midazolam, ondansetron **OR** ondansetron (ZOFRAN) IV, sennosides, sodium chloride flush, traMADol, vecuronium  Assessment/ Plan:  75 y.o. Caucasian female with medical problems of diabetes, hypertension, atrial fibrillation, was admitted on 01/13/2016 with sepsis from urinary source and bilateral pneumonia.   1. Acute renal failure, likely ATN from concurrent illness 2. Bilateral pneumonia 3. Acute respiratory failure requiring  ventilator support 4. Sepsis. Escherichia coli in blood and urine. Hypotension requiring pressors 5. Hypocalcemia, low albumin noted 6. 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 Creatinine baseline 1.1 from October 2017. GFR 49 7. Acidosis, mixed respiratory and metabolic 8. Left hydronephrosis from 6 mm stone. Urgent stent placement early morning 11/26  Plan:  Unfortunately the patient remains  critically ill despite aggressive medical care including antibiotic therapy, dialysis, and ventilatory support. Given ongoing critical illness we will proceed with continuous renal replacement therapy at this point in time. I've discussed this with pulmonary critical care. We will plan for ultrafiltration target of 50 cc per hour.  Patient continues to have a very guarded prognosis at this time.   LOS: 5 Heidi Villegas 11/28/201710:28 AM

## 2016-01-20 NOTE — Progress Notes (Signed)
Inpatient Diabetes Program Recommendations  AACE/ADA: New Consensus Statement on Inpatient Glycemic Control (2015)  Target Ranges:  Prepandial:   less than 140 mg/dL      Peak postprandial:   less than 180 mg/dL (1-2 hours)      Critically ill patients:  140 - 180 mg/dL   Results for Heidi Villegas, Heidi Villegas (MRN [id number]) as of 01/20/2016 08:35  Ref. Range 01/19/2016 07:35 01/19/2016 11:12 01/19/2016 13:28 01/19/2016 14:35 01/19/2016 15:54 01/19/2016 16:32 01/19/2016 18:00 01/19/2016 18:54 01/19/2016 20:37 01/19/2016 22:04 01/19/2016 22:59  Glucose-Capillary Latest Ref Range: 65 - 99 mg/dL 274 (H) 338 (H) 317 (H) 281 (H) 246 (H) 235 (H) 242 (H) 261 (H) 276 (H) 250 (H) 226 (H)   Results for Heidi Villegas, Heidi Villegas (MRN [id number]) as of 01/20/2016 08:35  Ref. Range 01/20/2016 00:00 01/20/2016 01:01 01/20/2016 02:08 01/20/2016 03:00 01/20/2016 04:02 01/20/2016 05:12 01/20/2016 05:54 01/20/2016 07:34 01/20/2016 08:25  Glucose-Capillary Latest Ref Range: 65 - 99 mg/dL 225 (H) 206 (H) 190 (H) 183 (H) 215 (H) 163 (H) 152 (H) 125 (H) 133 (H)    Admit with: Sepsis  History: DM  Home DM Meds: Metformin 500 mg daily  Current Insulin Orders: IV Insulin drip per ICU Glycemic Control Protocol Phase 2      -Patient started on IV Insulin drip per ICU Glycemic Control Protocol Phase 2 yesterday at 1:30pm.  -CBGs have slowly started to stabilize, however, insulin drip rates remain >4 units/hour.  -Do not recommend transition to SQ insulin until insulin drip rates are less than 4 units/hour and patient has had 6 subsequent CBG readings less than 180 mg/dl.      --Will follow patient during hospitalization--  Wyn Quaker RN, MSN, CDE Diabetes Coordinator Inpatient Glycemic Control Team Team Pager: [phone] (8a-5p)

## 2016-01-20 NOTE — Progress Notes (Addendum)
PULMONARY / CRITICAL CARE MEDICINE   Name: Heidi Villegas MRN: [id number] DOB: 1940-08-02    ADMISSION DATE:  12/30/2015  Discussion 75 y/o caucasian female presenting with urosepsis, septic shock and acute hypoxic respiratory failure necessitating intubation. CT abdomen 11/25 shows obstructing left renal calculi and hydronephrosis. Taken to the OR 11/26 for a cystoscopy with left retrogate pyelogram, ureteral stent and foley placement. Severe hypercarbia post op. Urine output improving  ASSESSMENT / PLAN:  PULMONARY A: Acute hypoxic respiratory failure possibly related to  septic shock-Failed BiPAP and emergently intubated 11/25 due to persistent hypoxemia, tachypnea and tachycardia with worsening mental status-severe hypercarbia post op Mix Metabolic and respiratory  acidosis.  P:   Full vent support; settings adjusted with increased PEEP 15 wean as tolerated Repeat ABG CXR in am Continue Nebulized bronchodilators. VAP Protocol  CARDIOVASCULAR A:  Septic shock secondary to urosepsis-improving Tachycardia-improving Elevated troponin likely demand ischemia secondary to acute respiratory failure and septic shock New onset Afibb with RVR P:  Continuous telemetry Continue amlodipine Continue Amiodarone gtt Cardiology consulted appreciate input Levophed gtt as needed to maintain map >65  RENAL A:   AKI likely secondary to sepsis-improving Left hydronephrosis with obstructing renal calculi on CT S/P STAT cystoscopy with stent placement 01/02/2016  Right renal mass-not apparent on CT abdomen P:   Follow BMET Avoid nephrotoxic drugs Replace electrolytes per ICU protocol Urology consulted appreciate input Nephrology consulted appreciate input May need repeat Hemodialysis session today 11/28 May need CRRT  GASTROINTESTINAL A:   Diarrhea P:   Continue tube feedings Orogastric tube care  Tube feeds as tolerated Protonix for GI prophylaxis Hold bowel regimen for  now  HEMATOLOGIC A:   Anemia P:  Heparin for VTE prophylaxis Trend CBC's Transfuse for hgb <7 Monitor for s/sx of bleeding  INFECTIOUS A:   Urosepsis with septic shock-worsening leukocytosis; blood cultures positive for Escherichia coli, Enterobacteriaceae species, and Staphylococcus species in both aerobic and anaerobic bottles. Final cultures pending. Urine culture positive for Escherichia coli with a colony count greater than 100,000 Leukocytosis with WBC=49 Hypothermia-resolved P:   Trend WBC's and monitor fever curve Trend PCT's Continue Ceftriaxone  Follow cultures  CULTURES: 11/23 BC; GNR; PCR- enterobacter, Ecoli in both aerobic and anaerobic bottles  11/23 UC; Escherichia coli 11/24 Flu negative MRSA PCR 11/23 negative.   ANTIBIOTICS: 11/23 Vancomycin>>11/24 11/23 Zosyn>>11/23 11/25 Unasyn x 1dose 11/24 Meropenem>>11/27 11/27 Ceftriaxone>>  ENDOCRINE A:   Diabetes Melitus P:   Insulin gtt CBG's q1hrs  NEUROLOGIC A:   Acute metabolic encephalopathy likely secondary to sepsis P:   RASS score 0 to -1 Fentanyl and propofol gtt to maintain RASS goal WUA daily Lights on during the day    STUDIES:  11/23 Renal ultrasound>>Mild to moderate left hydronephrosis without obstructing cause identified, right renal mass.  11/25 2-D echo>>EF 40 to 45% 11/25 CT Abd Pelvis>>Bilateral mild perinephric stranding. There is mild to moderate left hydronephrosis. Axial image 43 there is 6 mm calcified obstructive calculus in proximal left ureter at the level of upper endplate of L4 vertebral body. There is a Foley catheter within decompressed urinary bladder. No small bowel obstruction.  Few diverticula are noted descending colon. No evidence of acute colitis or diverticulitis. Status post hysterectomy. Degenerative changes thoracolumbar spine. There is about 4 mm anterolisthesis L5 on S1 vertebral body. Bilateral lower lobe posterior consolidation with air bronchogram highly  suspicious for pneumonia.  SIGNIFICANT EVENTS: 11/23-Pt admitted with severe sepsis secondary to pylonephritis 11/24-Pt iin severe respiratory distress ,  requiring BiPAP 11/25-Pt failed Bipap requiring mechanical intubation  LINES/TUBES: 01/16/16 Right IJ>> 11/25 ETT>> Right femoral dual lumen HD catheter 11/26>>  -----------------------------------------  CONSULTATION DATE:  01/16/16  REFERRING MD: Dr. Estanislado Pandy  CHIEF COMPLAINT:  Respiratory distress  HISTORY OF PRESENT ILLNESS:   Heidi Villegas is a 75 yo female with PMH significant for Arthritis, Hypertension and DM.  Patient [facility] ON 11/23 with severe sepsis, elevated lactic acid and altered mental status. On 11/24 patient was  Was noted to be severe respiratory distress along with tachycardia and hypertension.  PCCM team was consulted for further management.  Review of CXR; elevated right diaphragm, slight pulm edema/atelectasis.  ABG showed metabolic acidosis, mild lactic acidosis with one spurious value of 10>> 2.1  REVIEW OF SYSTEMS: Unable to assess pt intubated   SUBJECTIVE:  Pt currently intubated during the night pt became hypoxic with O2 sats decreasing to 85% requiring an increase in PEEP to 15.   VITAL SIGNS: BP (!) 88/54   Pulse (!) 107   Temp 98.6 F (37 C) (Oral)   Resp (!) 30   Ht 5\' 1"  (1.549 m)   Wt 238 lb 1.6 oz (108 kg)   SpO2 (!) 88%   BMI 44.99 kg/m   HEMODYNAMICS: CVP:  [18 mmHg-24 mmHg] 18 mmHg  VENTILATOR SETTINGS: Vent Mode: PRVC FiO2 (%):  [60 %-100 %] 100 % Set Rate:  [30 bmp] 30 bmp Vt Set:  [400 mL] 400 mL PEEP:  [10 cmH20-15 cmH20] 15 cmH20  INTAKE / OUTPUT: I/O last 3 completed shifts: In: 6329.1 [I.V.:4597.4; Other:120; NG/GT:1511.7; IV Piggyback:100] Out: 800 [Urine:300; Other:500]  PHYSICAL EXAMINATION: General:  Acutely ill Caucasian female, well nourished HEENT:  Atraumatic, normocephalic, no discharge, no JVD appreciated Cardiovascular: irregular  irregular, no M/R/G noted, 2+ generalized edema Lungs: coarse throughout, rrr, even, non labored Abdomen:  Soft, nontender, active bowel sounds, flexiseal in place Musculoskeletal:  No inflammation/deformity noted Skin:  Grossly intact  Update: No family at bedside to update 01/20/2016  I have personally obtained a history, examined the patient, evaluated Pertinent laboratory and RadioGraphic/imaging results, and  formulated the assessment and plan   The Patient requires high complexity decision making for assessment and support, frequent evaluation and titration of therapies, application of advanced monitoring technologies and extensive interpretation of multiple databases. Critical Care Time devoted to patient care services described in this note is 45 minutes.   Overall, patient is critically ill, prognosis is guarded.  Patient with Multiorgan failure and at high risk for cardiac arrest and death.    Corrin Parker, M.D.  [facility]  Medical Director [place] Director [facility] Cardio-Pulmonary Department

## 2016-01-20 NOTE — Progress Notes (Signed)
Pharmacy Antibiotic Note/ Electrolyte Monitoring/ CRRT Medication Adjustment  Heidi Villegas is a 75 y.o. female admitted on 01/17/2016 with sepsis.  Pharmacy has been consulted for ceftriaxone dosing.  Pharmacy also consulted to assist in electrolyte monitoring and to adjust medications based on CRRT. Pt started CRRT 11/28  Plan: 1. Antibiotics Continue ceftriaxone 2 g IV q 24 hours for E.coli bacteremia/UTI Spoke with MD about coagulase negative staph in 1/2 bottles, likely contaminant   2. Electrolytes 11/28 1420 K = 4.4 and corrected Ca = 9.14 Pt has KCl 20 mEq daily ordered. No other replacement needed at this time. Will f/u AM labs and continue to replace per protocol  3. CRRT No medication adjustments needed at this time. Will continue to monitor  Height: 5\' 1"  (154.9 cm) Weight: 242 lb 15.2 oz (110.2 kg) IBW/kg (Calculated) : 47.8  Temp (24hrs), Avg:98.7 F (37.1 C), Min:97.6 F (36.4 C), Max:99.7 F (37.6 C)   Recent Labs Lab 01/01/2016 1329  01/16/2016 2121 01/16/16 0455  01/16/16 2348 01/17/16 0450 01/17/16 0751 01/17/16 1651 12/30/2015 0426 12/30/2015 1215 01/19/16 0419 01/20/16 0501 01/20/16 1100 01/20/16 1420  WBC 30.8*  --   --  25.7*  --   --  42.7*  --   --  49.3*  --   --  25.1*  --   --   CREATININE 3.29*  --   --  2.82*  < > 3.05* 3.06*  --   --  3.49* 3.65* 2.89* 2.81* 3.08* 2.92*  LATICACIDVEN 3.8*  < > 2.1* 2.2*  --  1.7  --  1.6 0.9  --   --   --   --   --   --   < > = values in this interval not displayed.  Estimated Creatinine Clearance: 19.7 mL/min (by C-G formula based on SCr of 2.92 mg/dL (H)).    Allergies  Allergen Reactions  . Codeine Nausea And Vomiting  . Sulfa Antibiotics     Antimicrobials this admission: Piperacillin/tazobactam 11/23 >> 11/24 Vancomycin 11/23 >> 11/24, 11/25 >> 11/26 Meropenem 11/24 >> 11/27 Ceftriaxone 11/27 >>  Microbiology results: 11/23 BCx: E coli 2/2, staph 1/2, S ceftriaxone 11/23 UCx: E coli  resistant to ampicillin and amp/sulbactam 11/23 Sputum: pending  11/23 MRSA PCR: negative  Thank you for allowing pharmacy to be a part of this patient's care.  Darrow Bussing, PharmD Pharmacy Resident 01/20/2016 3:23 PM

## 2016-01-21 LAB — RENAL FUNCTION PANEL
ALBUMIN: 1.2 g/dL — AB (ref 3.5–5.0)
ALBUMIN: 1.2 g/dL — AB (ref 3.5–5.0)
ALBUMIN: 1.2 g/dL — AB (ref 3.5–5.0)
ANION GAP: 9 (ref 5–15)
ANION GAP: 7 (ref 5–15)
ANION GAP: 6 (ref 5–15)
Albumin: 1.1 g/dL — ABNORMAL LOW (ref 3.5–5.0)
Albumin: 1.2 g/dL — ABNORMAL LOW (ref 3.5–5.0)
Albumin: 1.2 g/dL — ABNORMAL LOW (ref 3.5–5.0)
Anion gap: 7 (ref 5–15)
Anion gap: 7 (ref 5–15)
Anion gap: 6 (ref 5–15)
BUN: 47 mg/dL — AB (ref 6–20)
BUN: 40 mg/dL — AB (ref 6–20)
BUN: 39 mg/dL — ABNORMAL HIGH (ref 6–20)
BUN: 33 mg/dL — AB (ref 6–20)
BUN: 32 mg/dL — ABNORMAL HIGH (ref 6–20)
BUN: 32 mg/dL — AB (ref 6–20)
CALCIUM: 7.6 mg/dL — AB (ref 8.9–10.3)
CALCIUM: 7.8 mg/dL — AB (ref 8.9–10.3)
CALCIUM: 7.8 mg/dL — AB (ref 8.9–10.3)
CALCIUM: 8.1 mg/dL — AB (ref 8.9–10.3)
CHLORIDE: 101 mmol/L (ref 101–111)
CHLORIDE: 101 mmol/L (ref 101–111)
CHLORIDE: 103 mmol/L (ref 101–111)
CO2: 26 mmol/L (ref 22–32)
CO2: 27 mmol/L (ref 22–32)
CO2: 27 mmol/L (ref 22–32)
CO2: 27 mmol/L (ref 22–32)
CO2: 26 mmol/L (ref 22–32)
CO2: 27 mmol/L (ref 22–32)
CREATININE: 1.47 mg/dL — AB (ref 0.44–1.00)
CREATININE: 1.12 mg/dL — AB (ref 0.44–1.00)
CREATININE: 1.1 mg/dL — AB (ref 0.44–1.00)
Calcium: 7.8 mg/dL — ABNORMAL LOW (ref 8.9–10.3)
Calcium: 8.1 mg/dL — ABNORMAL LOW (ref 8.9–10.3)
Chloride: 99 mmol/L — ABNORMAL LOW (ref 101–111)
Chloride: 105 mmol/L (ref 101–111)
Chloride: 103 mmol/L (ref 101–111)
Creatinine, Ser: 1.85 mg/dL — ABNORMAL HIGH (ref 0.44–1.00)
Creatinine, Ser: 1.73 mg/dL — ABNORMAL HIGH (ref 0.44–1.00)
Creatinine, Ser: 1.31 mg/dL — ABNORMAL HIGH (ref 0.44–1.00)
GFR calc Af Amer: 30 mL/min — ABNORMAL LOW (ref >60)
GFR calc Af Amer: 40 mL/min — ABNORMAL LOW (ref >60)
GFR calc Af Amer: 46 mL/min — ABNORMAL LOW (ref >60)
GFR calc Af Amer: 56 mL/min — ABNORMAL LOW (ref >60)
GFR calc non Af Amer: 26 mL/min — ABNORMAL LOW (ref >60)
GFR calc non Af Amer: 34 mL/min — ABNORMAL LOW (ref >60)
GFR, EST AFRICAN AMERICAN: 33 mL/min — AB (ref >60)
GFR, EST AFRICAN AMERICAN: 55 mL/min — AB (ref >60)
GFR, EST NON AFRICAN AMERICAN: 28 mL/min — AB (ref >60)
GFR, EST NON AFRICAN AMERICAN: 39 mL/min — AB (ref >60)
GFR, EST NON AFRICAN AMERICAN: 48 mL/min — AB (ref >60)
GFR, EST NON AFRICAN AMERICAN: 49 mL/min — AB (ref >60)
GLUCOSE: 215 mg/dL — AB (ref 65–99)
GLUCOSE: 151 mg/dL — AB (ref 65–99)
GLUCOSE: 128 mg/dL — AB (ref 65–99)
Glucose, Bld: 179 mg/dL — ABNORMAL HIGH (ref 65–99)
Glucose, Bld: 124 mg/dL — ABNORMAL HIGH (ref 65–99)
Glucose, Bld: 126 mg/dL — ABNORMAL HIGH (ref 65–99)
PHOSPHORUS: 2.4 mg/dL — AB (ref 2.5–4.6)
PHOSPHORUS: 2.4 mg/dL — AB (ref 2.5–4.6)
PHOSPHORUS: 2.2 mg/dL — AB (ref 2.5–4.6)
POTASSIUM: 4.3 mmol/L (ref 3.5–5.1)
POTASSIUM: 4.5 mmol/L (ref 3.5–5.1)
Phosphorus: 3.8 mg/dL (ref 2.5–4.6)
Phosphorus: 3.4 mg/dL (ref 2.5–4.6)
Phosphorus: 2.9 mg/dL (ref 2.5–4.6)
Potassium: 4.4 mmol/L (ref 3.5–5.1)
Potassium: 4.3 mmol/L (ref 3.5–5.1)
Potassium: 4.3 mmol/L (ref 3.5–5.1)
Potassium: 4.4 mmol/L (ref 3.5–5.1)
SODIUM: 134 mmol/L — AB (ref 135–145)
SODIUM: 135 mmol/L (ref 135–145)
SODIUM: 137 mmol/L (ref 135–145)
SODIUM: 136 mmol/L (ref 135–145)
Sodium: 135 mmol/L (ref 135–145)
Sodium: 137 mmol/L (ref 135–145)

## 2016-01-21 LAB — CBC
HCT: 26.2 % — ABNORMAL LOW (ref 35.0–47.0)
HEMOGLOBIN: 9 g/dL — AB (ref 12.0–16.0)
MCH: 28.6 pg (ref 26.0–34.0)
MCHC: 34.3 g/dL (ref 32.0–36.0)
MCV: 83.5 fL (ref 80.0–100.0)
PLATELETS: 96 10*3/uL — AB (ref 150–440)
RBC: 3.14 MIL/uL — ABNORMAL LOW (ref 3.80–5.20)
RDW: 16 % — AB (ref 11.5–14.5)
WBC: 24.6 10*3/uL — ABNORMAL HIGH (ref 3.6–11.0)

## 2016-01-21 LAB — GLUCOSE, CAPILLARY
GLUCOSE-CAPILLARY: 188 mg/dL — AB (ref 65–99)
GLUCOSE-CAPILLARY: 145 mg/dL — AB (ref 65–99)
GLUCOSE-CAPILLARY: 122 mg/dL — AB (ref 65–99)
Glucose-Capillary: 114 mg/dL — ABNORMAL HIGH (ref 65–99)
Glucose-Capillary: 118 mg/dL — ABNORMAL HIGH (ref 65–99)

## 2016-01-21 NOTE — Progress Notes (Signed)
[facility]  ROUNDING NOTE   Subjective:  Patient appears to be a bit better clinically today. She is arousable and follows commands. However urine output remains quite low at 100 cc over the preceding 24 hours. She has tolerated continuous renal placement therapy well.   Objective:  Vital signs in last 24 hours:  Temp:  [97.5 F (36.4 C)-98.9 F (37.2 C)] 97.5 F (36.4 C) (11/29 0800) Pulse Rate:  [100-131] 106 (11/29 0900) Resp:  [20-31] 20 (11/29 0900) BP: (73-121)/(48-98) 109/66 (11/29 0800) SpO2:  [90 %-100 %] 94 % (11/29 0900) FiO2 (%):  [40 %-100 %] 40 % (11/29 0809) Weight:  [106.3 kg (234 lb 5.6 oz)] 106.3 kg (234 lb 5.6 oz) (11/29 0500)  Weight change: -2.2 kg (-4 lb 13.6 oz) Filed Weights   01/19/16 1647 01/20/16 0447 01/21/16 0500  Weight: 108 kg (238 lb 1.6 oz) 110.2 kg (242 lb 15.2 oz) 106.3 kg (234 lb 5.6 oz)    Intake/Output: I/O last 3 completed shifts: In: 4096.9 [I.V.:1866.9; Other:110; NG/GT:1920; IV Piggyback:200] Out: 1230 [Urine:245; Other:910; Stool:75]   Intake/Output this shift:  Total I/O In: 182.6 [I.V.:62.6; NG/GT:120] Out: 117 [Urine:20; Other:97]  Physical Exam: General: Critically ill appearing   Head: Endotracheal tube and OG in place.   Eyes: Eyes closed   Neck: Supple, trachea midline  Lungs:  Scattered rhonchi bilateral, vent assisted   Heart: S1S2 no rubs  Abdomen:  Distended, bowel sounds present   Extremities: 1+ peripheral edema.  Neurologic: Arousable, will follow commands   Skin: No lesions  Access: Right femoral temporary dialysis catheter     Basic Metabolic Panel:  Recent Labs Lab 01/16/16 0455  01/16/16 2348 01/17/16 0450 12/26/2015 0426  01/19/16 0419  01/20/16 1420 01/20/16 1846 01/20/16 2229 01/21/16 0230 01/21/16 0630  NA 132*  < > 134* 132* 127*  < > 129*  < > 130* 132* 134* 134* 135  K 3.2*  < > 3.3* 3.5 4.2  < > 3.4*  < > 4.4 4.3 4.4 4.4 4.3  CL 101  < > 100* 98* 91*  < > 94*  < > 93*  96* 99* 99* 101  CO2 17*  < > 20* 18* 23  < > 24  < > 24 25 25 26 27   GLUCOSE 158*  < > 208* 245* 326*  < > 288*  < > 149* 180* 214* 215* 179*  BUN 100*  < > 99* 102* 99*  < > 70*  < > 67* 57* 52* 47* 40*  CREATININE 2.82*  < > 3.05* 3.06* 3.49*  < > 2.89*  < > 2.92* 2.38* 2.14* 1.85* 1.73*  CALCIUM 8.0*  < > 7.3* 7.0* 6.3*  < > 6.2*  < > 6.9* 7.2* 7.4* 7.6* 7.8*  MG 2.3  --  2.4 2.3 2.4  --  2.1  --   --   --   --   --   --   PHOS  --   < > 3.4 3.5 7.6*  --  4.6  < > 5.7* 4.7* 4.4 3.8 3.4  < > = values in this interval not displayed.  Liver Function Tests:  Recent Labs Lab 01/05/2016 1329 01/16/16 0455  01/20/16 1420 01/20/16 1846 01/20/16 2229 01/21/16 0230 01/21/16 0630  AST 88* 138*  --   --   --   --   --   --   ALT 72* 114*  --   --   --   --   --   --  ALKPHOS 251* 262*  --   --   --   --   --   --   BILITOT 2.7* 3.5*  --   --   --   --   --   --   PROT 6.3* 6.1*  --   --   --   --   --   --   ALBUMIN 2.2* 2.2*  < > 1.2* 1.1* 1.3* 1.2* 1.1*  < > = values in this interval not displayed. No results for input(s): LIPASE, AMYLASE in the last 168 hours. No results for input(s): AMMONIA in the last 168 hours.  CBC:  Recent Labs Lab 12/24/2015 1329 01/16/16 0455 01/17/16 0450 12/25/2015 0426 01/20/16 0501 01/21/16 0630  WBC 30.8* 25.7* 42.7* 49.3* 25.1* 24.6*  NEUTROABS 27.8*  --   --   --  23.9*  --   HGB 14.1 13.4 11.0* 10.9* 9.7* 9.0*  HCT 41.4 39.2 32.7* 32.3* 29.1* 26.2*  MCV 84.3 84.8 83.7 84.4 84.3 83.5  PLT 99* 101* 87* 145* 66* 96*    Cardiac Enzymes:  Recent Labs Lab 01/12/2016 1329 01/11/2016 2121 01/16/16 0144 01/16/16 0455  TROPONINI 0.17* 0.14* 0.26* 0.11*    BNP: Invalid input(s): POCBNP  CBG:  Recent Labs Lab 01/20/16 1822 01/20/16 2002 01/20/16 2331 01/21/16 0355 01/21/16 0753  GLUCAP 142* 174* 176* 188* 145*    Microbiology: Results for orders placed or performed during the hospital encounter of 01/05/2016  Urine culture     Status:  Abnormal   Collection Time: 01/12/2016  1:29 PM  Result Value Ref Range Status   Specimen Description URINE, RANDOM  Final   Special Requests NONE  Final   Culture >=100,000 COLONIES/mL ESCHERICHIA COLI (A)  Final   Report Status 01/17/2016 FINAL  Final   Organism ID, Bacteria ESCHERICHIA COLI (A)  Final      Susceptibility   Escherichia coli - MIC*    AMPICILLIN >=32 RESISTANT Resistant     CEFAZOLIN <=4 SENSITIVE Sensitive     CEFTRIAXONE <=1 SENSITIVE Sensitive     CIPROFLOXACIN <=0.25 SENSITIVE Sensitive     GENTAMICIN <=1 SENSITIVE Sensitive     IMIPENEM <=0.25 SENSITIVE Sensitive     NITROFURANTOIN <=16 SENSITIVE Sensitive     TRIMETH/SULFA <=20 SENSITIVE Sensitive     AMPICILLIN/SULBACTAM >=32 RESISTANT Resistant     PIP/TAZO <=4 SENSITIVE Sensitive     Extended ESBL NEGATIVE Sensitive     * >=100,000 COLONIES/mL ESCHERICHIA COLI  Culture, blood (routine x 2)     Status: Abnormal   Collection Time: 01/10/2016  1:29 PM  Result Value Ref Range Status   Specimen Description BLOOD RIGHT HAND  Final   Special Requests   Final    BOTTLES DRAWN AEROBIC AND ANAEROBIC 11MLAERO,8MLANA   Culture  Setup Time   Final    GRAM NEGATIVE RODS IN BOTH AEROBIC AND ANAEROBIC BOTTLES CRITICAL RESULT CALLED TO, READ BACK BY AND VERIFIED WITH: NATE COOKSON AT 0923 ON 01/16/16 [place]. Performed at [place] (A)  Final   Report Status 01/19/2016 FINAL  Final   Organism ID, Bacteria ESCHERICHIA COLI  Final      Susceptibility   Escherichia coli - MIC*    AMPICILLIN >=32 RESISTANT Resistant     CEFAZOLIN <=4 SENSITIVE Sensitive     CEFEPIME <=1 SENSITIVE Sensitive     CEFTAZIDIME <=1 SENSITIVE Sensitive     CEFTRIAXONE <=1 SENSITIVE Sensitive  CIPROFLOXACIN <=0.25 SENSITIVE Sensitive     GENTAMICIN <=1 SENSITIVE Sensitive     IMIPENEM <=0.25 SENSITIVE Sensitive     TRIMETH/SULFA <=20 SENSITIVE Sensitive     AMPICILLIN/SULBACTAM >=32 RESISTANT Resistant      PIP/TAZO <=4 SENSITIVE Sensitive     Extended ESBL NEGATIVE Sensitive     * ESCHERICHIA COLI  Culture, blood (routine x 2)     Status: Abnormal   Collection Time: 01/14/2016  1:29 PM  Result Value Ref Range Status   Specimen Description BLOOD LEFT ANTECUBITAL  Final   Special Requests   Final    BOTTLES DRAWN AEROBIC AND ANAEROBIC AER 12ML ANA 11ML   Culture  Setup Time   Final    GRAM NEGATIVE RODS IN BOTH AEROBIC AND ANAEROBIC BOTTLES CRITICAL VALUE NOTED.  VALUE IS CONSISTENT WITH PREVIOUSLY REPORTED AND CALLED VALUE. GRAM POSITIVE COCCI AEROBIC BOTTLE ONLY CRITICAL RESULT CALLED TO, READ BACK BY AND VERIFIED WITH: Charlane Ferretti @ 3664 01/16/16 by [facility].    Culture (A)  Final    ESCHERICHIA COLI SUSCEPTIBILITIES PERFORMED ON PREVIOUS CULTURE WITHIN THE LAST 5 DAYS. STAPHYLOCOCCUS SPECIES (COAGULASE NEGATIVE) THE SIGNIFICANCE OF ISOLATING THIS ORGANISM FROM A SINGLE SET OF BLOOD CULTURES WHEN MULTIPLE SETS ARE DRAWN IS UNCERTAIN. PLEASE NOTIFY THE MICROBIOLOGY DEPARTMENT WITHIN ONE WEEK IF SPECIATION AND SENSITIVITIES ARE REQUIRED. Performed at [facility]    Report Status 01/03/2016 FINAL  Final  Blood Culture ID Panel (Reflexed)     Status: Abnormal   Collection Time: 01/14/2016  1:29 PM  Result Value Ref Range Status   Enterococcus species NOT DETECTED NOT DETECTED Final   Listeria monocytogenes NOT DETECTED NOT DETECTED Final   Staphylococcus species NOT DETECTED NOT DETECTED Final   Staphylococcus aureus NOT DETECTED NOT DETECTED Final   Streptococcus species NOT DETECTED NOT DETECTED Final   Streptococcus agalactiae NOT DETECTED NOT DETECTED Final   Streptococcus pneumoniae NOT DETECTED NOT DETECTED Final   Streptococcus pyogenes NOT DETECTED NOT DETECTED Final   Acinetobacter baumannii NOT DETECTED NOT DETECTED Final   Enterobacteriaceae species DETECTED (A) NOT DETECTED Final    Comment: CRITICAL RESULT CALLED TO, READ BACK BY AND VERIFIED WITH: NATE COOKSON AT  0556 ON 01/16/16 [place].    Enterobacter cloacae complex NOT DETECTED NOT DETECTED Final   Escherichia coli DETECTED (A) NOT DETECTED Final    Comment: CRITICAL RESULT CALLED TO, READ BACK BY AND VERIFIED WITH: NATE COOKSON AT 4034 ON 01/16/16 [place].    Klebsiella oxytoca NOT DETECTED NOT DETECTED Final   Klebsiella pneumoniae NOT DETECTED NOT DETECTED Final   Proteus species NOT DETECTED NOT DETECTED Final   Serratia marcescens NOT DETECTED NOT DETECTED Final   Carbapenem resistance NOT DETECTED NOT DETECTED Final   Haemophilus influenzae NOT DETECTED NOT DETECTED Final   Neisseria meningitidis NOT DETECTED NOT DETECTED Final   Pseudomonas aeruginosa NOT DETECTED NOT DETECTED Final   Candida albicans NOT DETECTED NOT DETECTED Final   Candida glabrata NOT DETECTED NOT DETECTED Final   Candida krusei NOT DETECTED NOT DETECTED Final   Candida parapsilosis NOT DETECTED NOT DETECTED Final   Candida tropicalis NOT DETECTED NOT DETECTED Final  Blood Culture ID Panel (Reflexed)     Status: Abnormal   Collection Time: 01/22/2016  1:40 PM  Result Value Ref Range Status   Enterococcus species NOT DETECTED NOT DETECTED Final   Vancomycin resistance NOT DETECTED NOT DETECTED Final   Listeria monocytogenes NOT DETECTED NOT DETECTED Final   Staphylococcus species DETECTED (  A) NOT DETECTED Final    Comment: CRITICAL RESULT CALLED TO, READ BACK BY AND VERIFIED WITH: Charlane Ferretti @ 6962 01/16/16 by [facility].    Staphylococcus aureus NOT DETECTED NOT DETECTED Final   Methicillin resistance NOT DETECTED NOT DETECTED Final   Streptococcus species NOT DETECTED NOT DETECTED Final   Streptococcus agalactiae NOT DETECTED NOT DETECTED Final   Streptococcus pneumoniae NOT DETECTED NOT DETECTED Final   Streptococcus pyogenes NOT DETECTED NOT DETECTED Final   Acinetobacter baumannii NOT DETECTED NOT DETECTED Final   Enterobacteriaceae species DETECTED (A) NOT DETECTED Final    Comment: CRITICAL RESULT CALLED TO, READ  BACK BY AND VERIFIED WITH: Charlane Ferretti @ 9528 01/16/16 by [facility].    Enterobacter cloacae complex NOT DETECTED NOT DETECTED Final   Escherichia coli DETECTED (A) NOT DETECTED Final    Comment: CRITICAL RESULT CALLED TO, READ BACK BY AND VERIFIED WITH: Charlane Ferretti @ 4132 01/16/16 by [facility].    Klebsiella oxytoca NOT DETECTED NOT DETECTED Final   Klebsiella pneumoniae NOT DETECTED NOT DETECTED Final   Proteus species NOT DETECTED NOT DETECTED Final   Serratia marcescens NOT DETECTED NOT DETECTED Final   Carbapenem resistance NOT DETECTED NOT DETECTED Final   Haemophilus influenzae NOT DETECTED NOT DETECTED Final   Neisseria meningitidis NOT DETECTED NOT DETECTED Final   Pseudomonas aeruginosa NOT DETECTED NOT DETECTED Final   Candida albicans NOT DETECTED NOT DETECTED Final   Candida glabrata NOT DETECTED NOT DETECTED Final   Candida krusei NOT DETECTED NOT DETECTED Final   Candida parapsilosis NOT DETECTED NOT DETECTED Final   Candida tropicalis NOT DETECTED NOT DETECTED Final  MRSA PCR Screening     Status: None   Collection Time: 01/16/2016  7:00 PM  Result Value Ref Range Status   MRSA by PCR NEGATIVE NEGATIVE Final    Comment:        The GeneXpert MRSA Assay (FDA approved for NASAL specimens only), is one component of a comprehensive MRSA colonization surveillance program. It is not intended to diagnose MRSA infection nor to guide or monitor treatment for MRSA infections.     Coagulation Studies: No results for input(s): LABPROT, INR in the last 72 hours.  Urinalysis: No results for input(s): COLORURINE, LABSPEC, PHURINE, GLUCOSEU, HGBUR, BILIRUBINUR, KETONESUR, PROTEINUR, UROBILINOGEN, NITRITE, LEUKOCYTESUR in the last 72 hours.  Invalid input(s): APPERANCEUR    Imaging: Dg Chest Port 1 View  Result Date: 01/20/2016 CLINICAL DATA:  Oxygen desaturation. EXAM: PORTABLE CHEST 1 VIEW COMPARISON:  01/12/2016 FINDINGS: Endotracheal tube tip measures 2.7 cm above the carina.  Enteric tube tip is off the field of view but below the left hemidiaphragm. Right central venous catheter tip is over the cavoatrial junction region. No pneumothorax. Shallow inspiration. Increasing perihilar airspace disease in the lungs could indicate edema, pneumonia, or ARDS. Normal heart size and pulmonary vascularity. IMPRESSION: Appliances appear in satisfactory position. Increasing bilateral perihilar airspace disease. Electronically Signed   By: Lucienne Capers M.D.   On: 01/20/2016 01:33     Medications:   . amiodarone 30 mg/hr (01/21/16 0800)  . dextrose    . fentaNYL infusion INTRAVENOUS 50 mcg/hr (01/21/16 0800)  . norepinephrine (LEVOPHED) Adult infusion 4 mcg/min (01/21/16 0820)  . propofol (DIPRIVAN) infusion Stopped (01/21/16 0400)  . pureflow 3 each (01/21/16 0630)   . aspirin  81 mg Oral Daily  . atorvastatin  80 mg Oral QPM  . B-complex with vitamin C  1 tablet Oral Daily  . cefTRIAXone  2 g Intravenous Q24H  .  chlorhexidine gluconate (MEDLINE KIT)  15 mL Mouth Rinse BID  . cholecalciferol  1,000 Units Oral Daily  . famotidine (PEPCID) IV  20 mg Intravenous Q48H  . feeding supplement (VITAL HIGH PROTEIN)  1,000 mL Per Tube Q24H  . heparin subcutaneous  5,000 Units Subcutaneous Q8H  . insulin aspart  2-6 Units Subcutaneous Q4H  . insulin aspart  6 Units Subcutaneous Q4H  . insulin glargine  35 Units Subcutaneous Q24H  . ipratropium-albuterol  3 mL Nebulization Q6H  . mouth rinse  15 mL Mouth Rinse QID  . potassium chloride  20 mEq Oral Daily  . sodium chloride flush  10-40 mL Intracatheter Q12H   acetaminophen, bisacodyl, dextrose, fentaNYL, heparin, ipratropium-albuterol, metoprolol, midazolam, midazolam, ondansetron **OR** ondansetron (ZOFRAN) IV, sennosides, sodium chloride flush, traMADol, vecuronium  Assessment/ Plan:  75 y.o. Caucasian female with medical problems of diabetes, hypertension, atrial fibrillation, was admitted on 12/25/2015 with sepsis from  urinary source and bilateral pneumonia.   1. Acute renal failure, likely ATN from concurrent illness 2. Bilateral pneumonia 3. Acute respiratory failure requiring ventilator support 4. Sepsis. Escherichia coli in blood and urine. Hypotension requiring pressors 5. Hypocalcemia, low albumin noted 6. 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 Creatinine baseline 1.1 from October 2017. GFR 49 7. Acidosis, mixed respiratory and metabolic 8. Left hydronephrosis from 6 mm stone. Urgent stent placement early morning 11/26  Plan:  Patient has done better with transition to continuous renal replacement therapy. Overall the patient remains net positive. Therefore we will increase ultra filtration target to 100 cc per hour. Otherwise we will continue continuous renal placement therapy as ordered. Hopefully she will be able to be weaned from the ventilator over the next several days. Case discussed with pulmonary critical care. We will continue to monitor patient's progress closely.   LOS: 6 Lille Karim 11/29/20179:45 AM

## 2016-01-21 NOTE — Progress Notes (Signed)
Pharmacy Antibiotic Note/ Electrolyte Monitoring/ CRRT Medication Adjustment  Heidi Villegas is a 75 y.o. female admitted on 01/16/2016 with sepsis.  Pharmacy has been consulted for ceftriaxone dosing.  Pharmacy also consulted to assist in electrolyte monitoring and to adjust medications based on CRRT. Pt started CRRT 11/28 and appears to be doing better today (11/29)  Plan: 1. Antibiotics Continue ceftriaxone 2 g IV q 24 hours for E.coli bacteremia. Will f/u for stop date. Currently day 6 of abx therapy.  2. Electrolytes 11/29 WNL Will f/u AM labs and continue to replace per protocol  3. CRRT No medication adjustments needed at this time. Will continue to monitor  Height: 5\' 1"  (154.9 cm) Weight: 234 lb 5.6 oz (106.3 kg) IBW/kg (Calculated) : 47.8  Temp (24hrs), Avg:98 F (36.7 C), Min:97.5 F (36.4 C), Max:98.7 F (37.1 C)   Recent Labs Lab 01/20/2016 2121 01/16/16 0455  01/16/16 2348 01/17/16 0450 01/17/16 0751 01/17/16 1651 01/18/16 0426  01/20/16 0501  01/20/16 1846 01/20/16 2229 01/21/16 0230 01/21/16 0630 01/21/16 0950  WBC  --  25.7*  --   --  42.7*  --   --  49.3*  --  25.1*  --   --   --   --  24.6*  --   CREATININE  --  2.82*  < > 3.05* 3.06*  --   --  3.49*  < > 2.81*  < > 2.38* 2.14* 1.85* 1.73* 1.47*  LATICACIDVEN 2.1* 2.2*  --  1.7  --  1.6 0.9  --   --   --   --   --   --   --   --   --   < > = values in this interval not displayed.  Estimated Creatinine Clearance: 38.3 mL/min (by C-G formula based on SCr of 1.47 mg/dL (H)).    Allergies  Allergen Reactions  . Codeine Nausea And Vomiting  . Sulfa Antibiotics     Antimicrobials this admission: Piperacillin/tazobactam 11/23 >> 11/24 Vancomycin 11/23 >> 11/24, 11/25 >> 11/26 Meropenem 11/24 >> 11/27 Ceftriaxone 11/27 >>  Microbiology results: 11/23 BCx: E coli 2/2, staph 1/2, S ceftriaxone 11/23 UCx: E coli resistant to ampicillin and amp/sulbactam 11/23 Sputum: pending  11/23 MRSA PCR:  negative  Thank you for allowing pharmacy to be a part of this patient's care.  Darrow Bussing, PharmD Pharmacy Resident 01/21/2016 2:44 PM

## 2016-01-21 NOTE — Progress Notes (Signed)
PULMONARY / CRITICAL CARE MEDICINE   Name: Heidi Villegas MRN: [id number] DOB: 09-21-1940    ADMISSION DATE:  01/13/2016    CONSULTATION DATE:  01/16/16  REFERRING MD: Dr. Estanislado Pandy  CHIEF COMPLAINT:  Respiratory distress  HISTORY OF PRESENT ILLNESS:   Heidi Villegas is a 75 yo female with PMH significant for Arthritis, Hypertension and DM.  Patient presented to [facility] ON 11/23 with severe sepsis, elevated lactic acid and altered mental status. On 11/24 patient was  Was noted to be severe respiratory distress along with tachycardia and hypertension.  PCCM team was consulted for further management.  Discussion 75 y/o caucasian female presenting with urosepsis, septic shock and acute hypoxic respiratory failure necessitating intubation. CT abdomen 11/25 shows obstructing left renal calculi and hydronephrosis. Taken to the OR 11/26 for a cystoscopy with left retrogate pyelogram, ureteral stent and foley placement. Severe hypercarbia post op.     REVIEW OF SYSTEMS: Unable to assess pt intubated   SUBJECTIVE:  Patient continues to be on CRRT, on vasopressors On high fio2 and PEEP Remains critically ill  VITAL SIGNS: BP 100/60   Pulse (!) 107   Temp 97.6 F (36.4 C) (Oral)   Resp (!) 30   Ht 5\' 1"  (1.549 m)   Wt 110.2 kg (242 lb 15.2 oz)   SpO2 100%   BMI 45.90 kg/m   HEMODYNAMICS: CVP:  [6 mmHg] 6 mmHg  VENTILATOR SETTINGS: Vent Mode: PRVC FiO2 (%):  [80 %-100 %] 80 % Set Rate:  [30 bmp] 30 bmp Vt Set:  [500 mL] 500 mL PEEP:  [15 cmH20] 15 cmH20  INTAKE / OUTPUT: I/O last 3 completed shifts: In: 4642.4 [I.V.:2662.4; Other:120; NG/GT:1860] Out: [id number] [Urine:355; BA:[id number]; Stool:75]  PHYSICAL EXAMINATION: General:  Acutely ill Caucasian female, well nourished HEENT:  Atraumatic, normocephalic, no discharge, no JVD appreciated Cardiovascular: irregular irregular, no M/R/G noted, 2+ generalized edema Lungs: coarse throughout, RRR even, non labored Abdomen:  Soft,  nontender, active bowel sounds, flexiseal in place Musculoskeletal:  No inflammation/deformity noted Skin:  Grossly intact GCS<8T   STUDIES:  11/23 Renal ultrasound>>Mild to moderate left hydronephrosis without obstructing cause identified, right renal mass.  11/25 2-D echo>>EF 40 to 45% 11/25 CT Abd Pelvis>>Bilateral mild perinephric stranding. There is mild to moderate left hydronephrosis. Axial image 43 there is 6 mm calcified obstructive calculus in proximal left ureter at the level of upper endplate of L4 vertebral body. There is a Foley catheter within decompressed urinary bladder. No small bowel obstruction.  Few diverticula are noted descending colon. No evidence of acute colitis or diverticulitis. Status post hysterectomy. Degenerative changes thoracolumbar spine. There is about 4 mm anterolisthesis L5 on S1 vertebral body. Bilateral lower lobe posterior consolidation with air bronchogram highly suspicious for pneumonia.  SIGNIFICANT EVENTS: 11/23-Pt admitted with severe sepsis secondary to pylonephritis 11/24-Pt iin severe respiratory distress , requiring BiPAP 11/25-Pt failed Bipap requiring mechanical intubation 11/26- Dialysis per nephro LINES/TUBES: 01/16/16 Right IJ>> 11/25 ETT>> Right femoral dual lumen HD catheter 11/26>>    ASSESSMENT / PLAN:  75 y/o caucasian female presenting with urosepsis, septic shock and acute hypoxic respiratory failure necessitating intubation. CT abdomen 11/25 shows obstructing left renal calculi and hydronephrosis. Taken to the OR 11/26 for a cystoscopy with left retrogate pyelogram, ureteral stent and foley placement. Severe hypercarbia post op.   Patient with multiorgan failure, critically ill  PULMONARY A: Acute hypoxic respiratory failure possibly related to  septic shock-Failed BiPAP and emergently intubated 11/25 due to persistent hypoxemia, tachypnea and  tachycardia with worsening mental status-severe hypercarbia post op Mix  Metabolic and respiratory  acidosis.  P:   Full vent support; settings adjusted with increased PEEP 15 wean as tolerated Repeat ABG Routine CXR Continue Nebulized bronchodilators. VAP Protocol  CARDIOVASCULAR A:  Septic shock secondary to urosepsis-improving Tachycardia-improving Elevated troponin likely demand ischemia secondary to acute respiratory failure and septic shock New onset Afib with RVR P:  Continuous telemetry Continue amlodipine Continue Amiodarone gtt Cardiology consulted appreciate input Levophed gtt as needed to maintain map >65  RENAL A:   AKI likely secondary to sepsis-improving Left hydronephrosis with obstructing renal calculi on CT S/P STAT cystoscopy with stent placement 01/11/2016  Right renal mass-not apparent on CT abdomen P:   Follow BMET Avoid nephrotoxic drugs Replace electrolytes per ICU protocol Urology consulted appreciate input Nephrology consulted appreciate input On CRRT   GASTROINTESTINAL A:   Diarrhea P:   Continue tube feedings Orogastric tube care  Tube feeds as tolerated Protonix for GI prophylaxis Hold bowel regimen for now  HEMATOLOGIC A:   Anemia P:  Heparin for VTE prophylaxis Trend CBC's Transfuse for hgb <7 Monitor for s/sx of bleeding  INFECTIOUS A:   Urosepsis with septic shock-worsening leukocytosis; blood cultures positive for Escherichia coli, Enterobacteriaceae species, and Staphylococcus species in both aerobic and anaerobic bottles. Final cultures pending. Urine culture positive for Escherichia coli with a colony count greater than 100,000 Leukocytosis with WBC=49 Hypothermia-resolved P:   Trend WBC's and monitor fever curve Trend PCT's Continue Ceftriaxone  Follow cultures  CULTURES: 11/23 BC; GNR; PCR- enterobacter, Ecoli in both aerobic and anaerobic bottles  11/23 UC; Escherichia coli 11/24 Flu negative MRSA PCR 11/23 negative.   ANTIBIOTICS: 11/23 Vancomycin>>11/24 11/23  Zosyn>>11/23 11/25 Unasyn x 1dose 11/24 Meropenem>>11/27 11/27 Ceftriaxone>>  ENDOCRINE A:   Diabetes Melitus P:   Insulin gtt CBG's q1hrs  NEUROLOGIC A:   Acute metabolic encephalopathy likely secondary to sepsis P:   RASS score 0 to -1 Fentanyl and propofol gtt to maintain RASS goal WUA daily Lights on during the day    I have personally obtained a history, examined the patient, evaluated Pertinent laboratory and RadioGraphic/imaging results, and  formulated the assessment and plan   The Patient requires high complexity decision making for assessment and support, frequent evaluation and titration of therapies, application of advanced monitoring technologies and extensive interpretation of multiple databases. Critical Care Time devoted to patient care services described in this note is 35 minutes.   Overall, patient is critically ill, prognosis is guarded.  Patient with Multiorgan failure and at high risk for cardiac arrest and death.    Corrin Parker, M.D.  [facility]  Medical Director [place] Director [facility] Cardio-Pulmonary Department

## 2016-01-22 ENCOUNTER — Inpatient Hospital Stay: Payer: PPO

## 2016-01-22 DIAGNOSIS — N171 Acute kidney failure with acute cortical necrosis: Secondary | ICD-10-CM

## 2016-01-22 LAB — RENAL FUNCTION PANEL
ALBUMIN: 1.1 g/dL — AB (ref 3.5–5.0)
ANION GAP: 5 (ref 5–15)
ANION GAP: 6 (ref 5–15)
Albumin: 1.2 g/dL — ABNORMAL LOW (ref 3.5–5.0)
Albumin: 1.1 g/dL — ABNORMAL LOW (ref 3.5–5.0)
Albumin: 1.2 g/dL — ABNORMAL LOW (ref 3.5–5.0)
Anion gap: 5 (ref 5–15)
Anion gap: 4 — ABNORMAL LOW (ref 5–15)
BUN: 29 mg/dL — ABNORMAL HIGH (ref 6–20)
BUN: 27 mg/dL — AB (ref 6–20)
BUN: 29 mg/dL — ABNORMAL HIGH (ref 6–20)
BUN: 25 mg/dL — ABNORMAL HIGH (ref 6–20)
CALCIUM: 8.2 mg/dL — AB (ref 8.9–10.3)
CALCIUM: 8 mg/dL — AB (ref 8.9–10.3)
CHLORIDE: 104 mmol/L (ref 101–111)
CHLORIDE: 104 mmol/L (ref 101–111)
CO2: 29 mmol/L (ref 22–32)
CO2: 29 mmol/L (ref 22–32)
CO2: 28 mmol/L (ref 22–32)
CO2: 27 mmol/L (ref 22–32)
CREATININE: 0.96 mg/dL (ref 0.44–1.00)
Calcium: 8.3 mg/dL — ABNORMAL LOW (ref 8.9–10.3)
Calcium: 8.4 mg/dL — ABNORMAL LOW (ref 8.9–10.3)
Chloride: 103 mmol/L (ref 101–111)
Chloride: 105 mmol/L (ref 101–111)
Creatinine, Ser: 0.89 mg/dL (ref 0.44–1.00)
Creatinine, Ser: 0.94 mg/dL (ref 0.44–1.00)
Creatinine, Ser: 0.83 mg/dL (ref 0.44–1.00)
GFR calc Af Amer: >60 mL/min (ref >60)
GFR calc Af Amer: >60 mL/min (ref >60)
GFR calc Af Amer: >60 mL/min (ref >60)
GFR calc non Af Amer: 57 mL/min — ABNORMAL LOW (ref >60)
GFR calc non Af Amer: 59 mL/min — ABNORMAL LOW (ref >60)
GLUCOSE: 106 mg/dL — AB (ref 65–99)
Glucose, Bld: 95 mg/dL (ref 65–99)
Glucose, Bld: 101 mg/dL — ABNORMAL HIGH (ref 65–99)
Glucose, Bld: 97 mg/dL (ref 65–99)
PHOSPHORUS: 2.3 mg/dL — AB (ref 2.5–4.6)
POTASSIUM: 4.4 mmol/L (ref 3.5–5.1)
POTASSIUM: 4.6 mmol/L (ref 3.5–5.1)
POTASSIUM: 4.5 mmol/L (ref 3.5–5.1)
Phosphorus: 2 mg/dL — ABNORMAL LOW (ref 2.5–4.6)
Phosphorus: 2 mg/dL — ABNORMAL LOW (ref 2.5–4.6)
Phosphorus: 2 mg/dL — ABNORMAL LOW (ref 2.5–4.6)
Potassium: 4.5 mmol/L (ref 3.5–5.1)
SODIUM: 137 mmol/L (ref 135–145)
Sodium: 137 mmol/L (ref 135–145)
Sodium: 137 mmol/L (ref 135–145)
Sodium: 138 mmol/L (ref 135–145)

## 2016-01-22 LAB — BLOOD GAS, ARTERIAL
ACID-BASE DEFICIT: 4.1 mmol/L — AB (ref 0.0–2.0)
Bicarbonate: 26.1 mmol/L (ref 20.0–28.0)
FIO2: 1
LHR: 30 {breaths}/min
O2 SAT: 73.4 %
PCO2 ART: 75 mmHg — AB (ref 32.0–48.0)
PEEP: 15 cmH2O
PH ART: 7.15 — AB (ref 7.350–7.450)
Patient temperature: 37
VT: 400 mL
pO2, Arterial: 51 mmHg — ABNORMAL LOW (ref 83.0–108.0)

## 2016-01-22 LAB — CBC WITH DIFFERENTIAL/PLATELET
Basophils Absolute: 0 10*3/uL (ref 0–0.1)
Basophils Relative: 0 %
EOS PCT: 1 %
Eosinophils Absolute: 0.1 10*3/uL (ref 0–0.7)
HCT: 26.8 % — ABNORMAL LOW (ref 35.0–47.0)
HEMOGLOBIN: 9 g/dL — AB (ref 12.0–16.0)
LYMPHS ABS: 0.5 10*3/uL — AB (ref 1.0–3.6)
LYMPHS PCT: 2 %
MCH: 27.6 pg (ref 26.0–34.0)
MCHC: 33.7 g/dL (ref 32.0–36.0)
MCV: 82 fL (ref 80.0–100.0)
Monocytes Absolute: 0.6 10*3/uL (ref 0.2–0.9)
Monocytes Relative: 2 %
Neutro Abs: 24.1 10*3/uL — ABNORMAL HIGH (ref 1.4–6.5)
Neutrophils Relative %: 95 %
PLATELETS: 105 10*3/uL — AB (ref 150–440)
RBC: 3.27 MIL/uL — AB (ref 3.80–5.20)
RDW: 15.8 % — ABNORMAL HIGH (ref 11.5–14.5)
WBC: 25.3 10*3/uL — AB (ref 3.6–11.0)

## 2016-01-22 LAB — BASIC METABOLIC PANEL
Anion gap: 5 (ref 5–15)
BUN: 28 mg/dL — AB (ref 6–20)
CHLORIDE: 103 mmol/L (ref 101–111)
CO2: 29 mmol/L (ref 22–32)
Calcium: 8.3 mg/dL — ABNORMAL LOW (ref 8.9–10.3)
Creatinine, Ser: 0.94 mg/dL (ref 0.44–1.00)
GFR calc Af Amer: >60 mL/min (ref >60)
GFR calc non Af Amer: 59 mL/min — ABNORMAL LOW (ref >60)
GLUCOSE: 97 mg/dL (ref 65–99)
POTASSIUM: 4.4 mmol/L (ref 3.5–5.1)
Sodium: 137 mmol/L (ref 135–145)

## 2016-01-22 LAB — GLUCOSE, CAPILLARY
GLUCOSE-CAPILLARY: 103 mg/dL — AB (ref 65–99)
GLUCOSE-CAPILLARY: 86 mg/dL (ref 65–99)
GLUCOSE-CAPILLARY: 75 mg/dL (ref 65–99)
GLUCOSE-CAPILLARY: 92 mg/dL (ref 65–99)
Glucose-Capillary: 109 mg/dL — ABNORMAL HIGH (ref 65–99)
Glucose-Capillary: 91 mg/dL (ref 65–99)

## 2016-01-22 MED ORDER — K PHOS MONO-SOD PHOS DI & MONO 155-852-130 MG PO TABS
500.0000 mg | ORAL_TABLET | ORAL | Status: AC
  Administered 2016-01-22 (×3): 500 mg via ORAL
  Filled 2016-01-22 (×3): qty 2

## 2016-01-22 MED ORDER — FENTANYL BOLUS VIA INFUSION
50.0000 ug | INTRAVENOUS | Status: DC | PRN
  Administered 2016-01-22 (×3): 50 ug via INTRAVENOUS
  Filled 2016-01-22: qty 50

## 2016-01-22 NOTE — Progress Notes (Signed)
Spoke with Hinton Dyer, NP and asked her to look at KUB results.  RN asked NP about restarting tube feeds.  NP gave order to restart tube feeds at 30cc/H.

## 2016-01-22 NOTE — Progress Notes (Signed)
[facility]  ROUNDING NOTE   Subjective:  Patient appears to be a bit better clinically today. She is arousable and follows commands. However urine output remains quite low at 100 cc over the preceding 24 hours. She has tolerated continuous renal placement therapy well.   Objective:  Vital signs in last 24 hours:  Temp:  [97.4 F (36.3 C)-98.1 F (36.7 C)] 97.4 F (36.3 C) (11/30 0800) Pulse Rate:  [105-116] 114 (11/30 1000) Resp:  [18-34] 26 (11/30 1000) BP: (95-153)/(60-94) 126/70 (11/30 1000) SpO2:  [89 %-97 %] 94 % (11/30 1000) FiO2 (%):  [40 %-60 %] 50 % (11/30 1041) Weight:  [105.3 kg (232 lb 2.3 oz)] 105.3 kg (232 lb 2.3 oz) (11/30 0500)  Weight change: -1 kg (-2 lb 3.3 oz) Filed Weights   01/20/16 0447 01/21/16 0500 01/22/16 0500  Weight: 110.2 kg (242 lb 15.2 oz) 106.3 kg (234 lb 5.6 oz) 105.3 kg (232 lb 2.3 oz)    Intake/Output: I/O last 3 completed shifts: In: 3619.9 [I.V.:1019.9; Other:240; NG/GT:2160; IV [id number]:979] Out: 2680 [Urine:370; Other:1900; Stool:410]   Intake/Output this shift:  Total I/O In: 80.1 [I.V.:80.1] Out: 189 [Other:189]  Physical Exam: General: Critically ill appearing   Head: Endotracheal tube and OG in place.   Eyes: Eyes closed   Neck: Supple, trachea midline  Lungs:  Scattered rhonchi bilateral, vent assisted   Heart: S1S2 no rubs  Abdomen:  Distended, bowel sounds present   Extremities: 1+ peripheral edema.  Neurologic: Arousable, will follow commands   Skin: No lesions  Access: Right femoral temporary dialysis catheter     Basic Metabolic Panel:  Recent Labs Lab 01/16/16 0455  01/16/16 2348 01/17/16 0450 12/29/2015 0426  01/19/16 0419  01/21/16 1400 01/21/16 1720 01/21/16 2232 01/22/16 0238 01/22/16 0630  NA 132*  < > 134* 132* 127*  < > 129*  < > 137 137 136 137 137  137  K 3.2*  < > 3.3* 3.5 4.2  < > 3.4*  < > 4.5 4.3 4.4 4.5 4.4  4.4  CL 101  < > 100* 98* 91*  < > 94*  < > 103 105 103 103  104  103  CO2 17*  < > 20* 18* 23  < > 24  < > 27 26 27 29 29  29   GLUCOSE 158*  < > 208* 245* 326*  < > 288*  < > 124* 126* 128* 106* 95  97  BUN 100*  < > 99* 102* 99*  < > 70*  < > 33* 32* 32* 29* 27*  28*  CREATININE 2.82*  < > 3.05* 3.06* 3.49*  < > 2.89*  < > 1.31* 1.12* 1.10* 0.96 0.89  0.94  CALCIUM 8.0*  < > 7.3* 7.0* 6.3*  < > 6.2*  < > 8.1* 7.8* 8.1* 8.2* 8.3*  8.3*  MG 2.3  --  2.4 2.3 2.4  --  2.1  --   --   --   --   --   --   PHOS  --   < > 3.4 3.5 7.6*  --  4.6  < > 2.4* 2.4* 2.2* 2.0* 2.0*  < > = values in this interval not displayed.  Liver Function Tests:  Recent Labs Lab 01/20/2016 1329 01/16/16 0455  01/21/16 1400 01/21/16 1720 01/21/16 2232 01/22/16 0238 01/22/16 0630  AST 88* 138*  --   --   --   --   --   --  ALT 72* 114*  --   --   --   --   --   --   ALKPHOS 251* 262*  --   --   --   --   --   --   BILITOT 2.7* 3.5*  --   --   --   --   --   --   PROT 6.3* 6.1*  --   --   --   --   --   --   ALBUMIN 2.2* 2.2*  < > 1.2* 1.2* 1.2* 1.2* 1.1*  < > = values in this interval not displayed. No results for input(s): LIPASE, AMYLASE in the last 168 hours. No results for input(s): AMMONIA in the last 168 hours.  CBC:  Recent Labs Lab 01/19/2016 1329  01/17/16 0450 01/21/2016 0426 01/20/16 0501 01/21/16 0630 01/22/16 0630  WBC 30.8*  < > 42.7* 49.3* 25.1* 24.6* 25.3*  NEUTROABS 27.8*  --   --   --  23.9*  --  24.1*  HGB 14.1  < > 11.0* 10.9* 9.7* 9.0* 9.0*  HCT 41.4  < > 32.7* 32.3* 29.1* 26.2* 26.8*  MCV 84.3  < > 83.7 84.4 84.3 83.5 82.0  PLT 99*  < > 87* 145* 66* 96* 105*  < > = values in this interval not displayed.  Cardiac Enzymes:  Recent Labs Lab 01/20/2016 1329 01/08/2016 2121 01/16/16 0144 01/16/16 0455  TROPONINI 0.17* 0.14* 0.26* 0.11*    BNP: Invalid input(s): POCBNP  CBG:  Recent Labs Lab 01/21/16 1620 01/21/16 1934 01/22/16 0018 01/22/16 0358 01/22/16 0738  GLUCAP 114* 118* 109* 103* 56    Microbiology: Results for  orders placed or performed during the hospital encounter of 01/16/2016  Urine culture     Status: Abnormal   Collection Time: 01/05/2016  1:29 PM  Result Value Ref Range Status   Specimen Description URINE, RANDOM  Final   Special Requests NONE  Final   Culture >=100,000 COLONIES/mL ESCHERICHIA COLI (A)  Final   Report Status 01/17/2016 FINAL  Final   Organism ID, Bacteria ESCHERICHIA COLI (A)  Final      Susceptibility   Escherichia coli - MIC*    AMPICILLIN >=32 RESISTANT Resistant     CEFAZOLIN <=4 SENSITIVE Sensitive     CEFTRIAXONE <=1 SENSITIVE Sensitive     CIPROFLOXACIN <=0.25 SENSITIVE Sensitive     GENTAMICIN <=1 SENSITIVE Sensitive     IMIPENEM <=0.25 SENSITIVE Sensitive     NITROFURANTOIN <=16 SENSITIVE Sensitive     TRIMETH/SULFA <=20 SENSITIVE Sensitive     AMPICILLIN/SULBACTAM >=32 RESISTANT Resistant     PIP/TAZO <=4 SENSITIVE Sensitive     Extended ESBL NEGATIVE Sensitive     * >=100,000 COLONIES/mL ESCHERICHIA COLI  Culture, blood (routine x 2)     Status: Abnormal   Collection Time: 01/21/2016  1:29 PM  Result Value Ref Range Status   Specimen Description BLOOD RIGHT HAND  Final   Special Requests   Final    BOTTLES DRAWN AEROBIC AND ANAEROBIC 11MLAERO,8MLANA   Culture  Setup Time   Final    GRAM NEGATIVE RODS IN BOTH AEROBIC AND ANAEROBIC BOTTLES CRITICAL RESULT CALLED TO, READ BACK BY AND VERIFIED WITH: NATE COOKSON AT [place] ON 01/16/16 [place]. Performed at [place] (A)  Final   Report Status 01/19/2016 FINAL  Final   Organism ID, Bacteria ESCHERICHIA COLI  Final      Susceptibility  Escherichia coli - MIC*    AMPICILLIN >=32 RESISTANT Resistant     CEFAZOLIN <=4 SENSITIVE Sensitive     CEFEPIME <=1 SENSITIVE Sensitive     CEFTAZIDIME <=1 SENSITIVE Sensitive     CEFTRIAXONE <=1 SENSITIVE Sensitive     CIPROFLOXACIN <=0.25 SENSITIVE Sensitive     GENTAMICIN <=1 SENSITIVE Sensitive     IMIPENEM <=0.25 SENSITIVE Sensitive      TRIMETH/SULFA <=20 SENSITIVE Sensitive     AMPICILLIN/SULBACTAM >=32 RESISTANT Resistant     PIP/TAZO <=4 SENSITIVE Sensitive     Extended ESBL NEGATIVE Sensitive     * ESCHERICHIA COLI  Culture, blood (routine x 2)     Status: Abnormal   Collection Time: 01/20/2016  1:29 PM  Result Value Ref Range Status   Specimen Description BLOOD LEFT ANTECUBITAL  Final   Special Requests   Final    BOTTLES DRAWN AEROBIC AND ANAEROBIC AER 12ML ANA 11ML   Culture  Setup Time   Final    GRAM NEGATIVE RODS IN BOTH AEROBIC AND ANAEROBIC BOTTLES CRITICAL VALUE NOTED.  VALUE IS CONSISTENT WITH PREVIOUSLY REPORTED AND CALLED VALUE. GRAM POSITIVE COCCI AEROBIC BOTTLE ONLY CRITICAL RESULT CALLED TO, READ BACK BY AND VERIFIED WITH: Charlane Ferretti @ 1157 01/16/16 by [facility].    Culture (A)  Final    ESCHERICHIA COLI SUSCEPTIBILITIES PERFORMED ON PREVIOUS CULTURE WITHIN THE LAST 5 DAYS. STAPHYLOCOCCUS SPECIES (COAGULASE NEGATIVE) THE SIGNIFICANCE OF ISOLATING THIS ORGANISM FROM A SINGLE SET OF BLOOD CULTURES WHEN MULTIPLE SETS ARE DRAWN IS UNCERTAIN. PLEASE NOTIFY THE MICROBIOLOGY DEPARTMENT WITHIN ONE WEEK IF SPECIATION AND SENSITIVITIES ARE REQUIRED. Performed at [facility]    Report Status 01/06/2016 FINAL  Final  Blood Culture ID Panel (Reflexed)     Status: Abnormal   Collection Time: 01/09/2016  1:29 PM  Result Value Ref Range Status   Enterococcus species NOT DETECTED NOT DETECTED Final   Listeria monocytogenes NOT DETECTED NOT DETECTED Final   Staphylococcus species NOT DETECTED NOT DETECTED Final   Staphylococcus aureus NOT DETECTED NOT DETECTED Final   Streptococcus species NOT DETECTED NOT DETECTED Final   Streptococcus agalactiae NOT DETECTED NOT DETECTED Final   Streptococcus pneumoniae NOT DETECTED NOT DETECTED Final   Streptococcus pyogenes NOT DETECTED NOT DETECTED Final   Acinetobacter baumannii NOT DETECTED NOT DETECTED Final   Enterobacteriaceae species DETECTED (A) NOT DETECTED  Final    Comment: CRITICAL RESULT CALLED TO, READ BACK BY AND VERIFIED WITH: NATE COOKSON AT 0556 ON 01/16/16 [place].    Enterobacter cloacae complex NOT DETECTED NOT DETECTED Final   Escherichia coli DETECTED (A) NOT DETECTED Final    Comment: CRITICAL RESULT CALLED TO, READ BACK BY AND VERIFIED WITH: NATE COOKSON AT 2620 ON 01/16/16 [place].    Klebsiella oxytoca NOT DETECTED NOT DETECTED Final   Klebsiella pneumoniae NOT DETECTED NOT DETECTED Final   Proteus species NOT DETECTED NOT DETECTED Final   Serratia marcescens NOT DETECTED NOT DETECTED Final   Carbapenem resistance NOT DETECTED NOT DETECTED Final   Haemophilus influenzae NOT DETECTED NOT DETECTED Final   Neisseria meningitidis NOT DETECTED NOT DETECTED Final   Pseudomonas aeruginosa NOT DETECTED NOT DETECTED Final   Candida albicans NOT DETECTED NOT DETECTED Final   Candida glabrata NOT DETECTED NOT DETECTED Final   Candida krusei NOT DETECTED NOT DETECTED Final   Candida parapsilosis NOT DETECTED NOT DETECTED Final   Candida tropicalis NOT DETECTED NOT DETECTED Final  Blood Culture ID Panel (Reflexed)     Status:  Abnormal   Collection Time: 01/07/2016  1:40 PM  Result Value Ref Range Status   Enterococcus species NOT DETECTED NOT DETECTED Final   Vancomycin resistance NOT DETECTED NOT DETECTED Final   Listeria monocytogenes NOT DETECTED NOT DETECTED Final   Staphylococcus species DETECTED (A) NOT DETECTED Final    Comment: CRITICAL RESULT CALLED TO, READ BACK BY AND VERIFIED WITH: Charlane Ferretti @ 1610 01/16/16 by [facility].    Staphylococcus aureus NOT DETECTED NOT DETECTED Final   Methicillin resistance NOT DETECTED NOT DETECTED Final   Streptococcus species NOT DETECTED NOT DETECTED Final   Streptococcus agalactiae NOT DETECTED NOT DETECTED Final   Streptococcus pneumoniae NOT DETECTED NOT DETECTED Final   Streptococcus pyogenes NOT DETECTED NOT DETECTED Final   Acinetobacter baumannii NOT DETECTED NOT DETECTED Final    Enterobacteriaceae species DETECTED (A) NOT DETECTED Final    Comment: CRITICAL RESULT CALLED TO, READ BACK BY AND VERIFIED WITH: Charlane Ferretti @ 9604 01/16/16 by [facility].    Enterobacter cloacae complex NOT DETECTED NOT DETECTED Final   Escherichia coli DETECTED (A) NOT DETECTED Final    Comment: CRITICAL RESULT CALLED TO, READ BACK BY AND VERIFIED WITH: Charlane Ferretti @ 5409 01/16/16 by [facility].    Klebsiella oxytoca NOT DETECTED NOT DETECTED Final   Klebsiella pneumoniae NOT DETECTED NOT DETECTED Final   Proteus species NOT DETECTED NOT DETECTED Final   Serratia marcescens NOT DETECTED NOT DETECTED Final   Carbapenem resistance NOT DETECTED NOT DETECTED Final   Haemophilus influenzae NOT DETECTED NOT DETECTED Final   Neisseria meningitidis NOT DETECTED NOT DETECTED Final   Pseudomonas aeruginosa NOT DETECTED NOT DETECTED Final   Candida albicans NOT DETECTED NOT DETECTED Final   Candida glabrata NOT DETECTED NOT DETECTED Final   Candida krusei NOT DETECTED NOT DETECTED Final   Candida parapsilosis NOT DETECTED NOT DETECTED Final   Candida tropicalis NOT DETECTED NOT DETECTED Final  MRSA PCR Screening     Status: None   Collection Time: 01/14/2016  7:00 PM  Result Value Ref Range Status   MRSA by PCR NEGATIVE NEGATIVE Final    Comment:        The GeneXpert MRSA Assay (FDA approved for NASAL specimens only), is one component of a comprehensive MRSA colonization surveillance program. It is not intended to diagnose MRSA infection nor to guide or monitor treatment for MRSA infections.     Coagulation Studies: No results for input(s): LABPROT, INR in the last 72 hours.  Urinalysis: No results for input(s): COLORURINE, LABSPEC, PHURINE, GLUCOSEU, HGBUR, BILIRUBINUR, KETONESUR, PROTEINUR, UROBILINOGEN, NITRITE, LEUKOCYTESUR in the last 72 hours.  Invalid input(s): APPERANCEUR    Imaging: No results found.   Medications:   . amiodarone 30 mg/hr (01/22/16 0700)  . dextrose    .  fentaNYL infusion INTRAVENOUS 100 mcg/hr (01/22/16 0700)  . norepinephrine (LEVOPHED) Adult infusion Stopped (01/21/16 0955)  . pureflow 3 each (01/22/16 0500)   . aspirin  81 mg Oral Daily  . atorvastatin  80 mg Oral QPM  . B-complex with vitamin C  1 tablet Oral Daily  . cefTRIAXone  2 g Intravenous Q24H  . chlorhexidine gluconate (MEDLINE KIT)  15 mL Mouth Rinse BID  . cholecalciferol  1,000 Units Oral Daily  . famotidine (PEPCID) IV  20 mg Intravenous Q48H  . feeding supplement (VITAL HIGH PROTEIN)  1,000 mL Per Tube Q24H  . heparin subcutaneous  5,000 Units Subcutaneous Q8H  . insulin aspart  2-6 Units Subcutaneous Q4H  . insulin aspart  6 Units Subcutaneous Q4H  . insulin glargine  35 Units Subcutaneous Q24H  . ipratropium-albuterol  3 mL Nebulization Q6H  . mouth rinse  15 mL Mouth Rinse QID  . phosphorus  500 mg Oral Q4H  . potassium chloride  20 mEq Oral Daily  . sodium chloride flush  10-40 mL Intracatheter Q12H   acetaminophen, bisacodyl, dextrose, fentaNYL, heparin, ipratropium-albuterol, metoprolol, midazolam, ondansetron **OR** ondansetron (ZOFRAN) IV, sennosides, sodium chloride flush, traMADol, vecuronium  Assessment/ Plan:  75 y.o. Caucasian female with medical problems of diabetes, hypertension, atrial fibrillation, was admitted on 01/17/2016 with sepsis from urinary source and bilateral pneumonia.   1. Acute renal failure, likely ATN from concurrent illness 2. Bilateral pneumonia 3. Acute respiratory failure requiring ventilator support 4. Sepsis. Escherichia coli in blood and urine. Hypotension requiring pressors 5. Hypocalcemia, low albumin noted 6. 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 Creatinine baseline 1.1 from October 2017. GFR 49 7. Acidosis, mixed respiratory and metabolic 8. Left hydronephrosis from 6 mm stone. Urgent stent placement early morning 11/26  Plan:  Critical illness persist. Patient is maintaining on the  ventilator. Ultrafiltration was increased to 100 cc/h just this a.m. She is in need of continued ultrafiltration. Unfortunately she is still requiring 60% FiO2 and is unable to be weaned from the ventilator at the moment. At this point in time we will continue the patient on continuous renal placement therapy and follow serum electrolytes as ordered. Prognosis remains guarded.   LOS: 7 Lissandro Dilorenzo 11/30/201710:49 AM

## 2016-01-22 NOTE — Progress Notes (Signed)
[place] changed on CRRT.   

## 2016-01-22 NOTE — Progress Notes (Signed)
Chaplain rounded the unit to provide a compassionate presence and support to the patient. Patient was awake with breathing tube. Chaplain provided silent prayer. Minerva Fester [phone]

## 2016-01-22 NOTE — Progress Notes (Signed)
Discussed need for CT abdomen with Dr. Mortimer Fries during rounds.  MD gave order to d/c CT of abdomen and to obtain KUB.

## 2016-01-22 NOTE — Progress Notes (Signed)
Subjective:  Respiratory failure atrial fibrillation relatively stable  Objective:  Vital Signs in the last 24 hours: Temp:  [97.4 F (36.3 C)-97.9 F (36.6 C)] 97.7 F (36.5 C) (11/30 1200) Pulse Rate:  [105-117] 117 (11/30 1300) Resp:  [18-34] 30 (11/30 1300) BP: (101-149)/(62-94) 132/75 (11/30 1300) SpO2:  [87 %-97 %] 87 % (11/30 1300) FiO2 (%):  [50 %-60 %] 50 % (11/30 1200) Weight:  [105.3 kg (232 lb 2.3 oz)] 105.3 kg (232 lb 2.3 oz) (11/30 0500)  Intake/Output from previous day: 11/29 0701 - 11/30 0700 In: 2243 [I.V.:613; NG/GT:1500] Out: 1659 [Urine:230; Stool:110] Intake/Output from this shift: Total I/O In: 165.2 [I.V.:165.2] Out: 349 [Other:349]  Physical Exam: General appearance: appears older than stated age Neck: no adenopathy, no carotid bruit, no JVD, supple, symmetrical, trachea midline and thyroid not enlarged, symmetric, no tenderness/mass/nodules Lungs: diminished breath sounds bibasilar and bilaterally Heart: irregularly irregular rhythm Abdomen: soft, non-tender; bowel sounds normal; no masses,  no organomegaly Extremities: extremities normal, atraumatic, no cyanosis or edema Pulses: 2+ and symmetric Skin: Skin color, texture, turgor normal. No rashes or lesions Neurologic: Mental status: Alert, oriented, thought content appropriate, Intubated sedated  Lab Results:  Recent Labs  01/21/16 0630 01/22/16 0630  WBC 24.6* 25.3*  HGB 9.0* 9.0*  PLT 96* 105*    Recent Labs  01/22/16 0630 01/22/16 1215  NA 137  137 137  K 4.4  4.4 4.6  CL 104  103 104  CO2 29  29 28   GLUCOSE 95  97 101*  BUN 27*  28* 29*  CREATININE 0.89  0.94 0.94   No results for input(s): TROPONINI in the last 72 hours.  Invalid input(s): CK, MB Hepatic Function Panel  Recent Labs  01/22/16 1215  ALBUMIN 1.2*   No results for input(s): CHOL in the last 72 hours. No results for input(s): PROTIME in the last 72 hours.  Imaging: Imaging results have been  reviewed  Cardiac Studies:  Assessment/Plan:  Respiratory failure  Atrial fibrillation Diabetes GERD Hypertension Hyperlipidemia Elevated troponin Elevated white count Acidosis . PLAN Respiratory failure Continue broad-spectrum antibiotic therapy Support blood pressure with sepsis Continue amiodarone for atrial fibrillation rhythm control Acute on chronic renal insufficiency. Nephrology Continue supplemental oxygen and inhalers Agree with critical care support Conservative cardiology input at this point      LOS: 7 days    Lanah Steines D [place] 01/22/2016, 1:18 PM

## 2016-01-22 NOTE — Progress Notes (Signed)
Rested on and off throughout shift.  Follows commands but agitated at times. Afib per cardiac monitor.  Blood pressure stable. Continues to require 50% fiO2 on vent with 15 of peep.  40 cc of UOP this shift.  Urine with clots therefore irrigated foley this evening with irrigant returned.  CRRT treatment in use. Husband visited this afternoon.

## 2016-01-22 NOTE — Progress Notes (Signed)
Spoke with Marda Stalker, NP and made her aware that patient has increased agitation and does not have fentanyl boluses ordered via infusion.  NP gave order for fentanyl bolus via infusion PRN.

## 2016-01-22 NOTE — Progress Notes (Signed)
PULMONARY / CRITICAL CARE MEDICINE   Name: Heidi Villegas MRN: [id number] DOB: [birth year]    ADMISSION DATE:  01/07/2016    CONSULTATION DATE:  01/16/16  REFERRING MD: Dr. Estanislado Pandy  CHIEF COMPLAINT:  Respiratory distress  HISTORY OF PRESENT ILLNESS:   Heidi Villegas is a 75 yo female with PMH significant for Arthritis, Hypertension and DM.  Patient presented [facility] ON 11/23 with severe sepsis, elevated lactic acid and altered mental status. On 11/24 patient was  Was noted to be severe respiratory distress along with tachycardia and hypertension.  PCCM team was consulted for further management.  Discussion 75 y/o caucasian female presenting with urosepsis, septic shock and acute hypoxic respiratory failure necessitating intubation. CT abdomen 11/25 shows obstructing left renal calculi and hydronephrosis. Taken to the OR 11/26 for a cystoscopy with left retrogate pyelogram, ureteral stent and foley placement. Severe hypercarbia post op.     REVIEW OF SYSTEMS: Unable to assess pt intubated   SUBJECTIVE:  Patient continues to be on CRRT, on vasopressors On high fio2 and PEEP Remains critically ill  VITAL SIGNS: BP 100/60   Pulse (!) 107   Temp 97.6 F (36.4 C) (Oral)   Resp (!) 30   Ht 5\' 1"  (1.549 m)   Wt 110.2 kg (242 lb 15.2 oz)   SpO2 100%   BMI 45.90 kg/m   HEMODYNAMICS: CVP:  [6 mmHg] 6 mmHg  VENTILATOR SETTINGS: Vent Mode: PRVC FiO2 (%):  [80 %-100 %] 80 % Set Rate:  [30 bmp] 30 bmp Vt Set:  [500 mL] 500 mL PEEP:  [15 cmH20] 15 cmH20  INTAKE / OUTPUT: I/O last 3 completed shifts: In: 4642.4 [I.V.:2662.4; Other:120; NG/GT:1860] Out: [id number] [Urine:355; BA:[id number]; Stool:75]  PHYSICAL EXAMINATION: General:  Acutely ill Caucasian female, well nourished HEENT:  Atraumatic, normocephalic, no discharge, no JVD appreciated Cardiovascular: irregularly irregular, no M/R/G noted, 2+ generalized edema Lungs: Diminished bibasilar, RRR even, non labored Abdomen:   Soft, nontender, active bowel sounds, flexiseal in place Musculoskeletal:  No inflammation/deformity noted Skin:  Grossly intact GCS<8T   STUDIES:  11/23 Renal ultrasound>>Mild to moderate left hydronephrosis without obstructing cause identified, right renal mass.  11/25 2-D echo>>EF 40 to 45% 11/25 CT Abd Pelvis>>Bilateral mild perinephric stranding. There is mild to moderate left hydronephrosis. Axial image 43 there is 6 mm calcified obstructive calculus in proximal left ureter at the level of upper endplate of L4 vertebral body. There is a Foley catheter within decompressed urinary bladder. No small bowel obstruction.  Few diverticula are noted descending colon. No evidence of acute colitis or diverticulitis. Status post hysterectomy. Degenerative changes thoracolumbar spine. There is about 4 mm anterolisthesis L5 on S1 vertebral body. Bilateral lower lobe posterior consolidation with air bronchogram highly suspicious for pneumonia.  SIGNIFICANT EVENTS: 11/23-Pt admitted with severe sepsis secondary to pylonephritis 11/24-Pt iin severe respiratory distress , requiring BiPAP 11/25-Pt failed Bipap requiring mechanical intubation 11/26- Dialysis per nephro LINES/TUBES: 01/16/16 Right IJ>> 11/25 ETT>> Right femoral dual lumen HD catheter 11/26>>    ASSESSMENT / PLAN:  75 y/o caucasian female presenting with urosepsis, septic shock and acute hypoxic respiratory failure necessitating intubation. CT abdomen 11/25 shows obstructing left renal calculi and hydronephrosis. Taken to the OR 11/26 for a cystoscopy with left retrogate pyelogram, ureteral stent and foley placement. Severe hypercarbia post op.   Patient with multiorgan failure, critically ill  PULMONARY A: Acute hypoxic respiratory failure possibly related to  septic shock-Failed BiPAP and emergently intubated 11/25 due to persistent hypoxemia, tachypnea and  tachycardia with worsening mental status-severe hypercarbia post op Mix  Metabolic and respiratory  acidosis.  P:   Full vent support; settings adjusted with increased PEEP 15 wean as tolerated Repeat ABG Routine CXR Continue Nebulized bronchodilators. VAP Protocol  CARDIOVASCULAR A:  Septic shock secondary to urosepsis-improving Tachycardia-improving Elevated troponin likely demand ischemia secondary to acute respiratory failure and septic shock New onset Afib with RVR P:  Continuous telemetry Continue amlodipine Continue Amiodarone gtt Cardiology consulted appreciate input Levophed gtt as needed to maintain map >65  RENAL A:   AKI likely secondary to sepsis-improving Left hydronephrosis with obstructing renal calculi on CT S/P STAT cystoscopy with stent placement 01/04/2016  Right renal mass-not apparent on CT abdomen P:   Follow BMET Avoid nephrotoxic drugs Replace electrolytes per ICU protocol Urology consulted appreciate input Nephrology consulted appreciate input On CRRT   GASTROINTESTINAL A:   Diarrhea Abdominal distension concerning for?illeus/bowel ischemia P:   Continue tube feedings Orogastric tube care  Tube feeds as tolerated Protonix for GI prophylaxis Hold bowel regimen for now Follow up on CT Abdomen  HEMATOLOGIC A:   Anemia P:  Heparin for VTE prophylaxis Trend CBC's Transfuse for hgb <7 Monitor for s/sx of bleeding  INFECTIOUS A:   Urosepsis with septic shock-worsening leukocytosis; blood cultures positive for Escherichia coli, Enterobacteriaceae species, and Staphylococcus species in both aerobic and anaerobic bottles. Final cultures pending. Urine culture positive for Escherichia coli with a colony count greater than 100,000 Leukocytosis with WBC=49 Hypothermia-resolved P:   Trend WBC's and monitor fever curve Trend PCT's Continue Ceftriaxone  Follow cultures  CULTURES: 11/23 BC; GNR; PCR- enterobacter, Ecoli in both aerobic and anaerobic bottles  11/23 UC; Escherichia coli 11/24 Flu negative MRSA  PCR 11/23 negative.   ANTIBIOTICS: 11/23 Vancomycin>>11/24 11/23 Zosyn>>11/23 11/25 Unasyn x 1dose 11/24 Meropenem>>11/27 11/27 Ceftriaxone>>  ENDOCRINE A:   Diabetes Melitus P:   Insulin gtt CBG's q1hrs  NEUROLOGIC A:   Acute metabolic encephalopathy likely secondary to sepsis P:   RASS score 0 to -1 Fentanyl and  versedto maintain RASS goal WUA daily Lights on during the day     I have personally obtained a history, examined the patient, evaluated Pertinent laboratory and RadioGraphic/imaging results, and  formulated the assessment and plan   The Patient requires high complexity decision making for assessment and support, frequent evaluation and titration of therapies, application of advanced monitoring technologies and extensive interpretation of multiple databases. Critical Care Time devoted to patient care services described in this note is 35 minutes.   Overall, patient is critically ill, prognosis is guarded.  Patient with Multiorgan failure and at high risk for cardiac arrest and death.    Corrin Parker, M.D.  [facility]  Medical Director [place] Director [facility] Cardio-Pulmonary Department

## 2016-01-22 NOTE — Progress Notes (Signed)
Pharmacy Antibiotic Note/ Electrolyte Monitoring/ CRRT Medication Adjustment  Heidi Villegas is a 75 y.o. female admitted on 01/04/2016 with sepsis.  Pharmacy has been consulted for ceftriaxone dosing.  Pharmacy also consulted to assist in electrolyte monitoring and to adjust medications based on CRRT. Pt started CRRT 11/28 and appears to be doing better today (11/29)  Plan: 1. Antibiotics Continue ceftriaxone 2 g IV q 24 hours for E.coli bacteremia. Per rounds, added stop date to total 14 days of abx therapy.  2. Electrolytes 11/30 Phos = 2.0 Gave kphos neutral tablet 500 mg q4 hours x 4 doses. Will f/u AM labs and continue to replace per protocol  3. CRRT No medication adjustments needed at this time. Per rounds, pt will continue on CRRT for now. Will continue to monitor.   Height: 5\' 1"  (154.9 cm) Weight: 232 lb 2.3 oz (105.3 kg) IBW/kg (Calculated) : 47.8  Temp (24hrs), Avg:97.7 F (36.5 C), Min:97.4 F (36.3 C), Max:98.1 F (36.7 C)   Recent Labs Lab 12/24/2015 2121 01/16/16 0455  01/16/16 2348 01/17/16 0450 01/17/16 0751 01/17/16 1651 01/03/2016 0426  01/20/16 0501  01/21/16 0630  01/21/16 1400 01/21/16 1720 01/21/16 2232 01/22/16 0238 01/22/16 0630  WBC  --  25.7*  --   --  42.7*  --   --  49.3*  --  25.1*  --  24.6*  --   --   --   --   --  25.3*  CREATININE  --  2.82*  < > 3.05* 3.06*  --   --  3.49*  < > 2.81*  < > 1.73*  < > 1.31* 1.12* 1.10* 0.96 0.89  0.94  LATICACIDVEN 2.1* 2.2*  --  1.7  --  1.6 0.9  --   --   --   --   --   --   --   --   --   --   --   < > = values in this interval not displayed.  Estimated Creatinine Clearance: 62.9 mL/min (by C-G formula based on SCr of 0.89 mg/dL).    Allergies  Allergen Reactions  . Codeine Nausea And Vomiting  . Sulfa Antibiotics     Antimicrobials this admission: Piperacillin/tazobactam 11/23 >> 11/24 Vancomycin 11/23 >> 11/24, 11/25 >> 11/26 Meropenem 11/24 >> 11/27 Ceftriaxone 11/27 >>  Microbiology  results: 11/23 BCx: E coli 2/2, staph 1/2, S ceftriaxone 11/23 UCx: E coli resistant to ampicillin and amp/sulbactam 11/23 Sputum: pending  11/23 MRSA PCR: negative  Thank you for allowing pharmacy to be a part of this patient's care.  Darrow Bussing, PharmD Pharmacy Resident 01/22/2016 11:46 AM

## 2016-01-23 DIAGNOSIS — L8990 Pressure ulcer of unspecified site, unspecified stage: Secondary | ICD-10-CM | POA: Insufficient documentation

## 2016-01-23 LAB — RENAL FUNCTION PANEL
ALBUMIN: 1 g/dL — AB (ref 3.5–5.0)
ALBUMIN: 1 g/dL — AB (ref 3.5–5.0)
ALBUMIN: 1 g/dL — AB (ref 3.5–5.0)
ALBUMIN: 1.2 g/dL — AB (ref 3.5–5.0)
ALBUMIN: 1.2 g/dL — AB (ref 3.5–5.0)
ANION GAP: 5 (ref 5–15)
ANION GAP: 5 (ref 5–15)
ANION GAP: 3 — AB (ref 5–15)
Albumin: 1.1 g/dL — ABNORMAL LOW (ref 3.5–5.0)
Albumin: 1 g/dL — ABNORMAL LOW (ref 3.5–5.0)
Anion gap: 4 — ABNORMAL LOW (ref 5–15)
Anion gap: 5 (ref 5–15)
Anion gap: 5 (ref 5–15)
Anion gap: 6 (ref 5–15)
BUN: 23 mg/dL — ABNORMAL HIGH (ref 6–20)
BUN: 24 mg/dL — AB (ref 6–20)
BUN: 24 mg/dL — ABNORMAL HIGH (ref 6–20)
BUN: 24 mg/dL — ABNORMAL HIGH (ref 6–20)
BUN: 24 mg/dL — ABNORMAL HIGH (ref 6–20)
BUN: 25 mg/dL — AB (ref 6–20)
BUN: 25 mg/dL — ABNORMAL HIGH (ref 6–20)
CALCIUM: 8.3 mg/dL — AB (ref 8.9–10.3)
CALCIUM: 7.4 mg/dL — AB (ref 8.9–10.3)
CHLORIDE: 105 mmol/L (ref 101–111)
CHLORIDE: 106 mmol/L (ref 101–111)
CHLORIDE: 105 mmol/L (ref 101–111)
CHLORIDE: 106 mmol/L (ref 101–111)
CO2: 26 mmol/L (ref 22–32)
CO2: 26 mmol/L (ref 22–32)
CO2: 27 mmol/L (ref 22–32)
CO2: 28 mmol/L (ref 22–32)
CO2: 28 mmol/L (ref 22–32)
CO2: 28 mmol/L (ref 22–32)
CO2: 29 mmol/L (ref 22–32)
CREATININE: 0.62 mg/dL (ref 0.44–1.00)
CREATININE: 0.73 mg/dL (ref 0.44–1.00)
CREATININE: 0.78 mg/dL (ref 0.44–1.00)
CREATININE: 0.82 mg/dL (ref 0.44–1.00)
CREATININE: 0.82 mg/dL (ref 0.44–1.00)
Calcium: 7.9 mg/dL — ABNORMAL LOW (ref 8.9–10.3)
Calcium: 8 mg/dL — ABNORMAL LOW (ref 8.9–10.3)
Calcium: 8.1 mg/dL — ABNORMAL LOW (ref 8.9–10.3)
Calcium: 8.1 mg/dL — ABNORMAL LOW (ref 8.9–10.3)
Calcium: 8.2 mg/dL — ABNORMAL LOW (ref 8.9–10.3)
Chloride: 104 mmol/L (ref 101–111)
Chloride: 105 mmol/L (ref 101–111)
Chloride: 109 mmol/L (ref 101–111)
Creatinine, Ser: 0.73 mg/dL (ref 0.44–1.00)
Creatinine, Ser: 0.7 mg/dL (ref 0.44–1.00)
GFR calc Af Amer: >60 mL/min (ref >60)
GFR calc Af Amer: >60 mL/min (ref >60)
GFR calc non Af Amer: >60 mL/min (ref >60)
GFR calc non Af Amer: >60 mL/min (ref >60)
GFR calc non Af Amer: >60 mL/min (ref >60)
GLUCOSE: 117 mg/dL — AB (ref 65–99)
GLUCOSE: 148 mg/dL — AB (ref 65–99)
GLUCOSE: 159 mg/dL — AB (ref 65–99)
GLUCOSE: 159 mg/dL — AB (ref 65–99)
Glucose, Bld: 128 mg/dL — ABNORMAL HIGH (ref 65–99)
Glucose, Bld: 142 mg/dL — ABNORMAL HIGH (ref 65–99)
Glucose, Bld: 148 mg/dL — ABNORMAL HIGH (ref 65–99)
PHOSPHORUS: 2.2 mg/dL — AB (ref 2.5–4.6)
PHOSPHORUS: 2.3 mg/dL — AB (ref 2.5–4.6)
PHOSPHORUS: 2.4 mg/dL — AB (ref 2.5–4.6)
PHOSPHORUS: 2.4 mg/dL — AB (ref 2.5–4.6)
PHOSPHORUS: 2.5 mg/dL (ref 2.5–4.6)
POTASSIUM: 4.3 mmol/L (ref 3.5–5.1)
POTASSIUM: 4.4 mmol/L (ref 3.5–5.1)
POTASSIUM: 4.4 mmol/L (ref 3.5–5.1)
POTASSIUM: 4.6 mmol/L (ref 3.5–5.1)
Phosphorus: 2.1 mg/dL — ABNORMAL LOW (ref 2.5–4.6)
Phosphorus: 2.2 mg/dL — ABNORMAL LOW (ref 2.5–4.6)
Potassium: 4.6 mmol/L (ref 3.5–5.1)
Potassium: 4.4 mmol/L (ref 3.5–5.1)
Potassium: 4.4 mmol/L (ref 3.5–5.1)
SODIUM: 137 mmol/L (ref 135–145)
SODIUM: 137 mmol/L (ref 135–145)
SODIUM: 138 mmol/L (ref 135–145)
SODIUM: 138 mmol/L (ref 135–145)
Sodium: 138 mmol/L (ref 135–145)
Sodium: 138 mmol/L (ref 135–145)
Sodium: 139 mmol/L (ref 135–145)

## 2016-01-23 LAB — CBC WITH DIFFERENTIAL/PLATELET
BASOS PCT: 0 %
Basophils Absolute: 0 10*3/uL (ref 0–0.1)
Basophils Absolute: 0 10*3/uL (ref 0–0.1)
Basophils Relative: 0 %
EOS ABS: 0.1 10*3/uL (ref 0–0.7)
EOS ABS: 0.1 10*3/uL (ref 0–0.7)
EOS PCT: 0 %
Eosinophils Relative: 1 %
HCT: 25.5 % — ABNORMAL LOW (ref 35.0–47.0)
HEMATOCRIT: 26.1 % — AB (ref 35.0–47.0)
HEMOGLOBIN: 8.7 g/dL — AB (ref 12.0–16.0)
HEMOGLOBIN: 8.8 g/dL — AB (ref 12.0–16.0)
LYMPHS ABS: 0.5 10*3/uL — AB (ref 1.0–3.6)
LYMPHS ABS: 0.9 10*3/uL — AB (ref 1.0–3.6)
Lymphocytes Relative: 2 %
Lymphocytes Relative: 4 %
MCH: 28 pg (ref 26.0–34.0)
MCH: 28.3 pg (ref 26.0–34.0)
MCHC: 34 g/dL (ref 32.0–36.0)
MCHC: 33.7 g/dL (ref 32.0–36.0)
MCV: 82.4 fL (ref 80.0–100.0)
MCV: 83.9 fL (ref 80.0–100.0)
MONOS PCT: 3 %
MONOS PCT: 2 %
Monocytes Absolute: 0.7 10*3/uL (ref 0.2–0.9)
Monocytes Absolute: 0.5 10*3/uL (ref 0.2–0.9)
NEUTROS ABS: 22.1 10*3/uL — AB (ref 1.4–6.5)
NEUTROS PCT: 95 %
NEUTROS PCT: 93 %
Neutro Abs: 21 10*3/uL — ABNORMAL HIGH (ref 1.4–6.5)
PLATELETS: 95 10*3/uL — AB (ref 150–440)
Platelets: 109 10*3/uL — ABNORMAL LOW (ref 150–440)
RBC: 3.1 MIL/uL — AB (ref 3.80–5.20)
RBC: 3.11 MIL/uL — AB (ref 3.80–5.20)
RDW: 16 % — ABNORMAL HIGH (ref 11.5–14.5)
RDW: 16.4 % — ABNORMAL HIGH (ref 11.5–14.5)
WBC: 22.4 10*3/uL — AB (ref 3.6–11.0)
WBC: 23.8 10*3/uL — AB (ref 3.6–11.0)

## 2016-01-23 LAB — CBC
HCT: 19.2 % — ABNORMAL LOW (ref 35.0–47.0)
Hemoglobin: 6.3 g/dL — ABNORMAL LOW (ref 12.0–16.0)
MCH: 27.2 pg (ref 26.0–34.0)
MCHC: 32.6 g/dL (ref 32.0–36.0)
MCV: 83.2 fL (ref 80.0–100.0)
Platelets: 75 10*3/uL — ABNORMAL LOW (ref 150–440)
RBC: 2.31 MIL/uL — ABNORMAL LOW (ref 3.80–5.20)
RDW: 16.3 % — AB (ref 11.5–14.5)
WBC: 20.6 10*3/uL — AB (ref 3.6–11.0)

## 2016-01-23 LAB — GLUCOSE, CAPILLARY
GLUCOSE-CAPILLARY: 116 mg/dL — AB (ref 65–99)
GLUCOSE-CAPILLARY: 149 mg/dL — AB (ref 65–99)
Glucose-Capillary: 144 mg/dL — ABNORMAL HIGH (ref 65–99)
Glucose-Capillary: 146 mg/dL — ABNORMAL HIGH (ref 65–99)
Glucose-Capillary: 148 mg/dL — ABNORMAL HIGH (ref 65–99)
Glucose-Capillary: 97 mg/dL (ref 65–99)

## 2016-01-23 LAB — OCCULT BLOOD X 1 CARD TO LAB, STOOL: FECAL OCCULT BLD: POSITIVE — AB

## 2016-01-23 LAB — PROTIME-INR
INR: 1.28
PROTHROMBIN TIME: 16.1 s — AB (ref 11.4–15.2)

## 2016-01-23 LAB — C DIFFICILE QUICK SCREEN W PCR REFLEX
C DIFFICLE (CDIFF) ANTIGEN: NEGATIVE
C Diff interpretation: NOT DETECTED
C Diff toxin: NEGATIVE

## 2016-01-23 MED ORDER — SODIUM CHLORIDE 0.9 % IV SOLN
80.0000 mg | Freq: Once | INTRAVENOUS | Status: AC
  Administered 2016-01-23: 80 mg via INTRAVENOUS
  Filled 2016-01-23: qty 80

## 2016-01-23 MED ORDER — INSULIN GLARGINE 100 UNIT/ML ~~LOC~~ SOLN
17.0000 [IU] | SUBCUTANEOUS | Status: DC
  Administered 2016-01-23 – 2016-01-27 (×5): 17 [IU] via SUBCUTANEOUS
  Filled 2016-01-23 (×5): qty 0.17

## 2016-01-23 MED ORDER — METHYLPREDNISOLONE SODIUM SUCC 40 MG IJ SOLR
40.0000 mg | Freq: Two times a day (BID) | INTRAMUSCULAR | Status: DC
  Administered 2016-01-23 – 2016-01-28 (×10): 40 mg via INTRAVENOUS
  Filled 2016-01-23 (×10): qty 1

## 2016-01-23 MED ORDER — FUROSEMIDE 10 MG/ML IJ SOLN
20.0000 mg | Freq: Once | INTRAMUSCULAR | Status: AC
  Administered 2016-01-24: 20 mg via INTRAVENOUS
  Filled 2016-01-23: qty 2

## 2016-01-23 MED ORDER — POTASSIUM PHOSPHATE MONOBASIC 500 MG PO TABS
500.0000 mg | ORAL_TABLET | ORAL | Status: DC
  Filled 2016-01-23 (×2): qty 1

## 2016-01-23 MED ORDER — PANTOPRAZOLE SODIUM 40 MG IV SOLR: 40.0000 mg | Freq: Two times a day (BID) | INTRAVENOUS | Status: DC

## 2016-01-23 MED ORDER — SODIUM CHLORIDE 0.9 % IV SOLN
Freq: Once | INTRAVENOUS | Status: AC
  Administered 2016-01-24: 01:00:00 via INTRAVENOUS

## 2016-01-23 MED ORDER — PRO-STAT SUGAR FREE PO LIQD
30.0000 mL | Freq: Two times a day (BID) | ORAL | Status: DC
  Administered 2016-01-23 (×2): 30 mL via ORAL

## 2016-01-23 MED ORDER — SODIUM CHLORIDE 0.9 % IV SOLN
8.0000 mg/h | INTRAVENOUS | Status: AC
Start: 2016-01-23 — End: 2016-01-26
  Administered 2016-01-23 – 2016-01-26 (×7): 8 mg/h via INTRAVENOUS
  Filled 2016-01-23 (×8): qty 80

## 2016-01-23 MED ORDER — K PHOS MONO-SOD PHOS DI & MONO 155-852-130 MG PO TABS
500.0000 mg | ORAL_TABLET | ORAL | Status: AC
  Administered 2016-01-23 (×2): 500 mg via ORAL
  Filled 2016-01-23 (×2): qty 2

## 2016-01-23 MED ORDER — VITAL AF 1.2 CAL PO LIQD
1000.0000 mL | ORAL | Status: DC
Start: 2016-01-23 — End: 2016-01-24
  Administered 2016-01-23: 1000 mL

## 2016-01-23 NOTE — Progress Notes (Signed)
Informed M. Tokov, NP of hgb of 6.3

## 2016-01-23 NOTE — Progress Notes (Signed)
[facility]  ROUNDING NOTE   Subjective:  Patient continues on continuous renal placement therapy. She appears to be tolerating well. Good ultrafiltration was achieved yesterday. Still remains on the ventilator however.  Objective:  Vital signs in last 24 hours:  Temp:  [97.4 F (36.3 C)-98.8 F (37.1 C)] 97.8 F (36.6 C) (12/01 0500) Pulse Rate:  [105-121] 111 (12/01 0600) Resp:  [20-31] 31 (12/01 0700) BP: (86-142)/(47-81) 86/64 (12/01 0700) SpO2:  [87 %-95 %] 94 % (12/01 0600) FiO2 (%):  [40 %-60 %] 50 % (12/01 0428) Weight:  [102.6 kg (226 lb 3.1 oz)] 102.6 kg (226 lb 3.1 oz) (12/01 0718)  Weight change:  Filed Weights   01/21/16 0500 01/22/16 0500 01/23/16 0718  Weight: 106.3 kg (234 lb 5.6 oz) 105.3 kg (232 lb 2.3 oz) 102.6 kg (226 lb 3.1 oz)    Intake/Output: I/O last 3 completed shifts: In: 3102.5 [I.V.:1065.5; Other:160; NG/GT:1827; IV Piggyback:50] Out: 3064 [Urine:280; JJOAC:1660; Stool:60]   Intake/Output this shift:  No intake/output data recorded.  Physical Exam: General: Critically ill appearing   Head: Endotracheal tube and OG in place.   Eyes: Eyes closed   Neck: Supple, trachea midline  Lungs:  Scattered rhonchi bilateral, vent assisted   Heart: S1S2 irregular  Abdomen:  Distended, bowel sounds present   Extremities: 1+ peripheral edema.  Neurologic: Arousable, currently not following commands  Skin: No lesions  Access: Right femoral temporary dialysis catheter     Basic Metabolic Panel:  Recent Labs Lab 01/16/16 2348 01/17/16 0450 01/19/2016 0426  01/19/16 0419  01/22/16 1215 01/22/16 1828 01/22/16 2338 01/23/16 0226 01/23/16 0405  NA 134* 132* 127*  < > 129*  < > 137 138 137 138 138  K 3.3* 3.5 4.2  < > 3.4*  < > 4.6 4.5 4.4 4.6 4.6  CL 100* 98* 91*  < > 94*  < > 104 105 105 105 104  CO2 20* 18* 23  < > 24  < > _0 GLUCOSE 208* 245* 326*  < > 288*  < > 101* 97 117* 128* 148*  BUN 99* 102* 99*  < > 70*  < >  29* 25* 25* 25* 24*  CREATININE 3.05* 3.06* 3.49*  < > 2.89*  < > 0.94 0.83 0.78 0.82 0.82  CALCIUM 7.3* 7.0* 6.3*  < > 6.2*  < > 8.4* 8.0* 7.9* 8.2* 8.3*  MG 2.4 2.3 2.4  --  2.1  --   --   --   --   --   --   PHOS 3.4 3.5 7.6*  --  4.6  < > 2.0* 2.3* 2.2* 2.5 2.4*  < > = values in this interval not displayed.  Liver Function Tests:  Recent Labs Lab 01/22/16 1215 01/22/16 1828 01/22/16 2338 01/23/16 0226 01/23/16 0405  ALBUMIN 1.2* 1.1* 1.2* 1.2* 1.0*   No results for input(s): LIPASE, AMYLASE in the last 168 hours. No results for input(s): AMMONIA in the last 168 hours.  CBC:  Recent Labs Lab 01/11/2016 0426 01/20/16 0501 01/21/16 0630 01/22/16 0630 01/23/16 0405  WBC 49.3* 25.1* 24.6* 25.3* 22.4*  NEUTROABS  --  23.9*  --  24.1* 21.0*  HGB 10.9* 9.7* 9.0* 9.0* 8.7*  HCT 32.3* 29.1* 26.2* 26.8* 25.5*  MCV 84.4 84.3 83.5 82.0 82.4  PLT 145* 66* 96* 105* 95*    Cardiac Enzymes: No results for input(s): CKTOTAL, CKMB, CKMBINDEX, TROPONINI in the last 168 hours.  BNP:  Invalid input(s): POCBNP  CBG:  Recent Labs Lab 01/22/16 1126 01/22/16 1600 01/22/16 1951 01/23/16 0012 01/23/16 0405  GLUCAP 91 75 92 97 116*    Microbiology: Results for orders placed or performed during the hospital encounter of 01/22/2016  Urine culture     Status: Abnormal   Collection Time: 01/01/2016  1:29 PM  Result Value Ref Range Status   Specimen Description URINE, RANDOM  Final   Special Requests NONE  Final   Culture >=100,000 COLONIES/mL ESCHERICHIA COLI (A)  Final   Report Status 01/17/2016 FINAL  Final   Organism ID, Bacteria ESCHERICHIA COLI (A)  Final      Susceptibility   Escherichia coli - MIC*    AMPICILLIN >=32 RESISTANT Resistant     CEFAZOLIN <=4 SENSITIVE Sensitive     CEFTRIAXONE <=1 SENSITIVE Sensitive     CIPROFLOXACIN <=0.25 SENSITIVE Sensitive     GENTAMICIN <=1 SENSITIVE Sensitive     IMIPENEM <=0.25 SENSITIVE Sensitive     NITROFURANTOIN <=16 SENSITIVE  Sensitive     TRIMETH/SULFA <=20 SENSITIVE Sensitive     AMPICILLIN/SULBACTAM >=32 RESISTANT Resistant     PIP/TAZO <=4 SENSITIVE Sensitive     Extended ESBL NEGATIVE Sensitive     * >=100,000 COLONIES/mL ESCHERICHIA COLI  Culture, blood (routine x 2)     Status: Abnormal   Collection Time: 01/04/2016  1:29 PM  Result Value Ref Range Status   Specimen Description BLOOD RIGHT HAND  Final   Special Requests   Final    BOTTLES DRAWN AEROBIC AND ANAEROBIC 11MLAERO,8MLANA   Culture  Setup Time   Final    GRAM NEGATIVE RODS IN BOTH AEROBIC AND ANAEROBIC BOTTLES CRITICAL RESULT CALLED TO, READ BACK BY AND VERIFIED WITH: NATE COOKSON AT [facility] ON 01/16/16 [place]. Performed at [facility] (A)  Final   Report Status 01/19/2016 FINAL  Final   Organism ID, Bacteria ESCHERICHIA COLI  Final      Susceptibility   Escherichia coli - MIC*    AMPICILLIN >=32 RESISTANT Resistant     CEFAZOLIN <=4 SENSITIVE Sensitive     CEFEPIME <=1 SENSITIVE Sensitive     CEFTAZIDIME <=1 SENSITIVE Sensitive     CEFTRIAXONE <=1 SENSITIVE Sensitive     CIPROFLOXACIN <=0.25 SENSITIVE Sensitive     GENTAMICIN <=1 SENSITIVE Sensitive     IMIPENEM <=0.25 SENSITIVE Sensitive     TRIMETH/SULFA <=20 SENSITIVE Sensitive     AMPICILLIN/SULBACTAM >=32 RESISTANT Resistant     PIP/TAZO <=4 SENSITIVE Sensitive     Extended ESBL NEGATIVE Sensitive     * ESCHERICHIA COLI  Culture, blood (routine x 2)     Status: Abnormal   Collection Time: 01/16/2016  1:29 PM  Result Value Ref Range Status   Specimen Description BLOOD LEFT ANTECUBITAL  Final   Special Requests   Final    BOTTLES DRAWN AEROBIC AND ANAEROBIC AER 12ML ANA 11ML   Culture  Setup Time   Final    GRAM NEGATIVE RODS IN BOTH AEROBIC AND ANAEROBIC BOTTLES CRITICAL VALUE NOTED.  VALUE IS CONSISTENT WITH PREVIOUSLY REPORTED AND CALLED VALUE. GRAM POSITIVE COCCI AEROBIC BOTTLE ONLY CRITICAL RESULT CALLED TO, READ BACK BY AND VERIFIED  WITH: Charlane Ferretti @ 2637 01/16/16 by [facility].    Culture (A)  Final    ESCHERICHIA COLI SUSCEPTIBILITIES PERFORMED ON PREVIOUS CULTURE WITHIN THE LAST 5 DAYS. STAPHYLOCOCCUS SPECIES (COAGULASE NEGATIVE) THE SIGNIFICANCE OF ISOLATING THIS ORGANISM FROM A SINGLE SET OF  BLOOD CULTURES WHEN MULTIPLE SETS ARE DRAWN IS UNCERTAIN. PLEASE NOTIFY THE MICROBIOLOGY DEPARTMENT WITHIN ONE WEEK IF SPECIATION AND SENSITIVITIES ARE REQUIRED. Performed at [facility]    Report Status 01/20/2016 FINAL  Final  Blood Culture ID Panel (Reflexed)     Status: Abnormal   Collection Time: 12/26/2015  1:29 PM  Result Value Ref Range Status   Enterococcus species NOT DETECTED NOT DETECTED Final   Listeria monocytogenes NOT DETECTED NOT DETECTED Final   Staphylococcus species NOT DETECTED NOT DETECTED Final   Staphylococcus aureus NOT DETECTED NOT DETECTED Final   Streptococcus species NOT DETECTED NOT DETECTED Final   Streptococcus agalactiae NOT DETECTED NOT DETECTED Final   Streptococcus pneumoniae NOT DETECTED NOT DETECTED Final   Streptococcus pyogenes NOT DETECTED NOT DETECTED Final   Acinetobacter baumannii NOT DETECTED NOT DETECTED Final   Enterobacteriaceae species DETECTED (A) NOT DETECTED Final    Comment: CRITICAL RESULT CALLED TO, READ BACK BY AND VERIFIED WITH: NATE COOKSON AT 0556 ON 01/16/16 [place].    Enterobacter cloacae complex NOT DETECTED NOT DETECTED Final   Escherichia coli DETECTED (A) NOT DETECTED Final    Comment: CRITICAL RESULT CALLED TO, READ BACK BY AND VERIFIED WITH: NATE COOKSON AT 4034 ON 01/16/16 [place].    Klebsiella oxytoca NOT DETECTED NOT DETECTED Final   Klebsiella pneumoniae NOT DETECTED NOT DETECTED Final   Proteus species NOT DETECTED NOT DETECTED Final   Serratia marcescens NOT DETECTED NOT DETECTED Final   Carbapenem resistance NOT DETECTED NOT DETECTED Final   Haemophilus influenzae NOT DETECTED NOT DETECTED Final   Neisseria meningitidis NOT DETECTED NOT DETECTED  Final   Pseudomonas aeruginosa NOT DETECTED NOT DETECTED Final   Candida albicans NOT DETECTED NOT DETECTED Final   Candida glabrata NOT DETECTED NOT DETECTED Final   Candida krusei NOT DETECTED NOT DETECTED Final   Candida parapsilosis NOT DETECTED NOT DETECTED Final   Candida tropicalis NOT DETECTED NOT DETECTED Final  Blood Culture ID Panel (Reflexed)     Status: Abnormal   Collection Time: 12/25/2015  1:40 PM  Result Value Ref Range Status   Enterococcus species NOT DETECTED NOT DETECTED Final   Vancomycin resistance NOT DETECTED NOT DETECTED Final   Listeria monocytogenes NOT DETECTED NOT DETECTED Final   Staphylococcus species DETECTED (A) NOT DETECTED Final    Comment: CRITICAL RESULT CALLED TO, READ BACK BY AND VERIFIED WITH: Charlane Ferretti @ 7425 01/16/16 by [facility].    Staphylococcus aureus NOT DETECTED NOT DETECTED Final   Methicillin resistance NOT DETECTED NOT DETECTED Final   Streptococcus species NOT DETECTED NOT DETECTED Final   Streptococcus agalactiae NOT DETECTED NOT DETECTED Final   Streptococcus pneumoniae NOT DETECTED NOT DETECTED Final   Streptococcus pyogenes NOT DETECTED NOT DETECTED Final   Acinetobacter baumannii NOT DETECTED NOT DETECTED Final   Enterobacteriaceae species DETECTED (A) NOT DETECTED Final    Comment: CRITICAL RESULT CALLED TO, READ BACK BY AND VERIFIED WITH: Charlane Ferretti @ 9563 01/16/16 by [facility].    Enterobacter cloacae complex NOT DETECTED NOT DETECTED Final   Escherichia coli DETECTED (A) NOT DETECTED Final    Comment: CRITICAL RESULT CALLED TO, READ BACK BY AND VERIFIED WITH: Charlane Ferretti @ 8756 01/16/16 by [facility].    Klebsiella oxytoca NOT DETECTED NOT DETECTED Final   Klebsiella pneumoniae NOT DETECTED NOT DETECTED Final   Proteus species NOT DETECTED NOT DETECTED Final   Serratia marcescens NOT DETECTED NOT DETECTED Final   Carbapenem resistance NOT DETECTED NOT DETECTED Final  Haemophilus influenzae NOT DETECTED NOT DETECTED Final   Neisseria  meningitidis NOT DETECTED NOT DETECTED Final   Pseudomonas aeruginosa NOT DETECTED NOT DETECTED Final   Candida albicans NOT DETECTED NOT DETECTED Final   Candida glabrata NOT DETECTED NOT DETECTED Final   Candida krusei NOT DETECTED NOT DETECTED Final   Candida parapsilosis NOT DETECTED NOT DETECTED Final   Candida tropicalis NOT DETECTED NOT DETECTED Final  MRSA PCR Screening     Status: None   Collection Time: 01/07/2016  7:00 PM  Result Value Ref Range Status   MRSA by PCR NEGATIVE NEGATIVE Final    Comment:        The GeneXpert MRSA Assay (FDA approved for NASAL specimens only), is one component of a comprehensive MRSA colonization surveillance program. It is not intended to diagnose MRSA infection nor to guide or monitor treatment for MRSA infections.     Coagulation Studies: No results for input(s): LABPROT, INR in the last 72 hours.  Urinalysis: No results for input(s): COLORURINE, LABSPEC, PHURINE, GLUCOSEU, HGBUR, BILIRUBINUR, KETONESUR, PROTEINUR, UROBILINOGEN, NITRITE, LEUKOCYTESUR in the last 72 hours.  Invalid input(s): APPERANCEUR    Imaging: Dg Abd 1 View  Result Date: 01/22/2016 CLINICAL DATA:  Morbid obesity, abdominal distension. EXAM: ABDOMEN - 1 VIEW COMPARISON:  Abdominal radiographs of January 18, 2016 FINDINGS: There is a moderate amount of gas within small and large bowel loops but the pattern does not appear obstructive. There is no free extraluminal gas observed. There is a nasogastric tube in place whose tip lies in the region of the gastric fundus. A double pigtail stent is in place on the left. There is a right femoral venous catheter with the tip projecting at the L4-5 level on the right. The bony structures exhibit no acute abnormalities. IMPRESSION: Moderate amount of small and large bowel gas without evidence of obstruction. Double pigtail stent in reasonable position on the left. Right femoral venous catheter in reasonable position.  Electronically Signed   By: David  Martinique M.D.   On: 01/22/2016 13:05     Medications:   . amiodarone 30 mg/hr (01/23/16 0445)  . dextrose    . fentaNYL infusion INTRAVENOUS 175 mcg/hr (01/22/16 2102)  . norepinephrine (LEVOPHED) Adult infusion Stopped (01/21/16 0955)  . pureflow 2,500 mL/hr at 01/23/16 0448   . aspirin  81 mg Oral Daily  . atorvastatin  80 mg Oral QPM  . B-complex with vitamin C  1 tablet Oral Daily  . cefTRIAXone  2 g Intravenous Q24H  . chlorhexidine gluconate (MEDLINE KIT)  15 mL Mouth Rinse BID  . cholecalciferol  1,000 Units Oral Daily  . famotidine (PEPCID) IV  20 mg Intravenous Q48H  . feeding supplement (VITAL HIGH PROTEIN)  1,000 mL Per Tube Q24H  . heparin subcutaneous  5,000 Units Subcutaneous Q8H  . insulin aspart  2-6 Units Subcutaneous Q4H  . insulin aspart  6 Units Subcutaneous Q4H  . insulin glargine  35 Units Subcutaneous Q24H  . ipratropium-albuterol  3 mL Nebulization Q6H  . mouth rinse  15 mL Mouth Rinse QID  . potassium chloride  20 mEq Oral Daily  . sodium chloride flush  10-40 mL Intracatheter Q12H   acetaminophen, bisacodyl, dextrose, fentaNYL, heparin, ipratropium-albuterol, metoprolol, midazolam, ondansetron **OR** ondansetron (ZOFRAN) IV, sennosides, sodium chloride flush, traMADol, vecuronium  Assessment/ Plan:  75 y.o. Caucasian female with medical problems of diabetes, hypertension, atrial fibrillation, was admitted on 12/31/2015 with sepsis from urinary source and bilateral pneumonia.   1. Acute renal  failure, likely ATN from concurrent illness 2. Bilateral pneumonia 3. Acute respiratory failure requiring ventilator support 4. Sepsis. Escherichia coli in blood and urine. Hypotension requiring pressors 5. Hypocalcemia, low albumin noted 6. 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 Creatinine baseline 1.1 from October 2017. GFR 49 7. Acidosis, mixed respiratory and metabolic 8. Left hydronephrosis from 6  mm stone. Urgent stent placement early morning 11/26  Plan:  Patient demonstrated good ultrafiltration on CRRT over the preceding 24 hours. At this point in time we will continue the patient on CRRT. As before she remains critically ill and has not yet weaned from the ventilator. We will continue ultrafiltration to help assist in the weaning process.  Electrolytes currently acceptable. Potassium is 4.6 with a serum bicarbonate of 29. We will continue to monitor serum electro lites while the patient remains on continuous renal replacement therapy.   LOS: 8 Starnisha Batrez 12/1/20177:27 AM

## 2016-01-23 NOTE — Progress Notes (Signed)
Chaplain rounded the unit to provide a compassionate presence and support to the patient. Patient appeared to be sleeping.  No visitors were at the bedside. Chaplain provided silent prayer. Heidi Villegas [phone]

## 2016-01-23 NOTE — Progress Notes (Signed)
Pharmacy Antibiotic Note/ Electrolyte Monitoring/ CRRT Medication Adjustment  Heidi Villegas is a 75 y.o. female admitted on 12/29/2015 with sepsis.  Pharmacy has been consulted for ceftriaxone dosing.  Pharmacy also consulted to assist in electrolyte monitoring and to adjust medications based on CRRT. Pt started CRRT 11/28 and appears to be doing better 11/29  Plan: 1. Antibiotics Continue ceftriaxone 2 g IV q 24 hours for E.coli bacteremia. Stop date to total 14 days of abx therapy.  2. Electrolytes 11/30 Phos = 2.4 Gave kphos neutral tablet 500 mg q4 hours x 2 doses. Will f/u AM labs and continue to replace per protocol  3. CRRT No medication adjustments needed at this time. Per rounds, pt will continue on CRRT for now. Will continue to monitor.   Height: 5\' 1"  (154.9 cm) Weight: 226 lb 3.1 oz (102.6 kg) IBW/kg (Calculated) : 47.8  Temp (24hrs), Avg:97.9 F (36.6 C), Min:97.5 F (36.4 C), Max:98.8 F (37.1 C)   Recent Labs Lab 01/16/16 2348  01/17/16 0751 01/17/16 1651 12/30/2015 0426  01/20/16 0501  01/21/16 0630  01/22/16 0630  01/22/16 1828 01/22/16 2338 01/23/16 0226 01/23/16 0405 01/23/16 1109  WBC  --   < >  --   --  49.3*  --  25.1*  --  24.6*  --  25.3*  --   --   --   --  22.4*  --   CREATININE 3.05*  < >  --   --  3.49*  < > 2.81*  < > 1.73*  < > 0.89  0.94  < > 0.83 0.78 0.82 0.82 0.73  LATICACIDVEN 1.7  --  1.6 0.9  --   --   --   --   --   --   --   --   --   --   --   --   --   < > = values in this interval not displayed.  Estimated Creatinine Clearance: 68.9 mL/min (by C-G formula based on SCr of 0.73 mg/dL).    Allergies  Allergen Reactions  . Codeine Nausea And Vomiting  . Sulfa Antibiotics     Antimicrobials this admission: Piperacillin/tazobactam 11/23 >> 11/24 Vancomycin 11/23 >> 11/24, 11/25 >> 11/26 Meropenem 11/24 >> 11/27 Ceftriaxone 11/27 >>  Microbiology results: 11/23 BCx: E coli 2/2, staph 1/2, S ceftriaxone 11/23 UCx: E  coli resistant to ampicillin and amp/sulbactam 11/23 Sputum: pending  11/23 MRSA PCR: negative  Thank you for allowing pharmacy to be a part of this patient's care.  Darrow Bussing, PharmD Pharmacy Resident 01/23/2016 12:44 PM

## 2016-01-23 NOTE — Progress Notes (Signed)
PULMONARY / CRITICAL CARE MEDICINE   Name: CHRISTYNA FENDERSON MRN: [id number] DOB: Mar 28, 1940    ADMISSION DATE:  01/08/2016    CONSULTATION DATE:  01/16/16  REFERRING MD: Dr. Estanislado Pandy  CHIEF COMPLAINT:  Respiratory distress  HISTORY OF PRESENT ILLNESS:   Shirley Baptista is a 75 yo female with PMH significant for Arthritis, Hypertension and DM.  Patient [facility] ON 11/23 with severe sepsis, elevated lactic acid and altered mental status. On 11/24 patient was  Was noted to be severe respiratory distress along with tachycardia and hypertension.  PCCM team was consulted for further management.  Discussion 75 y/o caucasian female presenting with urosepsis, septic shock and acute hypoxic respiratory failure necessitating intubation. CT abdomen 11/25 shows obstructing left renal calculi and hydronephrosis. Taken to the OR 11/26 for a cystoscopy with left retrogate pyelogram, ureteral stent and foley placement. Severe hypercarbia post op.     REVIEW OF SYSTEMS: Unable to assess pt intubated   SUBJECTIVE:  Patient continues to be on CRRT, on vasopressors On high fio2 and PEEP Remains critically ill  fio2 50% PEEP 15    VITAL SIGNS: BP 100/60   Pulse (!) 107   Temp 97.6 F (36.4 C) (Oral)   Resp (!) 30   Ht 5\' 1"  (1.549 m)   Wt 110.2 kg (242 lb 15.2 oz)   SpO2 100%   BMI 45.90 kg/m   HEMODYNAMICS: CVP:  [6 mmHg] 6 mmHg  VENTILATOR SETTINGS: Vent Mode: PRVC FiO2 (%):  [80 %-100 %] 80 % Set Rate:  [30 bmp] 30 bmp Vt Set:  [500 mL] 500 mL PEEP:  [15 cmH20] 15 cmH20  INTAKE / OUTPUT: I/O last 3 completed shifts: In: 4642.4 [I.V.:2662.4; Other:120; NG/GT:1860] Out: [id number] [Urine:355; JK:[id number]; Stool:75]  PHYSICAL EXAMINATION: General:  Acutely ill Caucasian female, well nourished HEENT:  Atraumatic, normocephalic, no discharge, no JVD appreciated Cardiovascular: irregularly irregular, no M/R/G noted, 2+ generalized edema Lungs: Diminished bibasilar, RRR even,  non labored Abdomen:  Soft, nontender, active bowel sounds, flexiseal in place Musculoskeletal:  No inflammation/deformity noted Skin:  Grossly intact GCS<8T   STUDIES:  11/23 Renal ultrasound>>Mild to moderate left hydronephrosis without obstructing cause identified, right renal mass.  11/25 2-D echo>>EF 40 to 45% 11/25 CT Abd Pelvis>>Bilateral mild perinephric stranding. There is mild to moderate left hydronephrosis. Axial image 43 there is 6 mm calcified obstructive calculus in proximal left ureter at the level of upper endplate of L4 vertebral body. There is a Foley catheter within decompressed urinary bladder. No small bowel obstruction.  Few diverticula are noted descending colon. No evidence of acute colitis or diverticulitis. Status post hysterectomy. Degenerative changes thoracolumbar spine. There is about 4 mm anterolisthesis L5 on S1 vertebral body. Bilateral lower lobe posterior consolidation with air bronchogram highly suspicious for pneumonia.  SIGNIFICANT EVENTS: 11/23-Pt admitted with severe sepsis secondary to pylonephritis 11/24-Pt iin severe respiratory distress , requiring BiPAP 11/25-Pt failed Bipap requiring mechanical intubation 11/26- Dialysis per nephro 11/30-remains on CRRT 12/1 on CRRT fio2 50%, PEEP 15  LINES/TUBES: 01/16/16 Right IJ>> 11/25 ETT>> Right femoral dual lumen HD catheter 11/26>>    ASSESSMENT / PLAN:  75 y/o caucasian female presenting with urosepsis, septic shock and acute hypoxic respiratory failure necessitating intubation. CT abdomen 11/25 shows obstructing left renal calculi and hydronephrosis. Taken to the OR 11/26 for a cystoscopy with left retrogate pyelogram, ureteral stent and foley placement. Severe hypercarbia post op.   Patient with multiorgan failure, critically ill with e coli bacteremia  PULMONARY  A: Acute hypoxic respiratory failure possibly related to  septic shock-Failed BiPAP and emergently intubated 11/25 due to  persistent hypoxemia, tachypnea and tachycardia with worsening mental status-severe hypercarbia post op Mix Metabolic and respiratory  acidosis.  P:   Full vent support; settings adjusted with increased PEEP 15 wean as tolerated Repeat ABG Routine CXR Continue Nebulized bronchodilators. VAP Protocol  CARDIOVASCULAR A:  Septic shock secondary to urosepsis-improving Tachycardia-improving Elevated troponin likely demand ischemia secondary to acute respiratory failure and septic shock New onset Afib with RVR P:  Continuous telemetry Continue amlodipine Continue Amiodarone gtt Cardiology consulted appreciate input Levophed gtt as needed to maintain map >65  RENAL A:   AKI likely secondary to sepsis-improving Left hydronephrosis with obstructing renal calculi on CT S/P STAT cystoscopy with stent placement 01/17/2016  Right renal mass-not apparent on CT abdomen P:   Follow BMET Avoid nephrotoxic drugs Replace electrolytes per ICU protocol Urology consulted appreciate input Nephrology consulted appreciate input On CRRT   GASTROINTESTINAL A:   Diarrhea Abdominal distension concerning for?illeus/bowel ischemia P:   Continue tube feedings Orogastric tube care  Tube feeds as tolerated Protonix for GI prophylaxis   HEMATOLOGIC A:   Anemia P:  Heparin for VTE prophylaxis Trend CBC's Transfuse for hgb <7 Monitor for s/sx of bleeding  INFECTIOUS A:   Urosepsis with septic shock-worsening leukocytosis; blood cultures positive for Escherichia coli, Enterobacteriaceae species, and Staphylococcus species in both aerobic and anaerobic bottles. Final cultures pending. Urine culture positive for Escherichia coli with a colony count greater than 100,000 Leukocytosis with WBC=49 Hypothermia-resolved P:   Trend WBC's and monitor fever curve Continue Ceftriaxone  Follow cultures  CULTURES: 11/23 BC; GNR; PCR- enterobacter, Ecoli in both aerobic and anaerobic bottles  11/23 UC;  Escherichia coli 11/24 Flu negative MRSA PCR 11/23 negative.   ANTIBIOTICS: 11/23 Vancomycin>>11/24 11/23 Zosyn>>11/23 11/25 Unasyn x 1dose 11/24 Meropenem>>11/27 11/27 Ceftriaxone>>  ENDOCRINE A:   Diabetes Melitus P:   Insulin gtt CBG's q1hrs  NEUROLOGIC A:   Acute metabolic encephalopathy likely secondary to sepsis P:   RASS score 0 to -1 Fentanyl and  versedto maintain RASS goal WUA daily Lights on during the day     I have personally obtained a history, examined the patient, evaluated Pertinent laboratory and RadioGraphic/imaging results, and  formulated the assessment and plan   The Patient requires high complexity decision making for assessment and support, frequent evaluation and titration of therapies, application of advanced monitoring technologies and extensive interpretation of multiple databases. Critical Care Time devoted to patient care services described in this note is 35 minutes.   Overall, patient is critically ill, prognosis is guarded.  Patient with Multiorgan failure and at high risk for cardiac arrest and death.    Corrin Parker, M.D.  [facility]  Medical Director [facility] Director [facility] Cardio-Pulmonary Department

## 2016-01-23 NOTE — Progress Notes (Signed)
Nutrition Follow-up  DOCUMENTATION CODES:   Obesity unspecified  INTERVENTION:  -Recommend changing to Vital AF 1.2 at goal of 45 ml/hr with Prostat BID providing 111 g protein, 1496 kcals and 875 mL of free water. Continue to assess  NUTRITION DIAGNOSIS:   Inadequate oral intake related to inability to eat as evidenced by NPO status.  Being addressed via TF  GOAL:   Provide needs based on ASPEN/SCCM guidelines  MONITOR:   Vent status, Labs, Weight trends, TF tolerance, I & O's  REASON FOR ASSESSMENT:   Consult, Ventilator Enteral/tube feeding initiation and management  ASSESSMENT:   75 y/o caucasian female presenting with urosepsis, septic shock and acute hypoxic respiratory failure necessitating intubation. CT abdomen 11/25 shows obstructing left renal calculi and hydronephrosis   Pt remains on vent support, on CRRT, fentanyl for sedation  Tolerating Vital High Protein at rate of 60 ml/hr Rectal tube in place with foul-smelling watery diarrhea, 150 mL in bag  Labs: phosphorus 2.1 Meds: reviewed  Diet Order:  Diet NPO time specified  Skin:  Reviewed, no issues  Last BM:  11/27  Height:   Ht Readings from Last 1 Encounters:  01/16/2016 5\' 1"  (1.549 m)    Weight:   Wt Readings from Last 1 Encounters:  01/23/16 226 lb 3.1 oz (102.6 kg)    Ideal Body Weight:  47.72 kg  BMI:  Body mass index is 42.74 kg/m.  Estimated Nutritional Needs:   Kcal:  IV:[id number] calories  Protein:  96-120 g  Fluid:  Per MD/NP/PA  EDUCATION NEEDS:   No education needs identified at this time  [name] [place], [place], [place] [phone] Pager  [phone] Weekend/On-Call Pager

## 2016-01-23 NOTE — Progress Notes (Addendum)
RN made Yevonne Aline., NP aware of increased stool and increased maroon in color coming from flexi seal.  NP at bedside and assessed stool color and amount.  RN also made NP aware that foley is requiring irrigation and has blood in urine.  NP spoke with Dr. Mortimer Fries and gave order to keep flexi seal in place and to obtain cdiff speciman to check stool.  NP ordering labs and protonix drip.

## 2016-01-23 NOTE — Progress Notes (Signed)
Discussed with Dr. Mortimer Fries during rounds that patient is having maroon/brown stool coming from flexi seal.  Thus far during shift flexi seal has put out 150 cc.  Dr. Mortimer Fries stated to leave flexi seal in for now. No further orders at this time.

## 2016-01-23 DEATH — deceased

## 2016-01-24 LAB — RENAL FUNCTION PANEL
ALBUMIN: 1.2 g/dL — AB (ref 3.5–5.0)
ALBUMIN: 1.1 g/dL — AB (ref 3.5–5.0)
ALBUMIN: 1.3 g/dL — AB (ref 3.5–5.0)
ANION GAP: 7 (ref 5–15)
Albumin: <1 g/dL — ABNORMAL LOW (ref 3.5–5.0)
Albumin: 1.2 g/dL — ABNORMAL LOW (ref 3.5–5.0)
Anion gap: 2 — ABNORMAL LOW (ref 5–15)
Anion gap: 6 (ref 5–15)
Anion gap: 5 (ref 5–15)
Anion gap: 6 (ref 5–15)
BUN: 19 mg/dL (ref 6–20)
BUN: 27 mg/dL — AB (ref 6–20)
BUN: 27 mg/dL — AB (ref 6–20)
BUN: 28 mg/dL — AB (ref 6–20)
BUN: 28 mg/dL — ABNORMAL HIGH (ref 6–20)
CALCIUM: 6.2 mg/dL — AB (ref 8.9–10.3)
CALCIUM: 7.9 mg/dL — AB (ref 8.9–10.3)
CHLORIDE: 117 mmol/L — AB (ref 101–111)
CHLORIDE: 106 mmol/L (ref 101–111)
CHLORIDE: 106 mmol/L (ref 101–111)
CO2: 22 mmol/L (ref 22–32)
CO2: 26 mmol/L (ref 22–32)
CO2: 26 mmol/L (ref 22–32)
CO2: 27 mmol/L (ref 22–32)
CO2: 26 mmol/L (ref 22–32)
CREATININE: 0.48 mg/dL (ref 0.44–1.00)
CREATININE: 0.77 mg/dL (ref 0.44–1.00)
CREATININE: 0.76 mg/dL (ref 0.44–1.00)
CREATININE: 0.77 mg/dL (ref 0.44–1.00)
Calcium: 7.9 mg/dL — ABNORMAL LOW (ref 8.9–10.3)
Calcium: 8.1 mg/dL — ABNORMAL LOW (ref 8.9–10.3)
Calcium: 8.2 mg/dL — ABNORMAL LOW (ref 8.9–10.3)
Chloride: 107 mmol/L (ref 101–111)
Chloride: 107 mmol/L (ref 101–111)
Creatinine, Ser: 0.79 mg/dL (ref 0.44–1.00)
GFR calc Af Amer: >60 mL/min (ref >60)
GFR calc Af Amer: >60 mL/min (ref >60)
GFR calc non Af Amer: >60 mL/min (ref >60)
GFR calc non Af Amer: >60 mL/min (ref >60)
GFR calc non Af Amer: >60 mL/min (ref >60)
GLUCOSE: 122 mg/dL — AB (ref 65–99)
GLUCOSE: 126 mg/dL — AB (ref 65–99)
GLUCOSE: 134 mg/dL — AB (ref 65–99)
GLUCOSE: 131 mg/dL — AB (ref 65–99)
Glucose, Bld: 130 mg/dL — ABNORMAL HIGH (ref 65–99)
PHOSPHORUS: 2.7 mg/dL (ref 2.5–4.6)
PHOSPHORUS: 2.8 mg/dL (ref 2.5–4.6)
PHOSPHORUS: 3 mg/dL (ref 2.5–4.6)
POTASSIUM: 5.2 mmol/L — AB (ref 3.5–5.1)
POTASSIUM: 5 mmol/L (ref 3.5–5.1)
Phosphorus: 1.7 mg/dL — ABNORMAL LOW (ref 2.5–4.6)
Phosphorus: 2.9 mg/dL (ref 2.5–4.6)
Potassium: 3.7 mmol/L (ref 3.5–5.1)
Potassium: 5.3 mmol/L — ABNORMAL HIGH (ref 3.5–5.1)
Potassium: 5.1 mmol/L (ref 3.5–5.1)
SODIUM: 141 mmol/L (ref 135–145)
SODIUM: 138 mmol/L (ref 135–145)
Sodium: 139 mmol/L (ref 135–145)
Sodium: 139 mmol/L (ref 135–145)
Sodium: 139 mmol/L (ref 135–145)

## 2016-01-24 LAB — GLUCOSE, CAPILLARY
GLUCOSE-CAPILLARY: 104 mg/dL — AB (ref 65–99)
GLUCOSE-CAPILLARY: 109 mg/dL — AB (ref 65–99)
GLUCOSE-CAPILLARY: 117 mg/dL — AB (ref 65–99)
GLUCOSE-CAPILLARY: 121 mg/dL — AB (ref 65–99)
Glucose-Capillary: 109 mg/dL — ABNORMAL HIGH (ref 65–99)
Glucose-Capillary: 113 mg/dL — ABNORMAL HIGH (ref 65–99)
Glucose-Capillary: 122 mg/dL — ABNORMAL HIGH (ref 65–99)
Glucose-Capillary: 124 mg/dL — ABNORMAL HIGH (ref 65–99)

## 2016-01-24 LAB — PREPARE RBC (CROSSMATCH)

## 2016-01-24 LAB — CBC WITH DIFFERENTIAL/PLATELET
Basophils Absolute: 0 10*3/uL (ref 0–0.1)
Basophils Relative: 0 %
EOS PCT: 0 %
Eosinophils Absolute: 0 10*3/uL (ref 0–0.7)
HCT: 30.9 % — ABNORMAL LOW (ref 35.0–47.0)
HEMOGLOBIN: 10.4 g/dL — AB (ref 12.0–16.0)
LYMPHS ABS: 0.9 10*3/uL — AB (ref 1.0–3.6)
LYMPHS PCT: 3 %
MCH: 27.9 pg (ref 26.0–34.0)
MCHC: 33.7 g/dL (ref 32.0–36.0)
MCV: 83 fL (ref 80.0–100.0)
MONOS PCT: 2 %
Monocytes Absolute: 0.7 10*3/uL (ref 0.2–0.9)
Neutro Abs: 27.1 10*3/uL — ABNORMAL HIGH (ref 1.4–6.5)
Neutrophils Relative %: 95 %
Platelets: 105 10*3/uL — ABNORMAL LOW (ref 150–440)
RBC: 3.73 MIL/uL — AB (ref 3.80–5.20)
RDW: 15.5 % — ABNORMAL HIGH (ref 11.5–14.5)
WBC: 28.7 10*3/uL — AB (ref 3.6–11.0)

## 2016-01-24 LAB — PHOSPHORUS: Phosphorus: 2.4 mg/dL — ABNORMAL LOW (ref 2.5–4.6)

## 2016-01-24 LAB — MAGNESIUM: Magnesium: 1.7 mg/dL (ref 1.7–2.4)

## 2016-01-24 LAB — ABO/RH: ABO/RH(D): B POS

## 2016-01-24 MED ORDER — K PHOS MONO-SOD PHOS DI & MONO 155-852-130 MG PO TABS
500.0000 mg | ORAL_TABLET | ORAL | Status: AC
  Administered 2016-01-24 (×3): 500 mg via ORAL
  Filled 2016-01-24 (×3): qty 2

## 2016-01-24 NOTE — Progress Notes (Signed)
[facility]  ROUNDING NOTE   Subjective:  Patient remains critically ill at this point in time. Oliguria reportedly persist with urine output of 85 cc over the preceding 24 hours. Good ultrafiltration of 2.3 L noted yesterday. Was slightly net negative yesterday.  Objective:  Vital signs in last 24 hours:  Temp:  [92.3 F (33.5 C)-97.8 F (36.6 C)] 96.6 F (35.9 C) (12/02 0815) Pulse Rate:  [65-118] 72 (12/02 1100) Resp:  [21-34] 30 (12/02 1100) BP: (74-133)/(34-80) 113/60 (12/02 1100) SpO2:  [74 %-93 %] 92 % (12/02 1100) FiO2 (%):  [40 %] 40 % (12/02 0825) Weight:  [109.8 kg (242 lb 1 oz)] 109.8 kg (242 lb 1 oz) (12/02 0500)  Weight change:  Filed Weights   01/22/16 0500 01/23/16 0718 01/24/16 0500  Weight: 105.3 kg (232 lb 2.3 oz) 102.6 kg (226 lb 3.1 oz) 109.8 kg (242 lb 1 oz)    Intake/Output: I/O last 3 completed shifts: In: 3260.9 [I.V.:888.9; Blood:350; Other:140; NG/GT:1732; IV Piggyback:150] Out: 8309 [Urine:135; MMHWK:0881; Stool:500]   Intake/Output this shift:  Total I/O In: 330 [Blood:330] Out: 100 [Other:100]  Physical Exam: General: Critically ill appearing   Head: Endotracheal tube and OG in place.   Eyes: Eyes closed   Neck: Supple, trachea midline  Lungs:  Scattered rhonchi bilateral, vent assisted   Heart: S1S2 irregular  Abdomen:  Distended, bowel sounds present   Extremities: 1+ peripheral edema.  Neurologic: Arousable, currently not following commands  Skin: No lesions  Access: Right femoral temporary dialysis catheter     Basic Metabolic Panel:  Recent Labs Lab 12/30/2015 0426  01/19/16 0419  01/23/16 1109 01/23/16 1428 01/23/16 1740 01/23/16 2305 01/24/16 0102 01/24/16 0540  NA 127*  < > 129*  < > 138 137 139 138 141  --   K 4.2  < > 3.4*  < > 4.4 4.4 4.3 4.4 3.7  --   CL 91*  < > 94*  < > 106 105 106 109 117*  --   CO2 23  < > 24  < > 27 28 28 26 22   --   GLUCOSE 326*  < > 288*  < > 159* 159* 142* 148* 122*  --    BUN 99*  < > 70*  < > 24* 24* 23* 24* 19  --   CREATININE 3.49*  < > 2.89*  < > 0.73 0.73 0.70 0.62 0.48  --   CALCIUM 6.3*  < > 6.2*  < > 8.0* 8.1* 8.1* 7.4* 6.2*  --   MG 2.4  --  2.1  --   --   --   --   --   --  1.7  PHOS 7.6*  --  4.6  < > 2.1* 2.2* 2.4* 2.3* 1.7* 2.4*  < > = values in this interval not displayed.  Liver Function Tests:  Recent Labs Lab 01/23/16 1109 01/23/16 1428 01/23/16 1740 01/23/16 2305 01/24/16 0102  ALBUMIN 1.1* 1.0* 1.0* 1.0* <1.0*   No results for input(s): LIPASE, AMYLASE in the last 168 hours. No results for input(s): AMMONIA in the last 168 hours.  CBC:  Recent Labs Lab 01/20/16 0501  01/22/16 0630 01/23/16 0405 01/23/16 1740 01/23/16 2305 01/24/16 0902  WBC 25.1*  < > 25.3* 22.4* 23.8* 20.6* 28.7*  NEUTROABS 23.9*  --  24.1* 21.0* 22.1*  --  27.1*  HGB 9.7*  < > 9.0* 8.7* 8.8* 6.3* 10.4*  HCT 29.1*  < > 26.8* 25.5* 26.1*  19.2* 30.9*  MCV 84.3  < > 82.0 82.4 83.9 83.2 83.0  PLT 66*  < > 105* 95* 109* 75* 105*  < > = values in this interval not displayed.  Cardiac Enzymes: No results for input(s): CKTOTAL, CKMB, CKMBINDEX, TROPONINI in the last 168 hours.  BNP: Invalid input(s): POCBNP  CBG:  Recent Labs Lab 01/23/16 1945 01/24/16 0017 01/24/16 0310 01/24/16 0557 01/24/16 0743  GLUCAP 149* 121* 124* 109* 117*    Microbiology: Results for orders placed or performed during the hospital encounter of 12/27/2015  Urine culture     Status: Abnormal   Collection Time: 01/17/2016  1:29 PM  Result Value Ref Range Status   Specimen Description URINE, RANDOM  Final   Special Requests NONE  Final   Culture >=100,000 COLONIES/mL ESCHERICHIA COLI (A)  Final   Report Status 01/17/2016 FINAL  Final   Organism ID, Bacteria ESCHERICHIA COLI (A)  Final      Susceptibility   Escherichia coli - MIC*    AMPICILLIN >=32 RESISTANT Resistant     CEFAZOLIN <=4 SENSITIVE Sensitive     CEFTRIAXONE <=1 SENSITIVE Sensitive     CIPROFLOXACIN  <=0.25 SENSITIVE Sensitive     GENTAMICIN <=1 SENSITIVE Sensitive     IMIPENEM <=0.25 SENSITIVE Sensitive     NITROFURANTOIN <=16 SENSITIVE Sensitive     TRIMETH/SULFA <=20 SENSITIVE Sensitive     AMPICILLIN/SULBACTAM >=32 RESISTANT Resistant     PIP/TAZO <=4 SENSITIVE Sensitive     Extended ESBL NEGATIVE Sensitive     * >=100,000 COLONIES/mL ESCHERICHIA COLI  Culture, blood (routine x 2)     Status: Abnormal   Collection Time: 01/04/2016  1:29 PM  Result Value Ref Range Status   Specimen Description BLOOD RIGHT HAND  Final   Special Requests   Final    BOTTLES DRAWN AEROBIC AND ANAEROBIC 11MLAERO,8MLANA   Culture  Setup Time   Final    GRAM NEGATIVE RODS IN BOTH AEROBIC AND ANAEROBIC BOTTLES CRITICAL RESULT CALLED TO, READ BACK BY AND VERIFIED WITH: NATE COOKSON AT [place] ON 01/16/16 [place]. Performed at [place] (A)  Final   Report Status 01/19/2016 FINAL  Final   Organism ID, Bacteria ESCHERICHIA COLI  Final      Susceptibility   Escherichia coli - MIC*    AMPICILLIN >=32 RESISTANT Resistant     CEFAZOLIN <=4 SENSITIVE Sensitive     CEFEPIME <=1 SENSITIVE Sensitive     CEFTAZIDIME <=1 SENSITIVE Sensitive     CEFTRIAXONE <=1 SENSITIVE Sensitive     CIPROFLOXACIN <=0.25 SENSITIVE Sensitive     GENTAMICIN <=1 SENSITIVE Sensitive     IMIPENEM <=0.25 SENSITIVE Sensitive     TRIMETH/SULFA <=20 SENSITIVE Sensitive     AMPICILLIN/SULBACTAM >=32 RESISTANT Resistant     PIP/TAZO <=4 SENSITIVE Sensitive     Extended ESBL NEGATIVE Sensitive     * ESCHERICHIA COLI  Culture, blood (routine x 2)     Status: Abnormal   Collection Time: 01/07/2016  1:29 PM  Result Value Ref Range Status   Specimen Description BLOOD LEFT ANTECUBITAL  Final   Special Requests   Final    BOTTLES DRAWN AEROBIC AND ANAEROBIC AER 12ML ANA 11ML   Culture  Setup Time   Final    GRAM NEGATIVE RODS IN BOTH AEROBIC AND ANAEROBIC BOTTLES CRITICAL VALUE NOTED.  VALUE IS CONSISTENT  WITH PREVIOUSLY REPORTED AND CALLED VALUE. GRAM POSITIVE COCCI AEROBIC BOTTLE ONLY CRITICAL RESULT  CALLED TO, READ BACK BY AND VERIFIED WITH: Charlane Ferretti @ 2426 01/16/16 by [facility].    Culture (A)  Final    ESCHERICHIA COLI SUSCEPTIBILITIES PERFORMED ON PREVIOUS CULTURE WITHIN THE LAST 5 DAYS. STAPHYLOCOCCUS SPECIES (COAGULASE NEGATIVE) THE SIGNIFICANCE OF ISOLATING THIS ORGANISM FROM A SINGLE SET OF BLOOD CULTURES WHEN MULTIPLE SETS ARE DRAWN IS UNCERTAIN. PLEASE NOTIFY THE MICROBIOLOGY DEPARTMENT WITHIN ONE WEEK IF SPECIATION AND SENSITIVITIES ARE REQUIRED. Performed at [facility]    Report Status 01/06/2016 FINAL  Final  Blood Culture ID Panel (Reflexed)     Status: Abnormal   Collection Time: 01/11/2016  1:29 PM  Result Value Ref Range Status   Enterococcus species NOT DETECTED NOT DETECTED Final   Listeria monocytogenes NOT DETECTED NOT DETECTED Final   Staphylococcus species NOT DETECTED NOT DETECTED Final   Staphylococcus aureus NOT DETECTED NOT DETECTED Final   Streptococcus species NOT DETECTED NOT DETECTED Final   Streptococcus agalactiae NOT DETECTED NOT DETECTED Final   Streptococcus pneumoniae NOT DETECTED NOT DETECTED Final   Streptococcus pyogenes NOT DETECTED NOT DETECTED Final   Acinetobacter baumannii NOT DETECTED NOT DETECTED Final   Enterobacteriaceae species DETECTED (A) NOT DETECTED Final    Comment: CRITICAL RESULT CALLED TO, READ BACK BY AND VERIFIED WITH: NATE COOKSON AT 0556 ON 01/16/16 [place].    Enterobacter cloacae complex NOT DETECTED NOT DETECTED Final   Escherichia coli DETECTED (A) NOT DETECTED Final    Comment: CRITICAL RESULT CALLED TO, READ BACK BY AND VERIFIED WITH: NATE COOKSON AT 8341 ON 01/16/16 [place].    Klebsiella oxytoca NOT DETECTED NOT DETECTED Final   Klebsiella pneumoniae NOT DETECTED NOT DETECTED Final   Proteus species NOT DETECTED NOT DETECTED Final   Serratia marcescens NOT DETECTED NOT DETECTED Final   Carbapenem resistance NOT  DETECTED NOT DETECTED Final   Haemophilus influenzae NOT DETECTED NOT DETECTED Final   Neisseria meningitidis NOT DETECTED NOT DETECTED Final   Pseudomonas aeruginosa NOT DETECTED NOT DETECTED Final   Candida albicans NOT DETECTED NOT DETECTED Final   Candida glabrata NOT DETECTED NOT DETECTED Final   Candida krusei NOT DETECTED NOT DETECTED Final   Candida parapsilosis NOT DETECTED NOT DETECTED Final   Candida tropicalis NOT DETECTED NOT DETECTED Final  Blood Culture ID Panel (Reflexed)     Status: Abnormal   Collection Time: 12/29/2015  1:40 PM  Result Value Ref Range Status   Enterococcus species NOT DETECTED NOT DETECTED Final   Vancomycin resistance NOT DETECTED NOT DETECTED Final   Listeria monocytogenes NOT DETECTED NOT DETECTED Final   Staphylococcus species DETECTED (A) NOT DETECTED Final    Comment: CRITICAL RESULT CALLED TO, READ BACK BY AND VERIFIED WITH: Charlane Ferretti @ 9622 01/16/16 by [facility].    Staphylococcus aureus NOT DETECTED NOT DETECTED Final   Methicillin resistance NOT DETECTED NOT DETECTED Final   Streptococcus species NOT DETECTED NOT DETECTED Final   Streptococcus agalactiae NOT DETECTED NOT DETECTED Final   Streptococcus pneumoniae NOT DETECTED NOT DETECTED Final   Streptococcus pyogenes NOT DETECTED NOT DETECTED Final   Acinetobacter baumannii NOT DETECTED NOT DETECTED Final   Enterobacteriaceae species DETECTED (A) NOT DETECTED Final    Comment: CRITICAL RESULT CALLED TO, READ BACK BY AND VERIFIED WITH: Charlane Ferretti @ 2979 01/16/16 by [facility].    Enterobacter cloacae complex NOT DETECTED NOT DETECTED Final   Escherichia coli DETECTED (A) NOT DETECTED Final    Comment: CRITICAL RESULT CALLED TO, READ BACK BY AND VERIFIED WITH: Charlane Ferretti @  1137 01/16/16 by [place].    Klebsiella oxytoca NOT DETECTED NOT DETECTED Final   Klebsiella pneumoniae NOT DETECTED NOT DETECTED Final   Proteus species NOT DETECTED NOT DETECTED Final   Serratia marcescens NOT DETECTED NOT  DETECTED Final   Carbapenem resistance NOT DETECTED NOT DETECTED Final   Haemophilus influenzae NOT DETECTED NOT DETECTED Final   Neisseria meningitidis NOT DETECTED NOT DETECTED Final   Pseudomonas aeruginosa NOT DETECTED NOT DETECTED Final   Candida albicans NOT DETECTED NOT DETECTED Final   Candida glabrata NOT DETECTED NOT DETECTED Final   Candida krusei NOT DETECTED NOT DETECTED Final   Candida parapsilosis NOT DETECTED NOT DETECTED Final   Candida tropicalis NOT DETECTED NOT DETECTED Final  MRSA PCR Screening     Status: None   Collection Time: 12/31/2015  7:00 PM  Result Value Ref Range Status   MRSA by PCR NEGATIVE NEGATIVE Final    Comment:        The GeneXpert MRSA Assay (FDA approved for NASAL specimens only), is one component of a comprehensive MRSA colonization surveillance program. It is not intended to diagnose MRSA infection nor to guide or monitor treatment for MRSA infections.   C difficile quick scan w PCR reflex     Status: None   Collection Time: 01/23/16  5:18 PM  Result Value Ref Range Status   C Diff antigen NEGATIVE NEGATIVE Final   C Diff toxin NEGATIVE NEGATIVE Final   C Diff interpretation No C. difficile detected.  Final    Coagulation Studies:  Recent Labs  01/23/16 1740  LABPROT 16.1*  INR 1.28    Urinalysis: No results for input(s): COLORURINE, LABSPEC, PHURINE, GLUCOSEU, HGBUR, BILIRUBINUR, KETONESUR, PROTEINUR, UROBILINOGEN, NITRITE, LEUKOCYTESUR in the last 72 hours.  Invalid input(s): APPERANCEUR    Imaging: Dg Abd 1 View  Result Date: 01/22/2016 CLINICAL DATA:  Morbid obesity, abdominal distension. EXAM: ABDOMEN - 1 VIEW COMPARISON:  Abdominal radiographs of January 18, 2016 FINDINGS: There is a moderate amount of gas within small and large bowel loops but the pattern does not appear obstructive. There is no free extraluminal gas observed. There is a nasogastric tube in place whose tip lies in the region of the gastric fundus. A  double pigtail stent is in place on the left. There is a right femoral venous catheter with the tip projecting at the L4-5 level on the right. The bony structures exhibit no acute abnormalities. IMPRESSION: Moderate amount of small and large bowel gas without evidence of obstruction. Double pigtail stent in reasonable position on the left. Right femoral venous catheter in reasonable position. Electronically Signed   By: David  Martinique M.D.   On: 01/22/2016 13:05     Medications:   . dextrose    . fentaNYL infusion INTRAVENOUS 250 mcg/hr (01/24/16 0745)  . norepinephrine (LEVOPHED) Adult infusion 1 mcg/min (01/24/16 1657)  . pantoprozole (PROTONIX) infusion 8 mg/hr (01/24/16 0600)  . pureflow 2,500 mL/hr at 01/24/16 0259   . aspirin  81 mg Oral Daily  . atorvastatin  80 mg Oral QPM  . B-complex with vitamin C  1 tablet Oral Daily  . cefTRIAXone  2 g Intravenous Q24H  . chlorhexidine gluconate (MEDLINE KIT)  15 mL Mouth Rinse BID  . cholecalciferol  1,000 Units Oral Daily  . insulin aspart  2-6 Units Subcutaneous Q4H  . insulin glargine  17 Units Subcutaneous Q24H  . ipratropium-albuterol  3 mL Nebulization Q6H  . mouth rinse  15 mL Mouth Rinse QID  .  methylPREDNISolone (SOLU-MEDROL) injection  40 mg Intravenous Q12H  . phosphorus  500 mg Oral Q4H  . potassium chloride  20 mEq Oral Daily  . sodium chloride flush  10-40 mL Intracatheter Q12H   acetaminophen, bisacodyl, dextrose, fentaNYL, heparin, ipratropium-albuterol, metoprolol, midazolam, ondansetron **OR** ondansetron (ZOFRAN) IV, sennosides, sodium chloride flush, traMADol, vecuronium  Assessment/ Plan:  75 y.o. Caucasian female with medical problems of diabetes, hypertension, atrial fibrillation, was admitted on 01/02/2016 with sepsis from urinary source and bilateral pneumonia.   1. Acute renal failure, likely ATN from concurrent illness 2. Bilateral pneumonia 3. Acute respiratory failure requiring ventilator support 4.  Sepsis. Escherichia coli in blood and urine. Hypotension requiring pressors 5. Hypocalcemia, low albumin noted 6. 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 Creatinine baseline 1.1 from October 2017. GFR 49 7. Acidosis, mixed respiratory and metabolic 8. Left hydronephrosis from 6 mm stone. Urgent stent placement early morning 11/26  Plan:  Oliguria and critical illness continue to persist unfortunately. Therefore we will continue the patient on CRRT. Increase ultrafiltration target to 125 cc per hour. Continue to monitor renal function panels while the patient remains on CRRT. Electrolytes currently are acceptable. Phosphorus has been periodically low and may require repletion. Otherwise continue supportive care including ventilatory support.   LOS: 9 Kaydince Towles 12/2/201711:28 AM

## 2016-01-24 NOTE — Progress Notes (Signed)
PULMONARY / CRITICAL CARE MEDICINE   Name: Heidi Villegas MRN: [id number] DOB: [birth year]    ADMISSION DATE:  01/18/2016    CONSULTATION DATE:  01/16/16  REFERRING MD: Dr. Estanislado Pandy  CHIEF COMPLAINT:  Respiratory distress  HISTORY OF PRESENT ILLNESS:   Heidi Villegas is a 75 yo female with PMH significant for Arthritis, Hypertension and DM.  Patient presented to [facility] ON 11/23 with severe sepsis, elevated lactic acid and altered mental status. On 11/24 patient was  Was noted to be severe respiratory distress along with tachycardia and hypertension.  PCCM team was consulted for further management.  SUBJECTIVE:  Patient continues to be on CRRT and pressors for mean arterial pressure less than 65 On high fio2 and PEEP Remains critically ill fio2 50% PEEP 15  Acute GI bleed noted at about 7 PM 01/23/2016. Hemoglobin dropped from 8.8 g to 6.3 g. 2 units of packed red blood cells ordered, GI consulted and recommended blood transfusion, IV Protonix, and discontinuation of rectal tube. Rectal tube removed and patient is currently receiving second unit of packed red blood cells.  VITAL SIGNS: BP 100/60   Pulse (!) 107   Temp 97.6 F (36.4 C) (Oral)   Resp (!) 30   Ht 5\' 1"  (1.549 m)   Wt 110.2 kg (242 lb 15.2 oz)   SpO2 100%   BMI 45.90 kg/m   HEMODYNAMICS: CVP:  [6 mmHg] 6 mmHg  VENTILATOR SETTINGS: Vent Mode: PRVC FiO2 (%):  [80 %-100 %] 80 % Set Rate:  [30 bmp] 30 bmp Vt Set:  [500 mL] 500 mL PEEP:  [15 cmH20] 15 cmH20  INTAKE / OUTPUT: I/O last 3 completed shifts: In: 4642.4 [I.V.:2662.4; Other:120; NG/GT:1860] Out: [id number] [Urine:355; BA:633978; Stool:75]  PHYSICAL EXAMINATION: General:  Acutely ill Caucasian female, well nourished HEENT:  Atraumatic, normocephalic, no discharge, no JVD appreciated Cardiovascular: irregularly irregular, no M/R/G noted, 2+ generalized edema Lungs: Diminished bibasilar, RRR even, non labored Abdomen:  Soft, nontender, active bowel  sounds, flexiseal in place Musculoskeletal:  No inflammation/deformity noted Skin:  Grossly intact GCS<8T  STUDIES:  11/23 Renal ultrasound>>Mild to moderate left hydronephrosis without obstructing cause identified, right renal mass.  11/25 2-D echo>>EF 40 to 45% 11/25 CT Abd Pelvis>>Bilateral mild perinephric stranding. There is mild to moderate left hydronephrosis. Axial image 43 there is 6 mm calcified obstructive calculus in proximal left ureter at the level of upper endplate of L4 vertebral body. There is a Foley catheter within decompressed urinary bladder. No small bowel obstruction.  Few diverticula are noted descending colon. No evidence of acute colitis or diverticulitis. Status post hysterectomy. Degenerative changes thoracolumbar spine. There is about 4 mm anterolisthesis L5 on S1 vertebral body. Bilateral lower lobe posterior consolidation with air bronchogram highly suspicious for pneumonia.  SIGNIFICANT EVENTS: 11/23-Pt admitted with severe sepsis secondary to pylonephritis 11/24-Pt iin severe respiratory distress , requiring BiPAP 11/25-Pt failed Bipap requiring mechanical intubation 11/26- Dialysis per nephro 11/30-remains on CRRT 12/1 on CRRT fio2 50%, PEEP 15  LINES/TUBES: 01/16/16 Right IJ>> 11/25 ETT>> Right femoral dual lumen HD catheter 11/26>>   ASSESSMENT / PLAN:  75 y/o caucasian female presenting with urosepsis, septic shock and acute hypoxic respiratory failure necessitating intubation. CT abdomen 11/25 shows obstructing left renal calculi and hydronephrosis. Taken to the OR 11/26 for a cystoscopy with left retrogate pyelogram, ureteral stent and foley placement. Severe hypercarbia post op.   Patient with multiorgan failure, critically ill with e coli bacteremia  PULMONARY A: Acute hypoxic respiratory failure possibly  related to  septic shock-Failed BiPAP and emergently intubated 11/25 due to persistent hypoxemia, tachypnea and tachycardia with worsening  mental status-severe hypercarbia post op Mix Metabolic and respiratory  acidosis.  P:   Full vent support; settings adjusted with increased PEEP 15 wean as tolerated Chest x-ray and ABG when necessary Continue Nebulized bronchodilators. VAP Protocol  CARDIOVASCULAR A:  Septic shock secondary to urosepsis-improving Tachycardia-heart rate dropped to the low 30s transiently and improved to the low 60s Elevated troponin likely demand ischemia secondary to acute respiratory failure and septic shock New onset Afib with RVR P:  Continuous telemetry Discontinue Amiodarone gtt Cardiology consulted appreciate input Levophed gtt as needed to maintain map >65 Hold off on all home blood pressure medications in light of hypotension RENAL A:   AKI likely secondary to sepsis-improving Left hydronephrosis with obstructing renal calculi on CT S/P STAT cystoscopy with stent placement 12/28/2015  Right renal mass-not apparent on CT abdomen P:   Follow BMET Avoid nephrotoxic drugs Replace electrolytes per ICU protocol Urology consulted appreciate input Nephrology consulted appreciate input Continue CRRT per nephrology  GASTROINTESTINAL A:   Diarrhea Abdominal distension concerning for?illeus/bowel ischemia Acute GI bleed-likely lower GI bleed from rectal mucosa irritation from due to rectal fecal system P:   Hold tube feeds until bleeding resolves Orogastric tube care  Start Protonix infusion Transfuse packed red blood cells 2 units Monitor for further bleeding  GI consulted-spoke with Dr. Vira Agar  HEMATOLOGIC A:   Anemia-acute blood loss superimposed on anemia of chronic disease  P:  Discontinue Heparin for VTE prophylaxis SCDs for VTE prophylaxis Trend CBC's Transfuse for hgb <7 Monitor for further rectal bleeding  INFECTIOUS A:   Urosepsis with septic shock-worsening leukocytosis; blood cultures positive for Escherichia coli, Enterobacteriaceae species, and Staphylococcus  species in both aerobic and anaerobic bottles. Final cultures pending. Urine culture positive for Escherichia coli with a colony count greater than 100,000 Leukocytosis with WBC=49 Hypothermia-resolved P:   Trend WBC's and monitor fever curve Continue Ceftriaxone  Follow cultures  CULTURES: 11/23 BC; GNR; PCR- enterobacter, Ecoli in both aerobic and anaerobic bottles  11/23 UC; Escherichia coli 11/24 Flu negative MRSA PCR 11/23 negative.   ANTIBIOTICS: 11/23 Vancomycin>>11/24 11/23 Zosyn>>11/23 11/25 Unasyn x 1dose 11/24 Meropenem>>11/27 11/27 Ceftriaxone>>  ENDOCRINE A:   Diabetes Melitus P:   Insulin gtt CBG's q1hrs  NEUROLOGIC A:   Acute metabolic encephalopathy likely secondary to sepsis P:   RASS score 0 to -1 Fentanyl and  Versed to maintain RASS goal WUA daily Lights on during the day   Disposition and family update: No family at bedside. Husband updated on current GI bleed and  need for transfusion  I have personally obtained a history, examined the patient, evaluated Pertinent laboratory and RadioGraphic/imaging results, and  formulated the assessment and plan   The Patient requires high complexity decision making for assessment and support, frequent evaluation and titration of therapies, application of advanced monitoring technologies and extensive interpretation of multiple databases. Critical Care Time devoted to patient care services described in this note is 45 minutes.   Overall, patient is critically ill, prognosis is guarded.  Patient with Multiorgan failure and at high risk for cardiac arrest and death.  Case discussed with Burman Nieves NP  Corrin Parker, M.D.  [facility]  Medical Director [place] Director [place] Department

## 2016-01-24 NOTE — Progress Notes (Signed)
Pharmacy Antibiotic Note/ Electrolyte Monitoring/ CRRT Medication Adjustment  Heidi Villegas is a 75 y.o. female admitted on 01/17/2016 with sepsis.  Pharmacy has been consulted for ceftriaxone dosing.  Pharmacy also consulted to assist in electrolyte monitoring and to adjust medications based on CRRT. Pt started CRRT 11/28 and appears to be doing better 11/29  Plan: 1. Antibiotics Continue ceftriaxone 2 g IV q 24 hours for E.coli bacteremia. Stop date to total 14 days of abx therapy.  2. Electrolytes 12/2: Phos = 2.4 Ordered KPhos Neutral tablet 500 mg q4 hours x 4 doses. Will f/u AM labs and continue to replace per protocol.  Patient is also taking KCl 46meq PO daily.  3. CRRT No medication adjustments needed at this time. Per rounds, pt will continue on CRRT for now. Will continue to monitor.   Height: 5\' 1"  (154.9 cm) Weight: 242 lb 1 oz (109.8 kg) IBW/kg (Calculated) : 47.8  Temp (24hrs), Avg:96.2 F (35.7 C), Min:92.3 F (33.5 C), Max:97.8 F (36.6 C)   Recent Labs Lab 01/17/16 1651  01/21/16 0630  01/22/16 0630  01/23/16 0405 01/23/16 1109 01/23/16 1428 01/23/16 1740 01/23/16 2305 01/24/16 0102  WBC  --   < > 24.6*  --  25.3*  --  22.4*  --   --  23.8* 20.6*  --   CREATININE  --   < > 1.73*  < > 0.89  0.94  < > 0.82 0.73 0.73 0.70 0.62 0.48  LATICACIDVEN 0.9  --   --   --   --   --   --   --   --   --   --   --   < > = values in this interval not displayed.  Estimated Creatinine Clearance: 71.8 mL/min (by C-G formula based on SCr of 0.48 mg/dL).    Allergies  Allergen Reactions  . Codeine Nausea And Vomiting  . Sulfa Antibiotics     Antimicrobials this admission: Piperacillin/tazobactam 11/23 >> 11/24 Vancomycin 11/23 >> 11/24, 11/25 >> 11/26 Meropenem 11/24 >> 11/27 Ceftriaxone 11/27 >>  Microbiology results: 11/23 BCx: E coli 2/2, staph 1/2, S ceftriaxone 11/23 UCx: E coli resistant to ampicillin and amp/sulbactam 11/23 Sputum: pending  11/23  MRSA PCR: negative  Thank you for allowing pharmacy to be a part of this patient's care.  Olivia Canter [facility] Clinical Pharmacist 01/24/2016 9:07 AM

## 2016-01-24 NOTE — Progress Notes (Signed)
Pt temperature is now 94 F axillary.  Notified Dorene Sorrow NP, order for warming blanket.  Do not place rectal probe per Magadalene as pt has lower GI bleed.  Continue to monitor temperature.

## 2016-01-24 NOTE — Progress Notes (Signed)
Pt remains on vent, lightly sedated, opens to eyes to voice and follows commands at times.  Now NSR on telemetry. Lungs rhonchci.  At start of shift pt was having bloody stools, GI was consulted.  Per GI recommendation, rectal tube was removed, as since that time have not noted any additional GI bleeding.  Pt did have drop in HBG to 6.8, pt has received 1 unit of PRBCs, 2nd unit infusing.  Pt did receive 20 mg lasix b/w units, but pt has remained anuric.  Foley irrigated with no resistance met, and able to withdraw amount instilled, but no urine output.  Pt became hypothermic and was placed on warming blanket, blanket currently off and pt maintaining temp around 97.8  Remains on fentanyl and protonix drips, levophed drip was restarted to maintain map >65, with max dose of 4 mcg, currently weaned down levophed to 1 mcg after receiving blood.

## 2016-01-24 NOTE — Progress Notes (Signed)
CRRT cartridge changed.  CRRT restarted at previous settings. See flowsheet.

## 2016-01-24 NOTE — Consult Note (Signed)
GI Inpatient Consult Note  Reason for Consult:GI bleeding.   Attending Requesting Consult:Dr. Mortimer Fries  History of Present Illness: Heidi Villegas is a 75 y.o. female on a vent due to resp failure along with acute kidney injury/renal failure,community acquired pneumonia, septic shock.  She had onset of rectal bleeding with red blood.  She had a rectal tube in at that time. Consult For bleeding.  Pt got 2 units of blood this morning after hgb came back 6.3.  After the transfusion her hgb came back at 10.4. She had a rectal tube in which was removed after I told her PA that this could be a source of bleeding, as could Upper GI problems like ulcers.  Past Medical History:  Past Medical History:  Diagnosis Date  . Arthritis    right hand, lower back  . Hypertension   . Macular degeneration   . Skin cancer     Problem List: Patient Active Problem List   Diagnosis Date Noted  . Pressure injury of skin 01/23/2016  . Acute respiratory failure with hypoxia ([place])   . AKI (acute kidney injury) ([place])   . Sepsis ([place])   . Urinary tract infection without hematuria   . Community acquired pneumonia of left lung   . Acute renal failure ([place])   . Septic shock ([place]) 01/17/2016    Past Surgical History: Past Surgical History:  Procedure Laterality Date  . ABDOMINAL HYSTERECTOMY    . BREAST BIOPSY    . BROW LIFT Bilateral 07/30/2014   Procedure: BLEPHAROPLASTY;  Surgeon: Karle Starch, MD;  Location: [place];  Service: Ophthalmology;  Laterality: Bilateral;  COSMETIC BILATERAL UPPER AND LOWER LIDS  . COLONOSCOPY    . CYSTOSCOPY WITH STENT PLACEMENT Left 01/07/2016   Procedure: LEFT CYSTOSCOPY, LEFT RETROGRADE, LEFT URETERAL STENT;  Surgeon: Festus Aloe, MD;  Location: [facility];  Service: Urology;  Laterality: Left;  . WISDOM TOOTH EXTRACTION      Allergies: Allergies  Allergen Reactions  . Codeine Nausea And Vomiting  . Sulfa Antibiotics     Home Medications: Prescriptions  Prior to Admission  Medication Sig Dispense Refill Last Dose  . acetaminophen (TYLENOL) 500 MG tablet Take 500 mg by mouth every 6 (six) hours as needed.   prn at prn  . amLODipine (NORVASC) 2.5 MG tablet Take 2.5 mg by mouth daily. AM   01/14/2016 at Unknown time  . aspirin 81 MG tablet Take 81 mg by mouth daily. AM   01/14/2016 at Unknown time  . atorvastatin (LIPITOR) 80 MG tablet Take 1 tablet by mouth daily.   01/14/2016 at Unknown time  . b complex vitamins tablet Take 1 tablet by mouth daily. AM   01/14/2016 at Unknown time  . Cholecalciferol (VITAMIN D-3 PO) Take 1 tablet by mouth daily. AM    01/14/2016 at Unknown time  . CINNAMON PO Take by mouth. AM   01/14/2016 at Unknown time  . metFORMIN (GLUCOPHAGE-XR) 500 MG 24 hr tablet Take 1 tablet by mouth daily.   01/14/2016 at Unknown time  . Multiple Vitamins-Minerals (CENTRUM SILVER PO) Take by mouth. AM   01/14/2016 at Unknown time  . Multiple Vitamins-Minerals (PRESERVISION AREDS PO) Take by mouth. AM   01/14/2016 at Unknown time  . Omega-3 Fatty Acids (OMEGA 3 PO) Take by mouth. AM   01/14/2016 at Unknown time  . potassium chloride SA (K-DUR,KLOR-CON) 20 MEQ tablet Take 1 tablet by mouth daily.   01/14/2016 at Unknown time  . TURMERIC  PO Take by mouth.   01/14/2016 at Unknown time   Home medication reconciliation was completed with the patient.   Scheduled Inpatient Medications:   . aspirin  81 mg Oral Daily  . atorvastatin  80 mg Oral QPM  . B-complex with vitamin C  1 tablet Oral Daily  . cefTRIAXone  2 g Intravenous Q24H  . chlorhexidine gluconate (MEDLINE KIT)  15 mL Mouth Rinse BID  . cholecalciferol  1,000 Units Oral Daily  . insulin aspart  2-6 Units Subcutaneous Q4H  . insulin glargine  17 Units Subcutaneous Q24H  . ipratropium-albuterol  3 mL Nebulization Q6H  . mouth rinse  15 mL Mouth Rinse QID  . methylPREDNISolone (SOLU-MEDROL) injection  40 mg Intravenous Q12H  . phosphorus  500 mg Oral Q4H  . potassium  chloride  20 mEq Oral Daily  . sodium chloride flush  10-40 mL Intracatheter Q12H    Continuous Inpatient Infusions:   . dextrose    . fentaNYL infusion INTRAVENOUS 250 mcg/hr (01/24/16 0745)  . norepinephrine (LEVOPHED) Adult infusion 1 mcg/min (01/24/16 6789)  . pantoprozole (PROTONIX) infusion 8 mg/hr (01/24/16 0600)  . pureflow 2,500 mL/hr at 01/24/16 0259    PRN Inpatient Medications:  acetaminophen, bisacodyl, dextrose, fentaNYL, heparin, ipratropium-albuterol, metoprolol, midazolam, ondansetron **OR** ondansetron (ZOFRAN) IV, sennosides, sodium chloride flush, traMADol, vecuronium  Family History: family history is not on file.  The patient's family history is negative for inflammatory bowel disorders, GI malignancy, or solid organ transplantation.  Social History:   reports that she has never smoked. She has never used smokeless tobacco. She reports that she drinks alcohol. The patient denies ETOH, tobacco, or drug use.   Review of Systems:NOT OBTAINABLE AS PATIENT ON VENTILATOR Constitutional: Weight is stable.  Eyes: No changes in vision. ENT: No oral lesions, sore throat.  GI: see HPI.  Heme/Lymph: No easy bruising.  CV: No chest pain.  GU: No hematuria.  Integumentary: No rashes.  Neuro: No headaches.  Psych: No depression/anxiety.  Endocrine: No heat/cold intolerance.  Allergic/Immunologic: No urticaria.  Resp: No cough, SOB.  Musculoskeletal: No joint swelling.    Physical Examination: BP 121/65   Pulse 71   Temp (!) 96.6 F (35.9 C)   Resp (!) 30   Ht 5' 1"  (1.549 m)   Wt 109.8 kg (242 lb 1 oz)   SpO2 92%   BMI 45.74 kg/m  Gen: Elderly WF on ventilator, skin color looks very good after transfusions. HEENT:, EOMI, Neck Chest: CTA bilaterally, no wheezes, crackles, or other adventitious sounds CV: RRR, no m/g/c/r Abd: soft, NT, ND, +BS in all four quadrants; no HSM, guarding, ridigity, or rebound tenderness Ext: no edema, Skin: no rash or lesions  noted Lymph: no LAD  Data: Lab Results  Component Value Date   WBC 20.6 (H) 01/23/2016   HGB 6.3 (L) 01/23/2016   HCT 19.2 (L) 01/23/2016   MCV 83.2 01/23/2016   PLT 75 (L) 01/23/2016    Recent Labs Lab 01/23/16 0405 01/23/16 1740 01/23/16 2305  HGB 8.7* 8.8* 6.3*   Lab Results  Component Value Date   NA 141 01/24/2016   K 3.7 01/24/2016   CL 117 (H) 01/24/2016   CO2 22 01/24/2016   BUN 19 01/24/2016   CREATININE 0.48 01/24/2016   Lab Results  Component Value Date   ALT 114 (H) 01/16/2016   AST 138 (H) 01/16/2016   ALKPHOS 262 (H) 01/16/2016   BILITOT 3.5 (H) 01/16/2016    Recent Labs Lab  01/23/16 1740  INR 1.28   Assessment/Plan: Heidi Villegas is a 75 y.o. female with very serious resp failure on a vent with a rectal tube in who has been in hospital for several days now passing BRBPR.  She was given 2 units and hgb went from 6.3 to 10.4.  Will hold off on any endoscopic interventions and see how she does on PPI and without rectal tube for a few days.  Recommendations:  Thank you for the consult. Please call with questions or concerns.  Gaylyn Cheers, MD

## 2016-01-24 NOTE — Progress Notes (Signed)
Pt cleaned of medium size bloody liquid stool.  Frank Red blood noted, mixed with stool.

## 2016-01-24 NOTE — Progress Notes (Signed)
Dorene Sorrow, NP, has spoken with GI regarding maroon to dark red stool.  Per GI, remove rectal tube.  Rectal tube removed without complication.

## 2016-01-24 NOTE — Progress Notes (Signed)
Pt had episode of bradycardia, HR 39 briefly, then resumed back to NSR with HR 70's.  Spoke with Dorene Sorrow, NP, order to discontinue amiodarone drip and obtain stat EKG.

## 2016-01-24 NOTE — Progress Notes (Signed)
Spoke with Dorene Sorrow, NP, regarding pt having GI bleed and tube feeding infusing.  Per Magadalene, discontinue tube feedings and prostat.  Since pt is now NPO, only give Sliding scale insulin q4h (discontinue scheduled 6 units novolog q4h).

## 2016-01-25 ENCOUNTER — Inpatient Hospital Stay: Payer: PPO

## 2016-01-25 LAB — RENAL FUNCTION PANEL
ALBUMIN: 1.1 g/dL — AB (ref 3.5–5.0)
ALBUMIN: 1.2 g/dL — AB (ref 3.5–5.0)
ALBUMIN: 1.2 g/dL — AB (ref 3.5–5.0)
ANION GAP: 6 (ref 5–15)
ANION GAP: 4 — AB (ref 5–15)
ANION GAP: 5 (ref 5–15)
Albumin: 1.3 g/dL — ABNORMAL LOW (ref 3.5–5.0)
Albumin: 1.3 g/dL — ABNORMAL LOW (ref 3.5–5.0)
Albumin: 1.2 g/dL — ABNORMAL LOW (ref 3.5–5.0)
Anion gap: 5 (ref 5–15)
Anion gap: 7 (ref 5–15)
Anion gap: 5 (ref 5–15)
BUN: 26 mg/dL — ABNORMAL HIGH (ref 6–20)
BUN: 27 mg/dL — AB (ref 6–20)
BUN: 27 mg/dL — AB (ref 6–20)
BUN: 28 mg/dL — AB (ref 6–20)
BUN: 26 mg/dL — ABNORMAL HIGH (ref 6–20)
BUN: 26 mg/dL — ABNORMAL HIGH (ref 6–20)
CALCIUM: 7.9 mg/dL — AB (ref 8.9–10.3)
CALCIUM: 8.4 mg/dL — AB (ref 8.9–10.3)
CALCIUM: 8.3 mg/dL — AB (ref 8.9–10.3)
CALCIUM: 8.2 mg/dL — AB (ref 8.9–10.3)
CALCIUM: 8.1 mg/dL — AB (ref 8.9–10.3)
CHLORIDE: 107 mmol/L (ref 101–111)
CHLORIDE: 106 mmol/L (ref 101–111)
CHLORIDE: 107 mmol/L (ref 101–111)
CO2: 26 mmol/L (ref 22–32)
CO2: 28 mmol/L (ref 22–32)
CO2: 27 mmol/L (ref 22–32)
CO2: 28 mmol/L (ref 22–32)
CO2: 27 mmol/L (ref 22–32)
CO2: 26 mmol/L (ref 22–32)
CREATININE: 0.8 mg/dL (ref 0.44–1.00)
CREATININE: 0.73 mg/dL (ref 0.44–1.00)
Calcium: 8.4 mg/dL — ABNORMAL LOW (ref 8.9–10.3)
Chloride: 108 mmol/L (ref 101–111)
Chloride: 108 mmol/L (ref 101–111)
Chloride: 108 mmol/L (ref 101–111)
Creatinine, Ser: 0.7 mg/dL (ref 0.44–1.00)
Creatinine, Ser: 0.71 mg/dL (ref 0.44–1.00)
Creatinine, Ser: 0.78 mg/dL (ref 0.44–1.00)
Creatinine, Ser: 0.7 mg/dL (ref 0.44–1.00)
GFR calc Af Amer: >60 mL/min (ref >60)
GFR calc Af Amer: >60 mL/min (ref >60)
GFR calc non Af Amer: >60 mL/min (ref >60)
GFR calc non Af Amer: >60 mL/min (ref >60)
GFR calc non Af Amer: >60 mL/min (ref >60)
GLUCOSE: 127 mg/dL — AB (ref 65–99)
GLUCOSE: 121 mg/dL — AB (ref 65–99)
GLUCOSE: 128 mg/dL — AB (ref 65–99)
Glucose, Bld: 126 mg/dL — ABNORMAL HIGH (ref 65–99)
Glucose, Bld: 132 mg/dL — ABNORMAL HIGH (ref 65–99)
Glucose, Bld: 131 mg/dL — ABNORMAL HIGH (ref 65–99)
PHOSPHORUS: 2.7 mg/dL (ref 2.5–4.6)
PHOSPHORUS: 2.6 mg/dL (ref 2.5–4.6)
POTASSIUM: 4.7 mmol/L (ref 3.5–5.1)
POTASSIUM: 4.8 mmol/L (ref 3.5–5.1)
POTASSIUM: 4.6 mmol/L (ref 3.5–5.1)
POTASSIUM: 4.5 mmol/L (ref 3.5–5.1)
Phosphorus: 2.9 mg/dL (ref 2.5–4.6)
Phosphorus: 2.8 mg/dL (ref 2.5–4.6)
Phosphorus: 3 mg/dL (ref 2.5–4.6)
Phosphorus: 2.8 mg/dL (ref 2.5–4.6)
Potassium: 4.9 mmol/L (ref 3.5–5.1)
Potassium: 4.9 mmol/L (ref 3.5–5.1)
SODIUM: 140 mmol/L (ref 135–145)
SODIUM: 140 mmol/L (ref 135–145)
SODIUM: 140 mmol/L (ref 135–145)
SODIUM: 140 mmol/L (ref 135–145)
SODIUM: 139 mmol/L (ref 135–145)
Sodium: 139 mmol/L (ref 135–145)

## 2016-01-25 LAB — BLOOD GAS, ARTERIAL
Acid-Base Excess: 1.9 mmol/L (ref 0.0–2.0)
Bicarbonate: 29.5 mmol/L — ABNORMAL HIGH (ref 20.0–28.0)
DELIVERY SYSTEMS: POSITIVE
Expiratory PAP: 7
FIO2: 50
INSPIRATORY PAP: 18
O2 Saturation: 88.7 %
PO2 ART: 62 mmHg — AB (ref 83.0–108.0)
Patient temperature: 37
pCO2 arterial: 60 mmHg — ABNORMAL HIGH (ref 32.0–48.0)
pH, Arterial: 7.3 — ABNORMAL LOW (ref 7.350–7.450)

## 2016-01-25 LAB — TYPE AND SCREEN
BLOOD PRODUCT EXPIRATION DATE: 201712192359
Blood Product Expiration Date: 201712132359
ISSUE DATE / TIME: 201712020251
ISSUE DATE / TIME: 201712020553
UNIT TYPE AND RH: 7300
Unit Type and Rh: 1700

## 2016-01-25 LAB — GLUCOSE, CAPILLARY
GLUCOSE-CAPILLARY: 109 mg/dL — AB (ref 65–99)
GLUCOSE-CAPILLARY: 112 mg/dL — AB (ref 65–99)
GLUCOSE-CAPILLARY: 114 mg/dL — AB (ref 65–99)
GLUCOSE-CAPILLARY: 127 mg/dL — AB (ref 65–99)
Glucose-Capillary: 110 mg/dL — ABNORMAL HIGH (ref 65–99)
Glucose-Capillary: 118 mg/dL — ABNORMAL HIGH (ref 65–99)

## 2016-01-25 LAB — CBC
HCT: 30.4 % — ABNORMAL LOW (ref 35.0–47.0)
Hemoglobin: 10.4 g/dL — ABNORMAL LOW (ref 12.0–16.0)
MCH: 28.5 pg (ref 26.0–34.0)
MCHC: 34.2 g/dL (ref 32.0–36.0)
MCV: 83.1 fL (ref 80.0–100.0)
Platelets: 115 10*3/uL — ABNORMAL LOW (ref 150–440)
RBC: 3.65 MIL/uL — ABNORMAL LOW (ref 3.80–5.20)
RDW: 15.8 % — AB (ref 11.5–14.5)
WBC: 32.9 10*3/uL — AB (ref 3.6–11.0)

## 2016-01-25 LAB — MAGNESIUM: MAGNESIUM: 1.6 mg/dL — AB (ref 1.7–2.4)

## 2016-01-25 MED ORDER — FENTANYL CITRATE (PF) 100 MCG/2ML IJ SOLN
INTRAMUSCULAR | Status: AC
  Administered 2016-01-25: 14:00:00
  Filled 2016-01-25: qty 4

## 2016-01-25 MED ORDER — FENTANYL 2500MCG IN NS 250ML (10MCG/ML) PREMIX INFUSION
50.0000 ug/h | INTRAVENOUS | Status: DC
Start: 2016-01-25 — End: 2016-01-27
  Administered 2016-01-25 (×2): 250 ug/h via INTRAVENOUS
  Filled 2016-01-25 (×2): qty 250

## 2016-01-25 MED ORDER — FENTANYL CITRATE (PF) 100 MCG/2ML IJ SOLN
100.0000 ug | Freq: Once | INTRAMUSCULAR | Status: AC
  Administered 2016-01-25: 100 ug via INTRAVENOUS

## 2016-01-25 MED ORDER — MAGNESIUM SULFATE 2 GM/50ML IV SOLN
2.0000 g | Freq: Once | INTRAVENOUS | Status: AC
  Administered 2016-01-25: 2 g via INTRAVENOUS
  Filled 2016-01-25: qty 50

## 2016-01-25 MED ORDER — MIDAZOLAM HCL 2 MG/2ML IJ SOLN
4.0000 mg | Freq: Once | INTRAMUSCULAR | Status: AC
  Administered 2016-01-25: 4 mg via INTRAVENOUS

## 2016-01-25 MED ORDER — FENTANYL 2500MCG IN NS 250ML (10MCG/ML) PREMIX INFUSION
INTRAVENOUS | Status: AC
  Administered 2016-01-25: 250 ug/h
  Filled 2016-01-25: qty 250

## 2016-01-25 MED ORDER — VECURONIUM BROMIDE 10 MG IV SOLR
INTRAVENOUS | Status: AC
  Filled 2016-01-25: qty 10

## 2016-01-25 MED ORDER — MORPHINE SULFATE (PF) 4 MG/ML IV SOLN
2.0000 mg | Freq: Once | INTRAVENOUS | Status: AC
  Administered 2016-01-25: 2 mg via INTRAVENOUS
  Filled 2016-01-25: qty 1

## 2016-01-25 MED ORDER — VECURONIUM BROMIDE 10 MG IV SOLR
10.0000 mg | Freq: Once | INTRAVENOUS | Status: AC
  Administered 2016-01-25: 10 mg via INTRAVENOUS

## 2016-01-25 MED ORDER — MIDAZOLAM HCL 2 MG/2ML IJ SOLN
INTRAMUSCULAR | Status: AC
  Filled 2016-01-25: qty 4

## 2016-01-25 NOTE — Progress Notes (Signed)
Pt vent placed on spontaneous mode by Dr. Mortimer Fries. Evaluating patients ability to be extubated shortly.

## 2016-01-25 NOTE — Progress Notes (Signed)
Pharmacy Antibiotic Note/ Electrolyte Monitoring/ CRRT Medication Adjustment  Heidi Villegas is a 75 y.o. female admitted on 01/21/2016 with sepsis.  Pharmacy has been consulted for ceftriaxone dosing.  Pharmacy also consulted to assist in electrolyte monitoring and to adjust medications based on CRRT. Pt started CRRT 11/28 and appears to be doing better 11/29  Plan: 1. Antibiotics Continue ceftriaxone 2 g IV q 24 hours for E.coli bacteremia. Stop date to total 14 days of abx therapy.  2. Electrolytes 12/3: Mag = 1.6, K and Phos WNL. Ordered Mag 2g IV bolus.  Will f/u AM labs and continue to replace per protocol.  Patient is also taking KCl 20meq PO daily.  3. CRRT No medication adjustments needed at this time. Per rounds, pt will continue on CRRT for now. Will continue to monitor.   Height: 5\' 1"  (154.9 cm) Weight: 234 lb 2.1 oz (106.2 kg) IBW/kg (Calculated) : 47.8  Temp (24hrs), Avg:97.3 F (36.3 C), Min:96.6 F (35.9 C), Max:97.5 F (36.4 C)   Recent Labs Lab 01/23/16 0405  01/23/16 1740 01/23/16 2305  01/24/16 0632 01/24/16 0902 01/24/16 1425 01/24/16 1709 01/24/16 2239 01/25/16 0423  WBC 22.4*  --  23.8* 20.6*  --   --  28.7*  --   --   --  32.9*  CREATININE 0.82  < > 0.70 0.62  < > 0.77  --  0.76 0.77 0.79 0.70  < > = values in this interval not displayed.  Estimated Creatinine Clearance: 70.4 mL/min (by C-G formula based on SCr of 0.7 mg/dL).    Allergies  Allergen Reactions  . Codeine Nausea And Vomiting  . Sulfa Antibiotics     Antimicrobials this admission: Piperacillin/tazobactam 11/23 >> 11/24 Vancomycin 11/23 >> 11/24, 11/25 >> 11/26 Meropenem 11/24 >> 11/27 Ceftriaxone 11/27 >>  Microbiology results: 11/23 BCx: E coli 2/2, staph 1/2, S ceftriaxone 11/23 UCx: E coli resistant to ampicillin and amp/sulbactam 11/23 Sputum: pending  11/23 MRSA PCR: negative  Thank you for allowing pharmacy to be a part of this patient's  care.  Olivia Canter [facility] Clinical Pharmacist 01/25/2016 8:10 AM

## 2016-01-25 NOTE — Progress Notes (Signed)
Patient was extubated earlier this AM after tolerating PS mode. Patient subsequently developed worsening resp distress and placed on biPAP iPAP settings increased and patient with progressive resp failure  Patient emergently intubated and placed on MV support.    Corrin Parker, M.D.  [facility]  Medical Director [place] Director [facility] Cardio-Pulmonary Department

## 2016-01-25 NOTE — Procedures (Signed)
Endotracheal Intubation: Patient required placement of an artificial airway secondary to resp failure.   Consent: Emergent.   Hand washing performed prior to starting the procedure.   Medications administered for sedation prior to procedure: Midazolam 4 mg IV,  Vecuronium 10 mg IV, Fentanyl 100 mcg IV.   Procedure: A time out procedure was called and correct patient, name, & ID confirmed. Needed supplies and equipment were assembled and checked to include ETT, 10 ml syringe, Glidescope, Mac and Miller blades, suction, oxygen and bag mask valve, end tidal CO2 monitor. Patient was positioned to align the mouth and pharynx to facilitate visualization of the glottis.  Heart rate, SpO2 and blood pressure was continuously monitored during the procedure. Pre-oxygenation was conducted prior to intubation and endotracheal tube was placed through the vocal cords into the trachea.  During intubation an assistant applied gentle pressure to the cricoid cartilage.   The artificial airway was placed under direct visualization via glidescope route using a 8.0 ETT on the first attempt.    ETT was secured at 23 cm mark.    Placement was confirmed by auscuitation of lungs with good breath sounds bilaterally and no stomach sounds.  Condensation was noted on endotracheal tube.  Pulse ox 95%.  CO2 detector in place with appropriate color change.   Complications: None .   Operator: Jocee Kissick.   Chest radiograph ordered and pending.   Comments: OGT placed via glidescope.  Corrin Parker, M.D.  [facility]  Medical Director [place] Director [facility] Cardio-Pulmonary Department

## 2016-01-25 NOTE — Consult Note (Signed)
Patient extubated, on Bipap, hgb stable at 10.4, plt 115WBC 32.9  BP 107/65 R 28 P 78  Abdomen distended with some tinkling bowel sounds. Probable ileus.  Will get flat plate of abd.  She may not do well with an NG tube or rectal tube at this time but may need it tomorrow.

## 2016-01-25 NOTE — Progress Notes (Signed)
[facility]  ROUNDING NOTE   Subjective:  Patient was extubated this a.m. Currently on BiPAP. Tolerating continuous renal placement therapy well. Patient was net -2.1 L yesterday.    Objective:  Vital signs in last 24 hours:  Temp:  [97.5 F (36.4 C)] 97.5 F (36.4 C) (12/03 0100) Pulse Rate:  [70-86] 86 (12/03 0800) Resp:  [16-31] 25 (12/03 0900) BP: (98-136)/(49-86) 116/64 (12/03 0900) SpO2:  [91 %-95 %] 93 % (12/03 0800) FiO2 (%):  [40 %] 40 % (12/03 0723) Weight:  [106.2 kg (234 lb 2.1 oz)] 106.2 kg (234 lb 2.1 oz) (12/03 0500)  Weight change: 3.6 kg (7 lb 15 oz) Filed Weights   01/23/16 0718 01/24/16 0500 01/25/16 0500  Weight: 102.6 kg (226 lb 3.1 oz) 109.8 kg (242 lb 1 oz) 106.2 kg (234 lb 2.1 oz)    Intake/Output: I/O last 3 completed shifts: In: 1572.9 [I.V.:572.9; Blood:680; Other:140; NG/GT:180] Out: 1093 [Urine:172; ATFTD:3220]   Intake/Output this shift:  No intake/output data recorded.  Physical Exam: General: Critically ill appearing   Head: BiPAP facemask on   Eyes: Eyes open, anicteric  Neck: Supple, trachea midline  Lungs:  Scattered rhonchi bilateral, on bipap  Heart: S1S2 irregular  Abdomen:  Distended, bowel sounds present   Extremities: 2+ peripheral edema.  Neurologic: Awake, not following commands consistently  Skin: No lesions  Access: Right femoral temporary dialysis catheter     Basic Metabolic Panel:  Recent Labs Lab 01/19/16 0419  01/24/16 0540 01/24/16 0632 01/24/16 1425 01/24/16 1709 01/24/16 2239 01/25/16 0423  NA 129*  < >  --  139 138 139 139 140  K 3.4*  < >  --  5.2* 5.3* 5.1 5.0 4.7  CL 94*  < >  --  107 107 106 106 108  CO2 24  < >  --  26 26 27 26 26   GLUCOSE 288*  < >  --  130* 126* 134* 131* 127*  BUN 70*  < >  --  27* 27* 28* 28* 26*  CREATININE 2.89*  < >  --  0.77 0.76 0.77 0.79 0.70  CALCIUM 6.2*  < >  --  7.9* 7.9* 8.1* 8.2* 7.9*  MG 2.1  --  1.7  --   --   --   --  1.6*  PHOS 4.6  < >  2.4* 2.7 2.8 2.9 3.0 2.7  < > = values in this interval not displayed.  Liver Function Tests:  Recent Labs Lab 01/24/16 2542 01/24/16 1425 01/24/16 1709 01/24/16 2239 01/25/16 0423  ALBUMIN 1.2* 1.1* 1.2* 1.3* 1.1*   No results for input(s): LIPASE, AMYLASE in the last 168 hours. No results for input(s): AMMONIA in the last 168 hours.  CBC:  Recent Labs Lab 01/20/16 0501  01/22/16 0630 01/23/16 0405 01/23/16 1740 01/23/16 2305 01/24/16 0902 01/25/16 0423  WBC 25.1*  < > 25.3* 22.4* 23.8* 20.6* 28.7* 32.9*  NEUTROABS 23.9*  --  24.1* 21.0* 22.1*  --  27.1*  --   HGB 9.7*  < > 9.0* 8.7* 8.8* 6.3* 10.4* 10.4*  HCT 29.1*  < > 26.8* 25.5* 26.1* 19.2* 30.9* 30.4*  MCV 84.3  < > 82.0 82.4 83.9 83.2 83.0 83.1  PLT 66*  < > 105* 95* 109* 75* 105* 115*  < > = values in this interval not displayed.  Cardiac Enzymes: No results for input(s): CKTOTAL, CKMB, CKMBINDEX, TROPONINI in the last 168 hours.  BNP: Invalid input(s): POCBNP  CBG:  Recent Labs Lab 01/24/16 1602 01/24/16 1945 01/24/16 2355 01/25/16 0342 01/25/16 0731  GLUCAP 104* 109* 113* 110* 114*    Microbiology: Results for orders placed or performed during the hospital encounter of 01/13/2016  Urine culture     Status: Abnormal   Collection Time: 01/09/2016  1:29 PM  Result Value Ref Range Status   Specimen Description URINE, RANDOM  Final   Special Requests NONE  Final   Culture >=100,000 COLONIES/mL ESCHERICHIA COLI (A)  Final   Report Status 01/17/2016 FINAL  Final   Organism ID, Bacteria ESCHERICHIA COLI (A)  Final      Susceptibility   Escherichia coli - MIC*    AMPICILLIN >=32 RESISTANT Resistant     CEFAZOLIN <=4 SENSITIVE Sensitive     CEFTRIAXONE <=1 SENSITIVE Sensitive     CIPROFLOXACIN <=0.25 SENSITIVE Sensitive     GENTAMICIN <=1 SENSITIVE Sensitive     IMIPENEM <=0.25 SENSITIVE Sensitive     NITROFURANTOIN <=16 SENSITIVE Sensitive     TRIMETH/SULFA <=20 SENSITIVE Sensitive      AMPICILLIN/SULBACTAM >=32 RESISTANT Resistant     PIP/TAZO <=4 SENSITIVE Sensitive     Extended ESBL NEGATIVE Sensitive     * >=100,000 COLONIES/mL ESCHERICHIA COLI  Culture, blood (routine x 2)     Status: Abnormal   Collection Time: 12/28/2015  1:29 PM  Result Value Ref Range Status   Specimen Description BLOOD RIGHT HAND  Final   Special Requests   Final    BOTTLES DRAWN AEROBIC AND ANAEROBIC 11MLAERO,8MLANA   Culture  Setup Time   Final    GRAM NEGATIVE RODS IN BOTH AEROBIC AND ANAEROBIC BOTTLES CRITICAL RESULT CALLED TO, READ BACK BY AND VERIFIED WITH: NATE COOKSON AT [place] ON 01/16/16 [place]. Performed at [place] (A)  Final   Report Status 01/19/2016 FINAL  Final   Organism ID, Bacteria ESCHERICHIA COLI  Final      Susceptibility   Escherichia coli - MIC*    AMPICILLIN >=32 RESISTANT Resistant     CEFAZOLIN <=4 SENSITIVE Sensitive     CEFEPIME <=1 SENSITIVE Sensitive     CEFTAZIDIME <=1 SENSITIVE Sensitive     CEFTRIAXONE <=1 SENSITIVE Sensitive     CIPROFLOXACIN <=0.25 SENSITIVE Sensitive     GENTAMICIN <=1 SENSITIVE Sensitive     IMIPENEM <=0.25 SENSITIVE Sensitive     TRIMETH/SULFA <=20 SENSITIVE Sensitive     AMPICILLIN/SULBACTAM >=32 RESISTANT Resistant     PIP/TAZO <=4 SENSITIVE Sensitive     Extended ESBL NEGATIVE Sensitive     * ESCHERICHIA COLI  Culture, blood (routine x 2)     Status: Abnormal   Collection Time: 12/29/2015  1:29 PM  Result Value Ref Range Status   Specimen Description BLOOD LEFT ANTECUBITAL  Final   Special Requests   Final    BOTTLES DRAWN AEROBIC AND ANAEROBIC AER 12ML ANA 11ML   Culture  Setup Time   Final    GRAM NEGATIVE RODS IN BOTH AEROBIC AND ANAEROBIC BOTTLES CRITICAL VALUE NOTED.  VALUE IS CONSISTENT WITH PREVIOUSLY REPORTED AND CALLED VALUE. GRAM POSITIVE COCCI AEROBIC BOTTLE ONLY CRITICAL RESULT CALLED TO, READ BACK BY AND VERIFIED WITH: Charlane Ferretti @ 2423 01/16/16 by [facility].    Culture (A)   Final    ESCHERICHIA COLI SUSCEPTIBILITIES PERFORMED ON PREVIOUS CULTURE WITHIN THE LAST 5 DAYS. STAPHYLOCOCCUS SPECIES (COAGULASE NEGATIVE) THE SIGNIFICANCE OF ISOLATING THIS ORGANISM FROM A SINGLE SET OF BLOOD CULTURES WHEN MULTIPLE SETS ARE  DRAWN IS UNCERTAIN. PLEASE NOTIFY THE MICROBIOLOGY DEPARTMENT WITHIN ONE WEEK IF SPECIATION AND SENSITIVITIES ARE REQUIRED. Performed at [facility]    Report Status 12/27/2015 FINAL  Final  Blood Culture ID Panel (Reflexed)     Status: Abnormal   Collection Time: 01/06/2016  1:29 PM  Result Value Ref Range Status   Enterococcus species NOT DETECTED NOT DETECTED Final   Listeria monocytogenes NOT DETECTED NOT DETECTED Final   Staphylococcus species NOT DETECTED NOT DETECTED Final   Staphylococcus aureus NOT DETECTED NOT DETECTED Final   Streptococcus species NOT DETECTED NOT DETECTED Final   Streptococcus agalactiae NOT DETECTED NOT DETECTED Final   Streptococcus pneumoniae NOT DETECTED NOT DETECTED Final   Streptococcus pyogenes NOT DETECTED NOT DETECTED Final   Acinetobacter baumannii NOT DETECTED NOT DETECTED Final   Enterobacteriaceae species DETECTED (A) NOT DETECTED Final    Comment: CRITICAL RESULT CALLED TO, READ BACK BY AND VERIFIED WITH: NATE COOKSON AT 0556 ON 01/16/16 [place].    Enterobacter cloacae complex NOT DETECTED NOT DETECTED Final   Escherichia coli DETECTED (A) NOT DETECTED Final    Comment: CRITICAL RESULT CALLED TO, READ BACK BY AND VERIFIED WITH: NATE COOKSON AT 4034 ON 01/16/16 [place].    Klebsiella oxytoca NOT DETECTED NOT DETECTED Final   Klebsiella pneumoniae NOT DETECTED NOT DETECTED Final   Proteus species NOT DETECTED NOT DETECTED Final   Serratia marcescens NOT DETECTED NOT DETECTED Final   Carbapenem resistance NOT DETECTED NOT DETECTED Final   Haemophilus influenzae NOT DETECTED NOT DETECTED Final   Neisseria meningitidis NOT DETECTED NOT DETECTED Final   Pseudomonas aeruginosa NOT DETECTED NOT DETECTED  Final   Candida albicans NOT DETECTED NOT DETECTED Final   Candida glabrata NOT DETECTED NOT DETECTED Final   Candida krusei NOT DETECTED NOT DETECTED Final   Candida parapsilosis NOT DETECTED NOT DETECTED Final   Candida tropicalis NOT DETECTED NOT DETECTED Final  Blood Culture ID Panel (Reflexed)     Status: Abnormal   Collection Time: 01/17/2016  1:40 PM  Result Value Ref Range Status   Enterococcus species NOT DETECTED NOT DETECTED Final   Vancomycin resistance NOT DETECTED NOT DETECTED Final   Listeria monocytogenes NOT DETECTED NOT DETECTED Final   Staphylococcus species DETECTED (A) NOT DETECTED Final    Comment: CRITICAL RESULT CALLED TO, READ BACK BY AND VERIFIED WITH: Charlane Ferretti @ 7425 01/16/16 by [facility].    Staphylococcus aureus NOT DETECTED NOT DETECTED Final   Methicillin resistance NOT DETECTED NOT DETECTED Final   Streptococcus species NOT DETECTED NOT DETECTED Final   Streptococcus agalactiae NOT DETECTED NOT DETECTED Final   Streptococcus pneumoniae NOT DETECTED NOT DETECTED Final   Streptococcus pyogenes NOT DETECTED NOT DETECTED Final   Acinetobacter baumannii NOT DETECTED NOT DETECTED Final   Enterobacteriaceae species DETECTED (A) NOT DETECTED Final    Comment: CRITICAL RESULT CALLED TO, READ BACK BY AND VERIFIED WITH: Charlane Ferretti @ 9563 01/16/16 by [facility].    Enterobacter cloacae complex NOT DETECTED NOT DETECTED Final   Escherichia coli DETECTED (A) NOT DETECTED Final    Comment: CRITICAL RESULT CALLED TO, READ BACK BY AND VERIFIED WITH: Charlane Ferretti @ 8756 01/16/16 by [facility].    Klebsiella oxytoca NOT DETECTED NOT DETECTED Final   Klebsiella pneumoniae NOT DETECTED NOT DETECTED Final   Proteus species NOT DETECTED NOT DETECTED Final   Serratia marcescens NOT DETECTED NOT DETECTED Final   Carbapenem resistance NOT DETECTED NOT DETECTED Final   Haemophilus influenzae NOT DETECTED NOT DETECTED  Final   Neisseria meningitidis NOT DETECTED NOT DETECTED Final    Pseudomonas aeruginosa NOT DETECTED NOT DETECTED Final   Candida albicans NOT DETECTED NOT DETECTED Final   Candida glabrata NOT DETECTED NOT DETECTED Final   Candida krusei NOT DETECTED NOT DETECTED Final   Candida parapsilosis NOT DETECTED NOT DETECTED Final   Candida tropicalis NOT DETECTED NOT DETECTED Final  MRSA PCR Screening     Status: None   Collection Time: 01/06/2016  7:00 PM  Result Value Ref Range Status   MRSA by PCR NEGATIVE NEGATIVE Final    Comment:        The GeneXpert MRSA Assay (FDA approved for NASAL specimens only), is one component of a comprehensive MRSA colonization surveillance program. It is not intended to diagnose MRSA infection nor to guide or monitor treatment for MRSA infections.   C difficile quick scan w PCR reflex     Status: None   Collection Time: 01/23/16  5:18 PM  Result Value Ref Range Status   C Diff antigen NEGATIVE NEGATIVE Final   C Diff toxin NEGATIVE NEGATIVE Final   C Diff interpretation No C. difficile detected.  Final    Coagulation Studies:  Recent Labs  01/23/16 1740  LABPROT 16.1*  INR 1.28    Urinalysis: No results for input(s): COLORURINE, LABSPEC, PHURINE, GLUCOSEU, HGBUR, BILIRUBINUR, KETONESUR, PROTEINUR, UROBILINOGEN, NITRITE, LEUKOCYTESUR in the last 72 hours.  Invalid input(s): APPERANCEUR    Imaging: Dg Chest Port 1 View  Result Date: 01/25/2016 CLINICAL DATA:  75 year old female with history of acute respiratory failure. EXAM: PORTABLE CHEST 1 VIEW COMPARISON:  Chest x-ray 01/12/2016. FINDINGS: An endotracheal tube is in place with tip 3.6 cm above the carina. There is a right-sided internal jugular central venous catheter with tip terminating in the superior cavoatrial junction. Patchy multifocal asymmetrically distributed interstitial and airspace disease is again noted throughout the lungs bilaterally, most confluent in the right upper lobe and in the left mid lung, concerning for severe multilobar  pneumonia. No definite pleural effusions. No evidence of pulmonary edema. Heart size is normal. Upper mediastinal contours are distorted by patient's rotation to the right. IMPRESSION: 1. Support apparatus, as above. 2. The appearance of the chest is again most compatible with severe multilobar pneumonia. Aeration has slightly improved in the left mid lung, but appears slightly worsened in the right upper lobe. Electronically Signed   By: Vinnie Langton M.D.   On: 01/25/2016 08:30     Medications:   . dextrose    . norepinephrine (LEVOPHED) Adult infusion Stopped (01/24/16 1000)  . pantoprozole (PROTONIX) infusion 8 mg/hr (01/25/16 0936)  . pureflow 2,500 mL/hr at 01/25/16 0634   . aspirin  81 mg Oral Daily  . atorvastatin  80 mg Oral QPM  . B-complex with vitamin C  1 tablet Oral Daily  . cefTRIAXone  2 g Intravenous Q24H  . chlorhexidine gluconate (MEDLINE KIT)  15 mL Mouth Rinse BID  . cholecalciferol  1,000 Units Oral Daily  . insulin aspart  2-6 Units Subcutaneous Q4H  . insulin glargine  17 Units Subcutaneous Q24H  . ipratropium-albuterol  3 mL Nebulization Q6H  . magnesium sulfate 1 - 4 g bolus IVPB  2 g Intravenous Once  . mouth rinse  15 mL Mouth Rinse QID  . methylPREDNISolone (SOLU-MEDROL) injection  40 mg Intravenous Q12H  . potassium chloride  20 mEq Oral Daily  . sodium chloride flush  10-40 mL Intracatheter Q12H   acetaminophen, bisacodyl, dextrose, heparin,  ipratropium-albuterol, metoprolol, midazolam, ondansetron **OR** ondansetron (ZOFRAN) IV, sennosides, sodium chloride flush, traMADol, vecuronium  Assessment/ Plan:  75 y.o. Caucasian female with medical problems of diabetes, hypertension, atrial fibrillation, was admitted on 01/19/2016 with sepsis from urinary source and bilateral pneumonia.   1. Acute renal failure, likely ATN from concurrent illness 2. Bilateral pneumonia 3. Acute respiratory failure, s/p extubation 01/25/16.  4. Sepsis. Escherichia coli in  blood and urine. Hypotension requiring pressors 5. Hypocalcemia, low albumin noted 6. 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 Creatinine baseline 1.1 from October 2017. GFR 49 7. Acidosis, mixed respiratory and metabolic 8. Left hydronephrosis from 6 mm stone. Urgent stent placement early morning 11/26  Plan:  Patient extubated this a.m. however she is currently on BiPAP. Patient has done well with continuous renal placement therapy over the preceding 24 hours. Patient remains oliguric at this time with urine output of 172 cc over the preceding 24 hours. However good ultrafiltration was noted at 3 L yesterday making the patient that -2.1 L yesterday. Patient still has considerable edema and volume overload on board. Therefore we will continue continuous renal placement therapy with ultrafiltration target of 150 cc per hour. Continue to monitor electrolytes closely.   LOS: 10 Zelphia Glover 12/3/201710:29 AM

## 2016-01-25 NOTE — Progress Notes (Signed)
Extubated per MD order,placed on 6lnc. She seems somewhat confused,o2 sat 88%.Placed on bipap 14/7 50% o2sats now 93%

## 2016-01-25 NOTE — Progress Notes (Signed)
After blood gas results, Dr. Mortimer Fries reintubated patient.  Verified placement via auscultation and X-ray as well as ET Co2 detector.

## 2016-01-25 NOTE — Progress Notes (Signed)
PULMONARY / CRITICAL CARE MEDICINE   Name: Heidi Villegas MRN: [id number] DOB: [birth year]    ADMISSION DATE:  01/03/2016    CONSULTATION DATE:  01/16/16  REFERRING MD: Dr. Estanislado Pandy  CHIEF COMPLAINT:  Respiratory distress  HISTORY OF PRESENT ILLNESS:   Heidi Villegas is a 75 yo female with PMH significant for Arthritis, Hypertension and DM.  Patient [facility] ON 11/23 with severe sepsis, elevated lactic acid and altered mental status. On 11/24 patient was  Was noted to be severe respiratory distress along with tachycardia and hypertension.  PCCM team was consulted for further management.  SUBJECTIVE:  Patient remains intubated,plan for SAT/SBT Remains critically ill Weaned to fio2 40% PEEP 5  Acute GI bleed noted at about 7 PM 01/23/2016 Hemoglobin dropped from 8.8 g to 6.3 g. 2 units of packed red blood cells ordered, GI consulted and recommended blood transfusion, IV Protonix, and discontinuation of rectal tube. Rectal tube removed    VITAL SIGNS: BP 100/60   Pulse (!) 107   Temp 97.6 F (36.4 C) (Oral)   Resp (!) 30   Ht 5\' 1"  (1.549 m)   Wt 110.2 kg (242 lb 15.2 oz)   SpO2 100%   BMI 45.90 kg/m   HEMODYNAMICS: CVP:  [6 mmHg] 6 mmHg  VENTILATOR SETTINGS: Vent Mode: PRVC FiO2 (%):  [80 %-100 %]40 % Set Rate:  [30 bmp] 30 bmp Vt Set:  [500 mL] 500 mL PEEP:  [15 cmH20] 15 cmH20  INTAKE / OUTPUT: I/O last 3 completed shifts: In: 4642.4 [I.V.:2662.4; Other:120; NG/GT:1860] Out: [id number] [Urine:355; JK:[id number]; Stool:75]  PHYSICAL EXAMINATION: General:  Acutely ill Caucasian female, well nourished HEENT:  Atraumatic, normocephalic, no discharge, no JVD appreciated Cardiovascular: irregularly irregular, no M/R/G noted, 2+ generalized edema Lungs: Diminished bibasilar, RRR even, non labored Abdomen:  Soft, nontender, active bowel sounds, flexiseal in place Musculoskeletal:  No inflammation/deformity noted Skin:  Grossly intact GCS<10T  STUDIES:  11/23  Renal ultrasound>>Mild to moderate left hydronephrosis without obstructing cause identified, right renal mass.  11/25 2-D echo>>EF 40 to 45% 11/25 CT Abd Pelvis>>Bilateral mild perinephric stranding. There is mild to moderate left hydronephrosis. Axial image 43 there is 6 mm calcified obstructive calculus in proximal left ureter at the level of upper endplate of L4 vertebral body. There is a Foley catheter within decompressed urinary bladder. No small bowel obstruction.  Few diverticula are noted descending colon. No evidence of acute colitis or diverticulitis. Status post hysterectomy. Degenerative changes thoracolumbar spine. There is about 4 mm anterolisthesis L5 on S1 vertebral body. Bilateral lower lobe posterior consolidation with air bronchogram highly suspicious for pneumonia.  SIGNIFICANT EVENTS: 11/23-Pt admitted with severe sepsis secondary to pylonephritis 11/24-Pt iin severe respiratory distress , requiring BiPAP 11/25-Pt failed Bipap requiring mechanical intubation 11/26- Dialysis per nephro 11/30-remains on CRRT 12/1 on CRRT fio2 50%, PEEP 15 12/2 40% PEEP 5, start SAT/SBT  LINES/TUBES: 01/16/16 Right IJ>> 11/25 ETT>> Right femoral dual lumen HD catheter 11/26>>   ASSESSMENT / PLAN:  75 y/o caucasian female presenting with urosepsis, septic shock and acute hypoxic respiratory failure necessitating intubation. CT abdomen 11/25 shows obstructing left renal calculi and hydronephrosis. Taken to the OR 11/26 for a cystoscopy with left retrogate pyelogram, ureteral stent and foley placement. Severe hypercarbia post op.   Patient with multiorgan failure, critically ill with e coli bacteremia  PULMONARY A: Acute hypoxic respiratory failure possibly related to  septic shock-Failed BiPAP and emergently intubated 11/25 due to persistent hypoxemia, tachypnea and tachycardia with  worsening mental status-severe hypercarbia post op Mix Metabolic and respiratory  acidosis.  P:   Full  vent support; settings adjusted with increased PEEP 15 wean as tolerated Chest x-ray and ABG when necessary Continue Nebulized bronchodilators. VAP Protocol Plan for SAT/SBT  CARDIOVASCULAR A:  Septic shock secondary to urosepsis-improving Tachycardia-heart rate dropped to the low 30s transiently and improved to the low 60s Elevated troponin likely demand ischemia secondary to acute respiratory failure and septic shock New onset Afib with RVR P:  Continuous telemetry Discontinue Amiodarone gtt Cardiology consulted appreciate input Levophed gtt as needed to maintain map >65 Hold off on all home blood pressure medications in light of hypotension  RENAL A:   AKI likely secondary to sepsis-improving Left hydronephrosis with obstructing renal calculi on CT S/P STAT cystoscopy with stent placement 01/07/2016  Right renal mass-not apparent on CT abdomen P:   Follow BMET Avoid nephrotoxic drugs Replace electrolytes per ICU protocol Urology consulted appreciate input Nephrology consulted appreciate input Continue CRRT per nephrology  GASTROINTESTINAL A:   Diarrhea Abdominal distension concerning for?illeus/bowel ischemia Acute GI bleed-likely lower GI bleed from rectal mucosa irritation from due to rectal fecal system P:   Hold tube feeds until bleeding resolves Orogastric tube care  Start Protonix infusion Transfuse packed red blood cells 2 units Monitor for further bleeding  GI consulted  HEMATOLOGIC A:   Anemia-acute blood loss superimposed on anemia of chronic disease  P:  Discontinue Heparin for VTE prophylaxis SCDs for VTE prophylaxis Trend CBC's Transfuse for hgb <7 Monitor for further rectal bleeding  INFECTIOUS A:   Urosepsis with septic shock-worsening leukocytosis; blood cultures positive for Escherichia coli, Enterobacteriaceae species, and Staphylococcus species in both aerobic and anaerobic bottles. Final cultures pending. Urine culture positive for  Escherichia coli with a colony count greater than 100,000 Leukocytosis with WBC=49 Hypothermia-resolved P:   Trend WBC's and monitor fever curve Continue Ceftriaxone  Follow cultures  CULTURES: 11/23 BC; GNR; PCR- enterobacter, Ecoli in both aerobic and anaerobic bottles  11/23 UC; Escherichia coli 11/24 Flu negative MRSA PCR 11/23 negative.   ANTIBIOTICS: 11/23 Vancomycin>>11/24 11/23 Zosyn>>11/23 11/25 Unasyn x 1dose 11/24 Meropenem>>11/27 11/27 Ceftriaxone>>  ENDOCRINE A:   Diabetes Melitus P:   Insulin gtt CBG's q1hrs  NEUROLOGIC A:   Acute metabolic encephalopathy likely secondary to sepsis P:   RASS score 0 to -1 Fentanyl and  Versed to maintain RASS goal WUA daily Lights on during the day   Disposition and family update: No family at bedside. Husband updated on current GI bleed and  need for transfusion  I have personally obtained a history, examined the patient, evaluated Pertinent laboratory and RadioGraph[employer]/imaging results, and  formulated the assessment and plan   The Patient requires high complexity decision making for assessment and support, frequent evaluation and titration of therapies, application of advanced monitoring technologies and extensive interpretation of multiple databases. Critical Care Time devoted to patient care services described in this note is 35 minutes.   Overall, patient is critically ill, prognosis is guarded.  Patient with Multiorgan failure and at high risk for cardiac arrest and death.   Corrin Parker, M.D.  [facility]  Medical Director [place] Director [facility] Cardio-Pulmonary Department

## 2016-01-26 ENCOUNTER — Inpatient Hospital Stay: Payer: PPO

## 2016-01-26 DIAGNOSIS — E8770 Fluid overload, unspecified: Secondary | ICD-10-CM

## 2016-01-26 DIAGNOSIS — K922 Gastrointestinal hemorrhage, unspecified: Secondary | ICD-10-CM

## 2016-01-26 LAB — CBC
HCT: 28.3 % — ABNORMAL LOW (ref 35.0–47.0)
Hemoglobin: 9.6 g/dL — ABNORMAL LOW (ref 12.0–16.0)
MCH: 28.5 pg (ref 26.0–34.0)
MCHC: 34.1 g/dL (ref 32.0–36.0)
MCV: 83.6 fL (ref 80.0–100.0)
PLATELETS: 108 10*3/uL — AB (ref 150–440)
RBC: 3.39 MIL/uL — AB (ref 3.80–5.20)
RDW: 16.5 % — AB (ref 11.5–14.5)
WBC: 31 10*3/uL — AB (ref 3.6–11.0)

## 2016-01-26 LAB — RENAL FUNCTION PANEL
ALBUMIN: 1.1 g/dL — AB (ref 3.5–5.0)
ANION GAP: 4 — AB (ref 5–15)
Albumin: 1.1 g/dL — ABNORMAL LOW (ref 3.5–5.0)
Anion gap: 3 — ABNORMAL LOW (ref 5–15)
BUN: 26 mg/dL — AB (ref 6–20)
BUN: 27 mg/dL — ABNORMAL HIGH (ref 6–20)
CALCIUM: 7.9 mg/dL — AB (ref 8.9–10.3)
CALCIUM: 8.2 mg/dL — AB (ref 8.9–10.3)
CHLORIDE: 109 mmol/L (ref 101–111)
CHLORIDE: 107 mmol/L (ref 101–111)
CO2: 27 mmol/L (ref 22–32)
CO2: 27 mmol/L (ref 22–32)
CREATININE: 0.73 mg/dL (ref 0.44–1.00)
CREATININE: 0.78 mg/dL (ref 0.44–1.00)
GFR calc Af Amer: >60 mL/min (ref >60)
Glucose, Bld: 118 mg/dL — ABNORMAL HIGH (ref 65–99)
Glucose, Bld: 109 mg/dL — ABNORMAL HIGH (ref 65–99)
Phosphorus: 2.5 mg/dL (ref 2.5–4.6)
Phosphorus: 2.4 mg/dL — ABNORMAL LOW (ref 2.5–4.6)
Potassium: 4.4 mmol/L (ref 3.5–5.1)
Potassium: 4.9 mmol/L (ref 3.5–5.1)
SODIUM: 139 mmol/L (ref 135–145)
SODIUM: 138 mmol/L (ref 135–145)

## 2016-01-26 LAB — GLUCOSE, CAPILLARY
GLUCOSE-CAPILLARY: 107 mg/dL — AB (ref 65–99)
GLUCOSE-CAPILLARY: 115 mg/dL — AB (ref 65–99)
GLUCOSE-CAPILLARY: 207 mg/dL — AB (ref 65–99)
Glucose-Capillary: 106 mg/dL — ABNORMAL HIGH (ref 65–99)
Glucose-Capillary: 145 mg/dL — ABNORMAL HIGH (ref 65–99)
Glucose-Capillary: 167 mg/dL — ABNORMAL HIGH (ref 65–99)

## 2016-01-26 LAB — PHOSPHORUS: PHOSPHORUS: 2.5 mg/dL (ref 2.5–4.6)

## 2016-01-26 LAB — MAGNESIUM: Magnesium: 1.9 mg/dL (ref 1.7–2.4)

## 2016-01-26 LAB — TRIGLYCERIDES: TRIGLYCERIDES: 170 mg/dL — AB (ref <150)

## 2016-01-26 MED ORDER — PROPOFOL 1000 MG/100ML IV EMUL
5.0000 ug/kg/min | INTRAVENOUS | Status: DC
  Administered 2016-01-26: 10 ug/kg/min via INTRAVENOUS
  Administered 2016-01-26: 5 ug/kg/min via INTRAVENOUS
  Administered 2016-01-27: 10 ug/kg/min via INTRAVENOUS
  Administered 2016-01-27: 20 ug/kg/min via INTRAVENOUS
  Filled 2016-01-26 (×3): qty 100

## 2016-01-26 MED ORDER — VITAL AF 1.2 CAL PO LIQD
1000.0000 mL | ORAL | Status: DC
  Administered 2016-01-26 – 2016-01-27 (×2): 1000 mL

## 2016-01-26 MED ORDER — PRO-STAT SUGAR FREE PO LIQD
30.0000 mL | Freq: Two times a day (BID) | ORAL | Status: DC
  Administered 2016-01-26 – 2016-01-28 (×5): 30 mL

## 2016-01-26 MED ORDER — AMIODARONE HCL IN DEXTROSE 360-4.14 MG/200ML-% IV SOLN
30.0000 mg/h | INTRAVENOUS | Status: DC
  Administered 2016-01-27 – 2016-01-28 (×3): 30 mg/h via INTRAVENOUS
  Filled 2016-01-26 (×3): qty 200

## 2016-01-26 MED ORDER — SODIUM CHLORIDE 0.9 % IV SOLN
8.0000 mg/h | INTRAVENOUS | Status: DC
  Administered 2016-01-26 – 2016-01-28 (×4): 8 mg/h via INTRAVENOUS
  Filled 2016-01-26 (×5): qty 80

## 2016-01-26 MED ORDER — AMIODARONE LOAD VIA INFUSION
150.0000 mg | Freq: Once | INTRAVENOUS | Status: AC
  Administered 2016-01-26: 150 mg via INTRAVENOUS
  Filled 2016-01-26: qty 83.34

## 2016-01-26 MED ORDER — PROPOFOL 1000 MG/100ML IV EMUL
INTRAVENOUS | Status: AC
  Administered 2016-01-26: 5 ug/kg/min via INTRAVENOUS
  Filled 2016-01-26: qty 100

## 2016-01-26 MED ORDER — AMIODARONE HCL IN DEXTROSE 360-4.14 MG/200ML-% IV SOLN
60.0000 mg/h | INTRAVENOUS | Status: AC
  Administered 2016-01-26 (×2): 60 mg/h via INTRAVENOUS
  Filled 2016-01-26 (×2): qty 200

## 2016-01-26 NOTE — Progress Notes (Signed)
Nutrition Follow-up  DOCUMENTATION CODES:   Obesity unspecified  INTERVENTION:  -Discussed nutritional poc with MD Ashby Dawes during ICU rounds and received verbal order from MD to restart TF. Recommend resuming Vital AF 1.2 at rate of 45 ml/hr with Prostat BID providing 1496 kcals, 111 g protein and 875 mL free water. Continue to assess   NUTRITION DIAGNOSIS:   Inadequate oral intake related to inability to eat as evidenced by NPO status.  Being addressed via TF  GOAL:   Provide needs based on ASPEN/SCCM guidelines  MONITOR:   Vent status, Labs, Weight trends, TF tolerance, I & O's  REASON FOR ASSESSMENT:   Consult, Ventilator Enteral/tube feeding initiation and management  ASSESSMENT:   75 y/o caucasian female presenting with urosepsis, septic shock and acute hypoxic respiratory failure necessitating intubation. CT abdomen 11/25 shows obstructing left renal calculi and hydronephrosis  Pt extubated on 12/3 but required reintubation. TF discontinued with extubation Currently sedated on vent, on CRRT with UF 125 ml/hr Labs: phosphorus 2.4 Meds: reviewed  Diet Order:  Diet NPO time specified  Skin:  Wound (see comment) (deep tissue injury to L. heel)  Last BM:  12/3  Height:   Ht Readings from Last 1 Encounters:  01/23/16 5\' 1"  (1.549 m)    Weight:   Wt Readings from Last 1 Encounters:  01/26/16 228 lb 6.3 oz (103.6 kg)    Ideal Body Weight:  47.72 kg  BMI:  Body mass index is 43.16 kg/m.  Estimated Nutritional Needs:   Kcal:  [id number] calories  Protein:  96-120 g  Fluid:  Per MD/NP/PA  EDUCATION NEEDS:   No education needs identified at this time  Heidi Villegas Heidi Villegas, Heidi Villegas, Heidi Villegas [phone] Pager  [phone] Weekend/On-Call Pager

## 2016-01-26 NOTE — Progress Notes (Signed)
Per Dr Tiffany Kocher, continue IV infsion pantoprazole d/t continued GI bleeding, per him patient's ABD is less taut and her bowel sounds are more audible

## 2016-01-26 NOTE — Progress Notes (Signed)
elink notified of new AFib, and continued bloody stools.  Elink RN Will notify elink MD

## 2016-01-26 NOTE — Progress Notes (Signed)
CRRT now running after minimal troubleshoting

## 2016-01-26 NOTE — Progress Notes (Signed)
[place] Progress Note Patient Name: Heidi Villegas DOB: 1940-10-07 MRN: [id number]   Date of Service  01/26/2016  HPI/Events of Note  A-fib with RVR  eICU Interventions  Amiodarone drip, AST last 11/25 138 Recheck LFTs in AM.     Intervention Category Major Interventions: Arrhythmia - evaluation and management  YACOUB,WESAM 01/26/2016, 4:59 PM

## 2016-01-26 NOTE — Progress Notes (Signed)
Pt moved to room ICU 12, potential water leak on third floor.  Planned rinse back indicated possible clotting,  CRRT stopped temporarily for moving and setting up new cartridge.  Setting up in progress at this time.

## 2016-01-26 NOTE — Progress Notes (Signed)
PULMONARY / CRITICAL CARE MEDICINE   Name: Heidi Villegas MRN: [id number] DOB: [birth year]    ADMISSION DATE:  01/09/2016      ASSESSMENT / PLAN:  75 y/o caucasian female presenting with urosepsis, septic shock and acute hypoxic respiratory failure necessitating intubation. CT abdomen 11/25 shows obstructing left renal calculi and hydronephrosis. Taken to the OR 11/26 for a cystoscopy with left retrogate pyelogram, ureteral stent and foley placement. Severe hypercarbia post op.  Extubated 12/4, then reintubated same day.   Patient with multiorgan failure, critically ill with e coli bacteremia  PULMONARY A: Acute hypoxic respiratory failure possibly related to  septic shock-Failed BiPAP and emergently intubated 11/25 due to persistent hypoxemia, tachypnea and tachycardia with worsening mental status-severe hypercarbia post op Mix Metabolic and respiratory  acidosis. Failed extubation on 12/4 and reintubated.  Vent PRVC/18/500/10/40% CXR images reviewed; continued volume overload.  P:   Full vent support; settings adjusted with increased PEEP 10 wean as tolerated Chest x-ray and ABG when necessary Continue Nebulized bronchodilators. VAP Protocol Plan for SAT/SBT  CARDIOVASCULAR A:  Septic shock secondary to urosepsis-improving Tachycardia-heart rate dropped to the low 30s transiently and improved to the low 60s Elevated troponin likely demand ischemia secondary to acute respiratory failure and septic shock New onset Afib with RVR P:  Continuous telemetry Discontinue Amiodarone gtt Cardiology consulted appreciate input Levophed gtt as needed to maintain map >65 Hold off on all home blood pressure medications in light of hypotension  RENAL A:   AKI likely secondary to sepsis-improving Left hydronephrosis with obstructing renal calculi on CT S/P STAT cystoscopy with stent placement 12/31/2015  Right renal mass-not apparent on CT abdomen P:   Follow BMET Avoid nephrotoxic  drugs Replace electrolytes per ICU protocol Urology consulted appreciate input Nephrology consulted appreciate input Continue CRRT per nephrology  GASTROINTESTINAL A:   Diarrhea Abdominal distension concerning for?illeus/bowel ischemia Acute GI bleed-likely lower GI bleed from rectal mucosa irritation from due to rectal fecal system P:   Hold tube feeds until bleeding resolves Orogastric tube care  Start Protonix infusion Transfuse packed red blood cells 2 units Monitor for further bleeding  GI consulted  HEMATOLOGIC A:   Anemia-acute blood loss superimposed on anemia of chronic disease  P:  Discontinue Heparin for VTE prophylaxis SCDs for VTE prophylaxis Trend CBC's Transfuse for hgb <7 Monitor for further rectal bleeding  INFECTIOUS A:   Urosepsis with septic shock-worsening leukocytosis; blood cultures positive for Escherichia coli, Enterobacteriaceae species, and Staphylococcus species in both aerobic and anaerobic bottles. Final cultures pending. Urine culture positive for Escherichia coli with a colony count greater than 100,000 Leukocytosis with WBC=49 Hypothermia-resolved P:   Trend WBC's and monitor fever curve Continue Ceftriaxone  Follow cultures  CULTURES: 11/23 BC; GNR; PCR- enterobacter, Ecoli in both aerobic and anaerobic bottles  11/23 UC; Escherichia coli 11/24 Flu negative MRSA PCR 11/23 negative.   ANTIBIOTICS: 11/23 Vancomycin>>11/24 11/23 Zosyn>>11/23 11/25 Unasyn x 1dose 11/24 Meropenem>>11/27 11/27 Ceftriaxone>>  ENDOCRINE A:   Diabetes Melitus P:   Insulin gtt CBG's q1hrs  NEUROLOGIC A:   Acute metabolic encephalopathy likely secondary to sepsis P:   RASS score 0 to -1 Fentanyl and  Versed to maintain RASS goal WUA daily Lights on during the day   LINES/TUBES: 01/16/16 Right IJ>> 11/25 ETT>> Now day 10 12/4 Right femoral dual lumen HD catheter  11/26>>  -----------------------------------------------------------------------  SUBJECTIVE:  Patient remains intubated,plan for SAT/SBT Remains critically ill Weaned to fio2 40% PEEP 5  Acute GI bleed noted  at about 7 PM 01/23/2016 Hemoglobin dropped from 8.8 g to 6.3 g. 2 units of packed red blood cells ordered, GI consulted and recommended blood transfusion, IV Protonix, and discontinuation of rectal tube. Rectal tube removed    VITAL SIGNS: BP 100/60   Pulse (!) 107   Temp 97.6 F (36.4 C) (Oral)   Resp (!) 30   Ht 5\' 1"  (1.549 m)   Wt 110.2 kg (242 lb 15.2 oz)   SpO2 100%   BMI 45.90 kg/m   HEMODYNAMICS: CVP:  [6 mmHg] 6 mmHg  VENTILATOR SETTINGS: Vent Mode: PRVC FiO2 (%):  [80 %-100 %]40 % Set Rate:  [30 bmp] 30 bmp Vt Set:  [500 mL] 500 mL PEEP:  [15 cmH20] 15 cmH20  INTAKE / OUTPUT: I/O last 3 completed shifts: In: 4642.4 [I.V.:2662.4; Other:120; NG/GT:1860] Out: [id number] [Urine:355; BA:633978; Stool:75]  PHYSICAL EXAMINATION: General:  Acutely ill Caucasian female, well nourished HEENT:  Atraumatic, normocephalic, no discharge, no JVD appreciated Cardiovascular: irregularly irregular, no M/R/G noted, 2+ generalized edema Lungs: Diminished bibasilar, RRR even, non labored Abdomen:  Soft, nontender, active bowel sounds, flexiseal in place Musculoskeletal:  No inflammation/deformity noted Skin:  Grossly intact GCS<10T  STUDIES:  11/23 Renal ultrasound>>Mild to moderate left hydronephrosis without obstructing cause identified, right renal mass.  11/25 2-D echo>>EF 40 to 45% 11/25 CT Abd Pelvis>>Bilateral mild perinephric stranding. There is mild to moderate left hydronephrosis. Axial image 43 there is 6 mm calcified obstructive calculus in proximal left ureter at the level of upper endplate of L4 vertebral body. There is a Foley catheter within decompressed urinary bladder. No small bowel obstruction.  Few diverticula are noted descending colon. No evidence of  acute colitis or diverticulitis. Status post hysterectomy. Degenerative changes thoracolumbar spine. There is about 4 mm anterolisthesis L5 on S1 vertebral body. Bilateral lower lobe posterior consolidation with air bronchogram highly suspicious for pneumonia.  SIGNIFICANT EVENTS: 11/23-Pt admitted with severe sepsis secondary to pylonephritis 11/24-Pt iin severe respiratory distress , requiring BiPAP 11/25-Pt failed Bipap requiring mechanical intubation 11/26- Dialysis per nephro 11/30-remains on CRRT 12/1 on CRRT fio2 50%, PEEP 15; Gi bleed.  12/2 40% PEEP 5, start SAT/SBT 12/3 extubated, then reintubated.     Marda Stalker, M.D.  01/26/2016   Critical Care Attestation.  I have personally obtained a history, examined the patient, evaluated laboratory and imaging results, formulated the assessment and plan and placed orders. The Patient requires high complexity decision making for assessment and support, frequent evaluation and titration of therapies, application of advanced monitoring technologies and extensive interpretation of multiple databases. The patient has critical illness that could lead imminently to failure of 1 or more organ systems and requires the highest level of physician preparedness to intervene.  Critical Care Time devoted to patient care services described in this note is 45 minutes and is exclusive of time spent in procedures.

## 2016-01-26 NOTE — Progress Notes (Signed)
Pharmacy Antibiotic Note/ Electrolyte Monitoring/ CRRT Medication Adjustment  Heidi Villegas is a 75 y.o. female admitted on 12/30/2015 with sepsis.  Pharmacy has been consulted for ceftriaxone dosing.  Pharmacy also consulted to assist in electrolyte monitoring and to adjust medications based on CRRT. Pt started CRRT 11/28 and appears to be doing better 11/29  Plan: 1. Antibiotics Continue ceftriaxone 2 g IV q 24 hours for E.coli bacteremia. Stop date to total 14 days of abx therapy.  2. Electrolytes All electrolytes wnl.   3. CRRT No medication adjustments needed at this time. Per nephrology, pt will continue on CRRT for now. Will continue to monitor.   Height: 5\' 1"  (154.9 cm) Weight: 228 lb 6.3 oz (103.6 kg) IBW/kg (Calculated) : 47.8  Temp (24hrs), Avg:95.3 F (35.2 C), Min:92.8 F (33.8 C), Max:98 F (36.7 C)   Recent Labs Lab 01/23/16 1740 01/23/16 2305  01/24/16 0902  01/25/16 0423  01/25/16 1039 01/25/16 1454 01/25/16 1844 01/25/16 2235 01/26/16 0300 01/26/16 0427  WBC 23.8* 20.6*  --  28.7*  --  32.9*  --   --   --   --   --   --  31.0*  CREATININE 0.70 0.62  < >  --   < > 0.70  < > 0.73 0.71 0.78 0.70 0.73  --   < > = values in this interval not displayed.  Estimated Creatinine Clearance: 69.3 mL/min (by C-G formula based on SCr of 0.73 mg/dL).    Allergies  Allergen Reactions  . Codeine Nausea And Vomiting  . Sulfa Antibiotics     Antimicrobials this admission: Piperacillin/tazobactam 11/23 >> 11/24 Vancomycin 11/23 >> 11/24, 11/25 >> 11/26 Meropenem 11/24 >> 11/27 Ceftriaxone 11/27 >>  Microbiology results: 11/23 BCx: E coli 2/2, staph 1/2, S ceftriaxone 11/23 UCx: E coli resistant to ampicillin and amp/sulbactam 11/23 Sputum: pending  11/23 MRSA PCR: negative  Thank you for allowing pharmacy to be a part of this patient's care.  Larene Beach [facility] Clinical Pharmacist 01/26/2016 10:55 AM

## 2016-01-26 NOTE — Progress Notes (Signed)
[facility]  ROUNDING NOTE   Subjective:   reintubated yesterday afternoon Tminimum 92.8 - placed on warming blanket  CRRT 4K bath, UF 3024  Off norepinephrine.     Objective:  Vital signs in last 24 hours:  Temp:  [92.8 F (33.8 C)-98 F (36.7 C)] 96.6 F (35.9 C) (12/04 0900) Pulse Rate:  [63-89] 88 (12/04 0900) Resp:  [0-25] 19 (12/04 0900) BP: (75-146)/(42-85) 114/57 (12/04 0900) SpO2:  [92 %-100 %] 96 % (12/04 0900) FiO2 (%):  [40 %-60 %] 40 % (12/04 0900) Weight:  [103.6 kg (228 lb 6.3 oz)] 103.6 kg (228 lb 6.3 oz) (12/04 0400)  Weight change: -2.6 kg (-5 lb 11.7 oz) Filed Weights   01/24/16 0500 01/25/16 0500 01/26/16 0400  Weight: 109.8 kg (242 lb 1 oz) 106.2 kg (234 lb 2.1 oz) 103.6 kg (228 lb 6.3 oz)    Intake/Output: I/O last 3 completed shifts: In: 1260.4 [I.V.:1120.4; Other:140] Out: 3568 [Urine:142; Other:4611]   Intake/Output this shift:  Total I/O In: 56.1 [I.V.:56.1] Out: 0   Physical Exam: General: Critically ill appearing   Head: ETT   Eyes: Eyes open, pupils equal and reactive to light  Neck: TLC RIJ  Lungs:  Scattered rhonchi bilateral, on bipap  Heart: regular  Abdomen:  obese, +distended  Extremities: 2+ peripheral edema.  Neurologic: Intubated, sedated  Skin: No lesions  Access: Right femoral temporary dialysis catheter 11/26 Dr. Isidore Moos    Basic Metabolic Panel:  Recent Labs Lab 01/24/16 0540  01/25/16 0423  01/25/16 1039 01/25/16 1454 01/25/16 1844 01/25/16 2235 01/26/16 0300 01/26/16 0427  NA  --   < > 140  < > 140 140 139 139 139  --   K  --   < > 4.7  < > 4.9 4.8 4.6 4.5 4.4  --   CL  --   < > 108  < > 106 108 107 108 109  --   CO2  --   < > 26  < > 27 28 27 26 27   --   GLUCOSE  --   < > 127*  < > 121* 128* 132* 131* 118*  --   BUN  --   < > 26*  < > 27* 28* 26* 26* 26*  --   CREATININE  --   < > 0.70  < > 0.73 0.71 0.78 0.70 0.73  --   CALCIUM  --   < > 7.9*  < > 8.3* 8.2* 8.4* 8.1* 7.9*  --    MG 1.7  --  1.6*  --   --   --   --   --   --  1.9  PHOS 2.4*  < > 2.7  < > 2.8 3.0 2.8 2.6 2.5 2.5  < > = values in this interval not displayed.  Liver Function Tests:  Recent Labs Lab 01/25/16 1039 01/25/16 1454 01/25/16 1844 01/25/16 2235 01/26/16 0300  ALBUMIN 1.3* 1.2* 1.2* 1.2* 1.1*   No results for input(s): LIPASE, AMYLASE in the last 168 hours. No results for input(s): AMMONIA in the last 168 hours.  CBC:  Recent Labs Lab 01/20/16 0501  01/22/16 0630 01/23/16 0405 01/23/16 1740 01/23/16 2305 01/24/16 0902 01/25/16 0423 01/26/16 0427  WBC 25.1*  < > 25.3* 22.4* 23.8* 20.6* 28.7* 32.9* 31.0*  NEUTROABS 23.9*  --  24.1* 21.0* 22.1*  --  27.1*  --   --   HGB 9.7*  < > 9.0* 8.7* 8.8* 6.3*  10.4* 10.4* 9.6*  HCT 29.1*  < > 26.8* 25.5* 26.1* 19.2* 30.9* 30.4* 28.3*  MCV 84.3  < > 82.0 82.4 83.9 83.2 83.0 83.1 83.6  PLT 66*  < > 105* 95* 109* 75* 105* 115* 108*  < > = values in this interval not displayed.  Cardiac Enzymes: No results for input(s): CKTOTAL, CKMB, CKMBINDEX, TROPONINI in the last 168 hours.  BNP: Invalid input(s): POCBNP  CBG:  Recent Labs Lab 01/25/16 1112 01/25/16 1552 01/25/16 1936 01/25/16 2338 01/26/16 0418  GLUCAP 118* 109* 112* 127* 115*    Microbiology: Results for orders placed or performed during the hospital encounter of 01/14/2016  Urine culture     Status: Abnormal   Collection Time: 01/14/2016  1:29 PM  Result Value Ref Range Status   Specimen Description URINE, RANDOM  Final   Special Requests NONE  Final   Culture >=100,000 COLONIES/mL ESCHERICHIA COLI (A)  Final   Report Status 01/17/2016 FINAL  Final   Organism ID, Bacteria ESCHERICHIA COLI (A)  Final      Susceptibility   Escherichia coli - MIC*    AMPICILLIN >=32 RESISTANT Resistant     CEFAZOLIN <=4 SENSITIVE Sensitive     CEFTRIAXONE <=1 SENSITIVE Sensitive     CIPROFLOXACIN <=0.25 SENSITIVE Sensitive     GENTAMICIN <=1 SENSITIVE Sensitive     IMIPENEM <=0.25  SENSITIVE Sensitive     NITROFURANTOIN <=16 SENSITIVE Sensitive     TRIMETH/SULFA <=20 SENSITIVE Sensitive     AMPICILLIN/SULBACTAM >=32 RESISTANT Resistant     PIP/TAZO <=4 SENSITIVE Sensitive     Extended ESBL NEGATIVE Sensitive     * >=100,000 COLONIES/mL ESCHERICHIA COLI  Culture, blood (routine x 2)     Status: Abnormal   Collection Time: 01/16/2016  1:29 PM  Result Value Ref Range Status   Specimen Description BLOOD RIGHT HAND  Final   Special Requests   Final    BOTTLES DRAWN AEROBIC AND ANAEROBIC 11MLAERO,8MLANA   Culture  Setup Time   Final    GRAM NEGATIVE RODS IN BOTH AEROBIC AND ANAEROBIC BOTTLES CRITICAL RESULT CALLED TO, READ BACK BY AND VERIFIED WITH: NATE COOKSON AT [place] ON 01/16/16 [place]. Performed at [place] (A)  Final   Report Status 01/19/2016 FINAL  Final   Organism ID, Bacteria ESCHERICHIA COLI  Final      Susceptibility   Escherichia coli - MIC*    AMPICILLIN >=32 RESISTANT Resistant     CEFAZOLIN <=4 SENSITIVE Sensitive     CEFEPIME <=1 SENSITIVE Sensitive     CEFTAZIDIME <=1 SENSITIVE Sensitive     CEFTRIAXONE <=1 SENSITIVE Sensitive     CIPROFLOXACIN <=0.25 SENSITIVE Sensitive     GENTAMICIN <=1 SENSITIVE Sensitive     IMIPENEM <=0.25 SENSITIVE Sensitive     TRIMETH/SULFA <=20 SENSITIVE Sensitive     AMPICILLIN/SULBACTAM >=32 RESISTANT Resistant     PIP/TAZO <=4 SENSITIVE Sensitive     Extended ESBL NEGATIVE Sensitive     * ESCHERICHIA COLI  Culture, blood (routine x 2)     Status: Abnormal   Collection Time: 01/21/2016  1:29 PM  Result Value Ref Range Status   Specimen Description BLOOD LEFT ANTECUBITAL  Final   Special Requests   Final    BOTTLES DRAWN AEROBIC AND ANAEROBIC AER 12ML ANA 11ML   Culture  Setup Time   Final    GRAM NEGATIVE RODS IN BOTH AEROBIC AND ANAEROBIC BOTTLES CRITICAL VALUE NOTED.  VALUE IS CONSISTENT WITH PREVIOUSLY REPORTED AND CALLED VALUE. GRAM POSITIVE COCCI AEROBIC BOTTLE  ONLY CRITICAL RESULT CALLED TO, READ BACK BY AND VERIFIED WITH: Charlane Ferretti @ [phone] 01/16/16 by [facility].    Culture (A)  Final    ESCHERICHIA COLI SUSCEPTIBILITIES PERFORMED ON PREVIOUS CULTURE WITHIN THE LAST 5 DAYS. STAPHYLOCOCCUS SPECIES (COAGULASE NEGATIVE) THE SIGNIFICANCE OF ISOLATING THIS ORGANISM FROM A SINGLE SET OF BLOOD CULTURES WHEN MULTIPLE SETS ARE DRAWN IS UNCERTAIN. PLEASE NOTIFY THE MICROBIOLOGY DEPARTMENT WITHIN ONE WEEK IF SPECIATION AND SENSITIVITIES ARE REQUIRED. Performed at [facility]    Report Status 01/01/2016 FINAL  Final  Blood Culture ID Panel (Reflexed)     Status: Abnormal   Collection Time: 01/21/2016  1:29 PM  Result Value Ref Range Status   Enterococcus species NOT DETECTED NOT DETECTED Final   Listeria monocytogenes NOT DETECTED NOT DETECTED Final   Staphylococcus species NOT DETECTED NOT DETECTED Final   Staphylococcus aureus NOT DETECTED NOT DETECTED Final   Streptococcus species NOT DETECTED NOT DETECTED Final   Streptococcus agalactiae NOT DETECTED NOT DETECTED Final   Streptococcus pneumoniae NOT DETECTED NOT DETECTED Final   Streptococcus pyogenes NOT DETECTED NOT DETECTED Final   Acinetobacter baumannii NOT DETECTED NOT DETECTED Final   Enterobacteriaceae species DETECTED (A) NOT DETECTED Final    Comment: CRITICAL RESULT CALLED TO, READ BACK BY AND VERIFIED WITH: NATE COOKSON AT 0556 ON 01/16/16 [place].    Enterobacter cloacae complex NOT DETECTED NOT DETECTED Final   Escherichia coli DETECTED (A) NOT DETECTED Final    Comment: CRITICAL RESULT CALLED TO, READ BACK BY AND VERIFIED WITH: NATE COOKSON AT 9767 ON 01/16/16 [place].    Klebsiella oxytoca NOT DETECTED NOT DETECTED Final   Klebsiella pneumoniae NOT DETECTED NOT DETECTED Final   Proteus species NOT DETECTED NOT DETECTED Final   Serratia marcescens NOT DETECTED NOT DETECTED Final   Carbapenem resistance NOT DETECTED NOT DETECTED Final   Haemophilus influenzae NOT DETECTED NOT DETECTED  Final   Neisseria meningitidis NOT DETECTED NOT DETECTED Final   Pseudomonas aeruginosa NOT DETECTED NOT DETECTED Final   Candida albicans NOT DETECTED NOT DETECTED Final   Candida glabrata NOT DETECTED NOT DETECTED Final   Candida krusei NOT DETECTED NOT DETECTED Final   Candida parapsilosis NOT DETECTED NOT DETECTED Final   Candida tropicalis NOT DETECTED NOT DETECTED Final  Blood Culture ID Panel (Reflexed)     Status: Abnormal   Collection Time: 01/19/2016  1:40 PM  Result Value Ref Range Status   Enterococcus species NOT DETECTED NOT DETECTED Final   Vancomycin resistance NOT DETECTED NOT DETECTED Final   Listeria monocytogenes NOT DETECTED NOT DETECTED Final   Staphylococcus species DETECTED (A) NOT DETECTED Final    Comment: CRITICAL RESULT CALLED TO, READ BACK BY AND VERIFIED WITH: Charlane Ferretti @ 3419 01/16/16 by [facility].    Staphylococcus aureus NOT DETECTED NOT DETECTED Final   Methicillin resistance NOT DETECTED NOT DETECTED Final   Streptococcus species NOT DETECTED NOT DETECTED Final   Streptococcus agalactiae NOT DETECTED NOT DETECTED Final   Streptococcus pneumoniae NOT DETECTED NOT DETECTED Final   Streptococcus pyogenes NOT DETECTED NOT DETECTED Final   Acinetobacter baumannii NOT DETECTED NOT DETECTED Final   Enterobacteriaceae species DETECTED (A) NOT DETECTED Final    Comment: CRITICAL RESULT CALLED TO, READ BACK BY AND VERIFIED WITH: Charlane Ferretti @ [phone] 01/16/16 by [facility].    Enterobacter cloacae complex NOT DETECTED NOT DETECTED Final   Escherichia coli DETECTED (A) NOT DETECTED  Final    Comment: CRITICAL RESULT CALLED TO, READ BACK BY AND VERIFIED WITH: Charlane Ferretti @ [phone] 01/16/16 by [facility].    Klebsiella oxytoca NOT DETECTED NOT DETECTED Final   Klebsiella pneumoniae NOT DETECTED NOT DETECTED Final   Proteus species NOT DETECTED NOT DETECTED Final   Serratia marcescens NOT DETECTED NOT DETECTED Final   Carbapenem resistance NOT DETECTED NOT DETECTED Final    Haemophilus influenzae NOT DETECTED NOT DETECTED Final   Neisseria meningitidis NOT DETECTED NOT DETECTED Final   Pseudomonas aeruginosa NOT DETECTED NOT DETECTED Final   Candida albicans NOT DETECTED NOT DETECTED Final   Candida glabrata NOT DETECTED NOT DETECTED Final   Candida krusei NOT DETECTED NOT DETECTED Final   Candida parapsilosis NOT DETECTED NOT DETECTED Final   Candida tropicalis NOT DETECTED NOT DETECTED Final  MRSA PCR Screening     Status: None   Collection Time: 01/06/2016  7:00 PM  Result Value Ref Range Status   MRSA by PCR NEGATIVE NEGATIVE Final    Comment:        The GeneXpert MRSA Assay (FDA approved for NASAL specimens only), is one component of a comprehensive MRSA colonization surveillance program. It is not intended to diagnose MRSA infection nor to guide or monitor treatment for MRSA infections.   C difficile quick scan w PCR reflex     Status: None   Collection Time: 01/23/16  5:18 PM  Result Value Ref Range Status   C Diff antigen NEGATIVE NEGATIVE Final   C Diff toxin NEGATIVE NEGATIVE Final   C Diff interpretation No C. difficile detected.  Final    Coagulation Studies:  Recent Labs  01/23/16 1740  LABPROT 16.1*  INR 1.28    Urinalysis: No results for input(s): COLORURINE, LABSPEC, PHURINE, GLUCOSEU, HGBUR, BILIRUBINUR, KETONESUR, PROTEINUR, UROBILINOGEN, NITRITE, LEUKOCYTESUR in the last 72 hours.  Invalid input(s): APPERANCEUR    Imaging: Dg Abd 1 View  Result Date: 01/25/2016 CLINICAL DATA:  Acute respiratory failure. Orogastric tube placement. EXAM: ABDOMEN - 1 VIEW COMPARISON:  01/25/2016 FINDINGS: New orogastric tube is seen with tip overlying the body of the stomach. Left ureteral stent and right femoral vein catheter remain in place. Mild gaseous distention of colon is again seen, suspicious for mild ileus. Left lower lobe consolidation again demonstrated. IMPRESSION: New orogastric tube tip overlying body of stomach.  Electronically Signed   By: Earle Gell M.D.   On: 01/25/2016 14:14   Dg Chest Port 1 View  Result Date: 01/26/2016 CLINICAL DATA:  Acute respiratory failure, sepsis, community-acquired pneumonia, acute renal injury. EXAM: PORTABLE CHEST 1 VIEW COMPARISON:  Portable chest x-ray of January 25, 2016 FINDINGS: The lungs are adequately inflated. The interstitial markings remain increased with areas of alveolar opacity noted in the right upper and left lobe mid and lower lung. There is some blunting of the costophrenic angles with obscuration of the left hemidiaphragm. The cardiac silhouette is top-normal in size. The pulmonary vascularity is indistinct. The endotracheal tube tip lies approximately 1.9 cm above the carina. The esophagogastric tube tip projects below the inferior margin of the image. The right internal jugular venous catheter tip projects over the midportion of the SVC. IMPRESSION: Persistent bilateral alveolar opacities compatible with pneumonia. Probable small bilateral pleural effusions. There may be associated CHF but new bone is the predominant abnormality present. The support tubes are in reasonable position. Electronically Signed   By: David  Martinique M.D.   On: 01/26/2016 07:41   Dg Chest [facility]  Result Date: 01/25/2016 CLINICAL DATA:  Acute respiratory failure. Septic shock. Intubation. EXAM: PORTABLE CHEST 1 VIEW COMPARISON:  01/25/2016 FINDINGS: Endotracheal tube tip is in the proximal right mainstem bronchus, approximately 1 cm below the level the carina. Nasogastric tube and right jugular central venous catheter are in appropriate position. Heart size is stable. Bilateral airspace disease is seen with greatest involvement in the right upper lobe and left lower lobe. Right upper lobe airspace disease shows mild worsening since previous study. No pneumothorax visualized. IMPRESSION: Low endotracheal tube position with tip in proximal right mainstem bronchus, approximately 1 cm below  the carina. Bilateral pulmonary airspace disease with greatest involvement in the right upper and left lower lobes. Right upper lobe airspace disease shows mild worsening since prior study. Critical Value/emergent results were called by telephone at the time of interpretation on 01/25/2016 at 2:42 pm to patient's ICU nurse Adam , who verbally acknowledged these results. Electronically Signed   By: Earle Gell M.D.   On: 01/25/2016 14:43   Dg Chest Port 1 View  Result Date: 01/25/2016 CLINICAL DATA:  75 year old female with history of acute respiratory failure. EXAM: PORTABLE CHEST 1 VIEW COMPARISON:  Chest x-ray 01/12/2016. FINDINGS: An endotracheal tube is in place with tip 3.6 cm above the carina. There is a right-sided internal jugular central venous catheter with tip terminating in the superior cavoatrial junction. Patchy multifocal asymmetrically distributed interstitial and airspace disease is again noted throughout the lungs bilaterally, most confluent in the right upper lobe and in the left mid lung, concerning for severe multilobar pneumonia. No definite pleural effusions. No evidence of pulmonary edema. Heart size is normal. Upper mediastinal contours are distorted by patient's rotation to the right. IMPRESSION: 1. Support apparatus, as above. 2. The appearance of the chest is again most compatible with severe multilobar pneumonia. Aeration has slightly improved in the left mid lung, but appears slightly worsened in the right upper lobe. Electronically Signed   By: Vinnie Langton M.D.   On: 01/25/2016 08:30   Dg Abd Portable 1v  Result Date: 01/25/2016 CLINICAL DATA:  Sepsis.  Acute respiratory failure. EXAM: PORTABLE ABDOMEN - 1 VIEW COMPARISON:  01/22/2016.  Portable chest obtained earlier today. FINDINGS: The nasogastric tube has been removed. A right femoral catheter is demonstrated with its tip in the inferior vena cava. A left ureteral stent is in satisfactory position. Bilateral pelvic  phleboliths. Gas in normal caliber colon. No gas is seen in the stomach or small bowel. Dense left lower lobe airspace opacity and scattered airspace opacity throughout the remainder the included lungs. Right central venous catheter tip in the region of the superior cavoatrial junction. No acute bony abnormality. IMPRESSION: 1. No acute abdominal abnormality. 2. Dense left lower lobe atelectasis or pneumonia. 3. Diffuse bilateral pneumonia or alveolar edema in the remainder the lungs at the lung bases. Electronically Signed   By: Claudie Revering M.D.   On: 01/25/2016 11:49     Medications:   . dextrose    . fentaNYL infusion INTRAVENOUS Stopped (01/26/16 0730)  . norepinephrine (LEVOPHED) Adult infusion Stopped (01/26/16 0830)  . pantoprozole (PROTONIX) infusion 8 mg/hr (01/26/16 0800)  . propofol (DIPRIVAN) infusion 10 mcg/kg/min (01/26/16 0949)  . pureflow 2,500 mL/hr at 01/25/16 2300   . aspirin  81 mg Oral Daily  . atorvastatin  80 mg Oral QPM  . B-complex with vitamin C  1 tablet Oral Daily  . cefTRIAXone  2 g Intravenous Q24H  . chlorhexidine gluconate (MEDLINE KIT)  15 mL Mouth Rinse BID  . cholecalciferol  1,000 Units Oral Daily  . insulin aspart  2-6 Units Subcutaneous Q4H  . insulin glargine  17 Units Subcutaneous Q24H  . ipratropium-albuterol  3 mL Nebulization Q6H  . mouth rinse  15 mL Mouth Rinse QID  . methylPREDNISolone (SOLU-MEDROL) injection  40 mg Intravenous Q12H  . potassium chloride  20 mEq Oral Daily  . sodium chloride flush  10-40 mL Intracatheter Q12H   acetaminophen, bisacodyl, dextrose, heparin, ipratropium-albuterol, metoprolol, midazolam, ondansetron **OR** ondansetron (ZOFRAN) IV, sennosides, sodium chloride flush, traMADol, vecuronium  Assessment/ Plan:  75 y.o. Caucasian female with medical problems of diabetes, hypertension, atrial fibrillation, was admitted on 12/24/2015 with sepsis from urinary source and bilateral pneumonia.   1. Acute renal failure on  chronic kidney disease stage III: with anuric urine output. On CRRT Acute renal failure from ATN, obstructive uropathy, hypotension, sepsis, and shock Status post left  ureteral stent placement 11/26 by Dr. Junious Silk Baseline creatinine of 1.1, GFR 49 from 12/04/15.  Chronic kidney disease secondary to diabetes, hypertension  History of microalbuminuria - discontinue potassium chloride - recommend exchanging dialysis catheter if it truly has been in since 11/26.   2. Bilateral pneumonia with Acute respiratory failure: reintubated on 12/3.  - continue mechanical ventilation - solumedrol  3. Sepsis. Escherichia coli in blood and urine.  Off vasopressors - ceftriaxone  4. Anemia and thrombocytopenia: status post PRBC transfusion on 12/1    LOS: [place], [place] 12/4/201710:03 AM

## 2016-01-26 NOTE — Consult Note (Signed)
Patient passing dark stools with some reddish blood in it. Will continue iv Protonix as she is likely to have stress ulcers due to severe illness. Check CBC in morning, hgb 9.6 yesterday.

## 2016-01-27 ENCOUNTER — Inpatient Hospital Stay: Payer: PPO

## 2016-01-27 DIAGNOSIS — J189 Pneumonia, unspecified organism: Secondary | ICD-10-CM

## 2016-01-27 DIAGNOSIS — N390 Urinary tract infection, site not specified: Secondary | ICD-10-CM

## 2016-01-27 LAB — RENAL FUNCTION PANEL
Albumin: 1.2 g/dL — ABNORMAL LOW (ref 3.5–5.0)
Anion gap: 3 — ABNORMAL LOW (ref 5–15)
BUN: 32 mg/dL — ABNORMAL HIGH (ref 6–20)
CALCIUM: 8.1 mg/dL — AB (ref 8.9–10.3)
CHLORIDE: 109 mmol/L (ref 101–111)
CO2: 28 mmol/L (ref 22–32)
CREATININE: 0.9 mg/dL (ref 0.44–1.00)
GFR calc Af Amer: >60 mL/min (ref >60)
GFR calc non Af Amer: >60 mL/min (ref >60)
GLUCOSE: 196 mg/dL — AB (ref 65–99)
Phosphorus: 1.9 mg/dL — ABNORMAL LOW (ref 2.5–4.6)
Potassium: 4.6 mmol/L (ref 3.5–5.1)
SODIUM: 140 mmol/L (ref 135–145)

## 2016-01-27 LAB — GLUCOSE, CAPILLARY
GLUCOSE-CAPILLARY: 178 mg/dL — AB (ref 65–99)
GLUCOSE-CAPILLARY: 198 mg/dL — AB (ref 65–99)
Glucose-Capillary: 170 mg/dL — ABNORMAL HIGH (ref 65–99)
Glucose-Capillary: 172 mg/dL — ABNORMAL HIGH (ref 65–99)
Glucose-Capillary: 204 mg/dL — ABNORMAL HIGH (ref 65–99)
Glucose-Capillary: 234 mg/dL — ABNORMAL HIGH (ref 65–99)

## 2016-01-27 LAB — MAGNESIUM: MAGNESIUM: 1.8 mg/dL (ref 1.7–2.4)

## 2016-01-27 LAB — HEPATIC FUNCTION PANEL
ALBUMIN: 1.2 g/dL — AB (ref 3.5–5.0)
ALT: 92 U/L — AB (ref 14–54)
AST: 142 U/L — AB (ref 15–41)
Alkaline Phosphatase: 591 U/L — ABNORMAL HIGH (ref 38–126)
BILIRUBIN DIRECT: 0.6 mg/dL — AB (ref 0.1–0.5)
BILIRUBIN TOTAL: 1.6 mg/dL — AB (ref 0.3–1.2)
Indirect Bilirubin: 1 mg/dL — ABNORMAL HIGH (ref 0.3–0.9)
Total Protein: 5.2 g/dL — ABNORMAL LOW (ref 6.5–8.1)

## 2016-01-27 LAB — CBC
HCT: 26 % — ABNORMAL LOW (ref 35.0–47.0)
HEMOGLOBIN: 8.9 g/dL — AB (ref 12.0–16.0)
MCH: 29.2 pg (ref 26.0–34.0)
MCHC: 34.1 g/dL (ref 32.0–36.0)
MCV: 85.5 fL (ref 80.0–100.0)
Platelets: 87 10*3/uL — ABNORMAL LOW (ref 150–440)
RBC: 3.04 MIL/uL — ABNORMAL LOW (ref 3.80–5.20)
RDW: 16.2 % — ABNORMAL HIGH (ref 11.5–14.5)
WBC: 20.7 10*3/uL — ABNORMAL HIGH (ref 3.6–11.0)

## 2016-01-27 LAB — BASIC METABOLIC PANEL
Anion gap: 4 — ABNORMAL LOW (ref 5–15)
BUN: 33 mg/dL — AB (ref 6–20)
CHLORIDE: 106 mmol/L (ref 101–111)
CO2: 28 mmol/L (ref 22–32)
CREATININE: 0.85 mg/dL (ref 0.44–1.00)
Calcium: 8.2 mg/dL — ABNORMAL LOW (ref 8.9–10.3)
GFR calc Af Amer: >60 mL/min (ref >60)
GFR calc non Af Amer: >60 mL/min (ref >60)
GLUCOSE: 190 mg/dL — AB (ref 65–99)
Potassium: 4.7 mmol/L (ref 3.5–5.1)
SODIUM: 138 mmol/L (ref 135–145)

## 2016-01-27 MED ORDER — INSULIN ASPART 100 UNIT/ML ~~LOC~~ SOLN
0.0000 [IU] | SUBCUTANEOUS | Status: DC
  Administered 2016-01-28: 3 [IU] via SUBCUTANEOUS
  Administered 2016-01-28 (×2): 5 [IU] via SUBCUTANEOUS
  Filled 2016-01-27: qty 5
  Filled 2016-01-27: qty 3
  Filled 2016-01-27: qty 5

## 2016-01-27 MED ORDER — ALBUMIN HUMAN 25 % IV SOLN
12.5000 g | Freq: Four times a day (QID) | INTRAVENOUS | Status: DC
  Administered 2016-01-27 – 2016-01-28 (×4): 12.5 g via INTRAVENOUS
  Filled 2016-01-27 (×11): qty 50

## 2016-01-27 MED ORDER — INSULIN ASPART 100 UNIT/ML ~~LOC~~ SOLN: 0.0000 [IU] | Freq: Three times a day (TID) | SUBCUTANEOUS | Status: DC

## 2016-01-27 MED ORDER — DEXTROSE 5 % IV SOLN
15.0000 mmol | Freq: Once | INTRAVENOUS | Status: AC
  Administered 2016-01-27: 15 mmol via INTRAVENOUS
  Filled 2016-01-27: qty 5

## 2016-01-27 MED ORDER — FENTANYL CITRATE (PF) 2500 MCG/50ML IJ SOLN
50.0000 ug/h | INTRAMUSCULAR | Status: DC
  Administered 2016-01-27: 50 ug/h via INTRAVENOUS
  Administered 2016-01-28: 125 ug/h via INTRAVENOUS
  Filled 2016-01-27 (×2): qty 50

## 2016-01-27 NOTE — Progress Notes (Signed)
PULMONARY / CRITICAL CARE MEDICINE   Name: Heidi Villegas MRN: [id number] DOB: Jan 17, 1941    ADMISSION DATE:  12/27/2015   ASSESSMENT / PLAN:  75 y/o caucasian female presenting with urosepsis, septic shock and acute hypoxic respiratory failure necessitating intubation. CT abdomen 11/25 shows obstructing left renal calculi and hydronephrosis. Taken to the OR 11/26 for a cystoscopy with left retrogate pyelogram, ureteral stent and foley placement. Severe hypercarbia post op.  Extubated 12/4, then reintubated same day.   Patient with multiorgan failure, critically ill with e coli bacteremia, course complicated by GI bleeding, acute renal failure on CRRT.  PULMONARY A: Acute hypoxic respiratory failure possibly related to  septic shock-Failed BiPAP and emergently intubated 11/25 due to persistent hypoxemia, tachypnea and tachycardia with worsening mental status-severe hypercarbia post op --ABG 7.49/39/81/29.7, consistent with Metabolic and respiratory acidosis.  --Failed extubation on 12/4 and reintubated.  --CXR images 12/5 reviewed; continued volume overload, slightly improved from yesterday.  --Vent settings PRVC/18/500/10/40% P:   Full vent support wean as tolerated; decreased PEEP to 8 today, decrease rate to 14 Prn CXR and ABG Continue Nebulized bronchodilators Continue IV steroids wean as tolerated VAP Bundle  CARDIOVASCULAR A:  --Septic shock secondary to urosepsis-now weaned off of pressors. --Afib with RVR, controlled on amiodarone infusion Elevated troponin likely demand ischemia secondary to acute respiratory failure and septic shock  P:  Continuous telemetry Continue Amiodarone gtt Cardiology consulted appreciate input  RENAL A:   AKI likely secondary to sepsis-improving Left hydronephrosis with obstructing renal calculi on CT S/P STAT cystoscopy with stent placement 12/31/2015  Right renal mass-not apparent on CT abdomen P:   Follow BMET Avoid nephrotoxic  drugs Replace electrolytes per ICU protocol Urology consulted appreciate input Nephrology consulted appreciate input Continue CRRT per nephrology Ultrafiltration rate 125 ml/hr  GASTROINTESTINAL A:    --Abdominal distension concerning for?illeus/bowel ischemia --Acute GI bleed-GI consulted, patient is now on Protonix drip. --Elevated alkaline phosphatase P:   -Check abdominal ultrasound, abdominal x-ray. Continue Protonix gtt GI consulted appreciate input Monitor for s/sx of bleeding  HEMATOLOGIC A:   Anemia-acute blood loss superimposed on anemia of chronic disease  P:  No chemical VTE prophylaxis  SCDs for VTE prophylaxis Trend CBC's Transfuse for hgb <7 Monitor for further rectal bleeding  INFECTIOUS A:   Urosepsis with septic shock. P:   Trend WBC's and monitor fever curve Continue Ceftriaxone  Follow cultures  CULTURES: 11/23 BC; GNR; PCR- enterobacter, Ecoli in both aerobic and anaerobic bottles  11/23 UC; Escherichia coli 11/24 Flu negative 11/23 Cdiff negative   ANTIBIOTICS: 11/23 Vancomycin>>11/24 11/23 Zosyn>>11/23 11/25 Unasyn x 1dose 11/24 Meropenem>>11/27 11/27 Ceftriaxone>> day 9 on 12/5  ENDOCRINE A:   Diabetes Melitus P:   SSI Continue lantus CBG's q4hrs  NEUROLOGIC A:   Acute metabolic encephalopathy likely secondary to sepsis P:   RASS score 0 to -1 Fentanyl and  Versed to maintain RASS goal WUA daily   LINES/TUBES: 01/16/16 Right IJ>> 11/25 ETT>> Now day 10 12/4 Right femoral dual lumen HD catheter 11/26>>  -----------------------------------------------------------------------  SUBJECTIVE:  Pt had one episode of diarrhea with moderate amount of bright red blood in stool no other signs of bleeding.  Pt remains intubated  VITAL SIGNS: BP 100/60   Pulse (!) 107   Temp 97.6 F (36.4 C) (Oral)   Resp (!) 30   Ht 5\' 1"  (1.549 m)   Wt 110.2 kg (242 lb 15.2 oz)   SpO2 100%   BMI 45.90 kg/m   HEMODYNAMICS:  CVP:  [6  mmHg] 6 mmHg  VENTILATOR SETTINGS: Vent Mode: PRVC FiO2 (%):  [80 %-100 %]40 % Set Rate:  [30 bmp] 30 bmp Vt Set:  [500 mL] 500 mL PEEP:  [15 cmH20] 15 cmH20  INTAKE / OUTPUT: I/O last 3 completed shifts: In: 4642.4 [I.V.:2662.4; Other:120; NG/GT:1860] Out: [id number] [Urine:355; JK:[id number]; Stool:75]  PHYSICAL EXAMINATION: General:  Acutely ill Caucasian female, well nourished HEENT:  Atraumatic, normocephalic, no discharge, no JVD appreciated Neuro: lightly sedated, follows commands, PERRL Cardiovascular: irregularly irregular, no M/R/G noted, 2+ generalized edema Lungs: Diminished bibasilar with expiratory wheezes, even, non labored Abdomen:  Soft, obese, nontender, active bowel sounds Musculoskeletal:  No inflammation/deformity noted Skin:  Grossly intact  STUDIES:  11/23 Renal ultrasound>>Mild to moderate left hydronephrosis without obstructing cause identified, right renal mass.  11/25 2-D echo>>EF 40 to 45% 11/25 CT Abd Pelvis>>Bilateral mild perinephric stranding. There is mild to moderate left hydronephrosis. Axial image 43 there is 6 mm calcified obstructive calculus in proximal left ureter at the level of upper endplate of L4 vertebral body. There is a Foley catheter within decompressed urinary bladder. No small bowel obstruction.  Few diverticula are noted descending colon. No evidence of acute colitis or diverticulitis. Status post hysterectomy. Degenerative changes thoracolumbar spine. There is about 4 mm anterolisthesis L5 on S1 vertebral body. Bilateral lower lobe posterior consolidation with air bronchogram highly suspicious for pneumonia.  SIGNIFICANT EVENTS: 11/23-Pt admitted with severe sepsis secondary to pylonephritis 11/24-Pt iin severe respiratory distress , requiring BiPAP 11/25-Pt failed Bipap requiring mechanical intubation 11/26- Dialysis per nephro 11/30-remains on CRRT 12/1 on CRRT fio2 50%, PEEP 15; Gi bleed.  12/2 40% PEEP 5, start SAT/SBT 12/3  extubated, then reintubated.   Update: No family at bedside to update 01/27/2016  -Marda Stalker, M.D.  01/27/2016   Critical Care Attestation.  I have personally obtained a history, examined the patient, evaluated laboratory and imaging results, formulated the assessment and plan and placed orders. The Patient requires high complexity decision making for assessment and support, frequent evaluation and titration of therapies, application of advanced monitoring technologies and extensive interpretation of multiple databases. The patient has critical illness that could lead imminently to failure of 1 or more organ systems and requires the highest level of physician preparedness to intervene.  Critical Care Time devoted to patient care services described in this note is 45 minutes and is exclusive of time spent in procedures.

## 2016-01-27 NOTE — Progress Notes (Signed)
Per Dr Tiffany Kocher check gastric residuals QShift

## 2016-01-27 NOTE — Progress Notes (Addendum)
CRRT stopped at 1010 with manual rinse back, per Dr Rolly Salter.  He will attempt a short HD cycle tomorrow, would also like for femoral cath to be replaced.  If patient cannot tolerate HD tomorrow family will be contacted for decisions.    No further bloody stool at this time, pt resting quietly on the vent, tolerating PEEP of 5

## 2016-01-27 NOTE — Progress Notes (Signed)
Pharmacy Antibiotic Note/ Electrolyte Monitoring/ CRRT Medication Adjustment  Heidi Villegas is a 75 y.o. female admitted on 12/31/2015 with sepsis.  Pharmacy has been consulted for ceftriaxone dosing.  Pharmacy also consulted to assist in electrolyte monitoring and to adjust medications based on CRRT. Pt started CRRT 11/28 and appears to be doing better 11/29  Plan: 1. Antibiotics Continue ceftriaxone 2 g IV q 24 hours for E.coli bacteremia. Stop date to total 14 days of abx therapy.  2. Electrolytes All electrolytes wnl. Will give NaPhos 15 mmol x 1. Will recheck electrolytes with am labs.    3. CRRT No medication adjustments needed at this time. Per nephrology, pt will continue on CRRT for now. Will continue to monitor.   Height: 5\' 1"  (154.9 cm) Weight: 233 lb 11 oz (106 kg) IBW/kg (Calculated) : 47.8  Temp (24hrs), Avg:98.3 F (36.8 C), Min:96.8 F (36 C), Max:99.5 F (37.5 C)   Recent Labs Lab 01/23/16 2305  01/24/16 0902  01/25/16 0423  01/25/16 1844 01/25/16 2235 01/26/16 0300 01/26/16 0427 01/26/16 0949 01/27/16 0445  WBC 20.6*  --  28.7*  --  32.9*  --   --   --   --  31.0*  --  20.7*  CREATININE 0.62  < >  --   < > 0.70  < > 0.78 0.70 0.73  --  0.78 0.85  0.90  < > = values in this interval not displayed.  Estimated Creatinine Clearance: 62.5 mL/min (by C-G formula based on SCr of 0.9 mg/dL).    Allergies  Allergen Reactions  . Codeine Nausea And Vomiting  . Sulfa Antibiotics     Antimicrobials this admission: Piperacillin/tazobactam 11/23 >> 11/24 Vancomycin 11/23 >> 11/24, 11/25 >> 11/26 Meropenem 11/24 >> 11/27 Ceftriaxone 11/27 >>  Microbiology results: 11/23 BCx: E coli 2/2, staph 1/2, S ceftriaxone 11/23 UCx: E coli resistant to ampicillin and amp/sulbactam 11/23 Sputum: pending  11/23 MRSA PCR: negative  Thank you for allowing pharmacy to be a part of this patient's care.  Larene Beach [facility] Clinical Pharmacist 01/27/2016 9:43  AM

## 2016-01-27 NOTE — Progress Notes (Signed)
[facility]  ROUNDING NOTE   Subjective:   Started on amiodarone last night for new onset atrial fibrillation  CRRT 4K bath, UF 2040  Off norepinephrine. Given for a few hours last night.     Objective:  Vital signs in last 24 hours:  Temp:  [96.8 F (36 C)-99.5 F (37.5 C)] 98.7 F (37.1 C) (12/05 0738) Pulse Rate:  [85-127] 104 (12/05 0800) Resp:  [16-24] 21 (12/05 0800) BP: (87-121)/(47-76) 112/64 (12/05 0800) SpO2:  [91 %-99 %] 95 % (12/05 0800) FiO2 (%):  [40 %] 40 % (12/05 0800) Weight:  [106 kg (233 lb 11 oz)] 106 kg (233 lb 11 oz) (12/05 0300)  Weight change: 2.4 kg (5 lb 4.7 oz) Filed Weights   01/25/16 0500 01/26/16 0400 01/27/16 0300  Weight: 106.2 kg (234 lb 2.1 oz) 103.6 kg (228 lb 6.3 oz) 106 kg (233 lb 11 oz)    Intake/Output: I/O last 3 completed shifts: In: 2984.1 [I.V.:1933.1; Other:85; NG/GT:916; IV Piggyback:50] Out: 3622 [Urine:35; Other:3587]   Intake/Output this shift:  Total I/O In: 45 [NG/GT:45] Out: 119 [Other:119]  Physical Exam: General: Critically ill   Head: ETT   Eyes: Eyes open, pupils equal and reactive to light  Neck: TLC RIJ  Lungs:  Bilateral rhonchi, vent assisted PRVC FIO2 40%  Heart: Tachycardia, irregular  Abdomen:  obese, +distended  Extremities: 2+ peripheral edema.  Neurologic: Intubated, sedated  Skin: No lesions  Access: Right femoral temporary dialysis catheter 11/26 Dr. Isidore Moos    Basic Metabolic Panel:  Recent Labs Lab 01/24/16 0540  01/25/16 0423  01/25/16 1844 01/25/16 2235 01/26/16 0300 01/26/16 0427 01/26/16 0949 01/27/16 0445  NA  --   < > 140  < > 139 139 139  --  138 138  140  K  --   < > 4.7  < > 4.6 4.5 4.4  --  4.9 4.7  4.6  CL  --   < > 108  < > 107 108 109  --  107 106  109  CO2  --   < > 26  < > _0 --  _1 GLUCOSE  --   < > 127*  < > 132* 131* 118*  --  109* 190*  196*  BUN  --   < > 26*  < > 26* 26* 26*  --  27* 33*  32*  CREATININE  --   < > 0.70   < > 0.78 0.70 0.73  --  0.78 0.85  0.90  CALCIUM  --   < > 7.9*  < > 8.4* 8.1* 7.9*  --  8.2* 8.2*  8.1*  MG 1.7  --  1.6*  --   --   --   --  1.9  --  1.8  PHOS 2.4*  < > 2.7  < > 2.8 2.6 2.5 2.5 2.4* 1.9*  < > = values in this interval not displayed.  Liver Function Tests:  Recent Labs Lab 01/25/16 1844 01/25/16 2235 01/26/16 0300 01/26/16 0949 01/27/16 0445  AST  --   --   --   --  142*  ALT  --   --   --   --  92*  ALKPHOS  --   --   --   --  591*  BILITOT  --   --   --   --  1.6*  PROT  --   --   --   --  5.2*  ALBUMIN 1.2* 1.2* 1.1* 1.1* 1.2*  1.2*   No results for input(s): LIPASE, AMYLASE in the last 168 hours. No results for input(s): AMMONIA in the last 168 hours.  CBC:  Recent Labs Lab 01/22/16 0630 01/23/16 0405 01/23/16 1740 01/23/16 2305 01/24/16 0902 01/25/16 0423 01/26/16 0427 01/27/16 0445  WBC 25.3* 22.4* 23.8* 20.6* 28.7* 32.9* 31.0* 20.7*  NEUTROABS 24.1* 21.0* 22.1*  --  27.1*  --   --   --   HGB 9.0* 8.7* 8.8* 6.3* 10.4* 10.4* 9.6* 8.9*  HCT 26.8* 25.5* 26.1* 19.2* 30.9* 30.4* 28.3* 26.0*  MCV 82.0 82.4 83.9 83.2 83.0 83.1 83.6 85.5  PLT 105* 95* 109* 75* 105* 115* 108* 87*    Cardiac Enzymes: No results for input(s): CKTOTAL, CKMB, CKMBINDEX, TROPONINI in the last 168 hours.  BNP: Invalid input(s): POCBNP  CBG:  Recent Labs Lab 01/26/16 1613 01/26/16 2000 01/26/16 2354 01/27/16 0356 01/27/16 0721  GLUCAP 145* 167* 207* 178* 198*    Microbiology: Results for orders placed or performed during the hospital encounter of 12/30/2015  Urine culture     Status: Abnormal   Collection Time: 01/05/2016  1:29 PM  Result Value Ref Range Status   Specimen Description URINE, RANDOM  Final   Special Requests NONE  Final   Culture >=100,000 COLONIES/mL ESCHERICHIA COLI (A)  Final   Report Status 01/17/2016 FINAL  Final   Organism ID, Bacteria ESCHERICHIA COLI (A)  Final      Susceptibility   Escherichia coli - MIC*    AMPICILLIN >=32  RESISTANT Resistant     CEFAZOLIN <=4 SENSITIVE Sensitive     CEFTRIAXONE <=1 SENSITIVE Sensitive     CIPROFLOXACIN <=0.25 SENSITIVE Sensitive     GENTAMICIN <=1 SENSITIVE Sensitive     IMIPENEM <=0.25 SENSITIVE Sensitive     NITROFURANTOIN <=16 SENSITIVE Sensitive     TRIMETH/SULFA <=20 SENSITIVE Sensitive     AMPICILLIN/SULBACTAM >=32 RESISTANT Resistant     PIP/TAZO <=4 SENSITIVE Sensitive     Extended ESBL NEGATIVE Sensitive     * >=100,000 COLONIES/mL ESCHERICHIA COLI  Culture, blood (routine x 2)     Status: Abnormal   Collection Time: 01/21/2016  1:29 PM  Result Value Ref Range Status   Specimen Description BLOOD RIGHT HAND  Final   Special Requests   Final    BOTTLES DRAWN AEROBIC AND ANAEROBIC 11MLAERO,8MLANA   Culture  Setup Time   Final    GRAM NEGATIVE RODS IN BOTH AEROBIC AND ANAEROBIC BOTTLES CRITICAL RESULT CALLED TO, READ BACK BY AND VERIFIED WITH: NATE COOKSON AT [place] ON 01/16/16 [place]. Performed at [place] (A)  Final   Report Status 01/19/2016 FINAL  Final   Organism ID, Bacteria ESCHERICHIA COLI  Final      Susceptibility   Escherichia coli - MIC*    AMPICILLIN >=32 RESISTANT Resistant     CEFAZOLIN <=4 SENSITIVE Sensitive     CEFEPIME <=1 SENSITIVE Sensitive     CEFTAZIDIME <=1 SENSITIVE Sensitive     CEFTRIAXONE <=1 SENSITIVE Sensitive     CIPROFLOXACIN <=0.25 SENSITIVE Sensitive     GENTAMICIN <=1 SENSITIVE Sensitive     IMIPENEM <=0.25 SENSITIVE Sensitive     TRIMETH/SULFA <=20 SENSITIVE Sensitive     AMPICILLIN/SULBACTAM >=32 RESISTANT Resistant     PIP/TAZO <=4 SENSITIVE Sensitive     Extended ESBL NEGATIVE Sensitive     * ESCHERICHIA COLI  Culture, blood (routine x 2)  Status: Abnormal   Collection Time: 01/17/2016  1:29 PM  Result Value Ref Range Status   Specimen Description BLOOD LEFT ANTECUBITAL  Final   Special Requests   Final    BOTTLES DRAWN AEROBIC AND ANAEROBIC AER 12ML ANA 11ML   Culture  Setup  Time   Final    GRAM NEGATIVE RODS IN BOTH AEROBIC AND ANAEROBIC BOTTLES CRITICAL VALUE NOTED.  VALUE IS CONSISTENT WITH PREVIOUSLY REPORTED AND CALLED VALUE. GRAM POSITIVE COCCI AEROBIC BOTTLE ONLY CRITICAL RESULT CALLED TO, READ BACK BY AND VERIFIED WITH: Charlane Ferretti @ 0349 01/16/16 by [facility].    Culture (A)  Final    ESCHERICHIA COLI SUSCEPTIBILITIES PERFORMED ON PREVIOUS CULTURE WITHIN THE LAST 5 DAYS. STAPHYLOCOCCUS SPECIES (COAGULASE NEGATIVE) THE SIGNIFICANCE OF ISOLATING THIS ORGANISM FROM A SINGLE SET OF BLOOD CULTURES WHEN MULTIPLE SETS ARE DRAWN IS UNCERTAIN. PLEASE NOTIFY THE MICROBIOLOGY DEPARTMENT WITHIN ONE WEEK IF SPECIATION AND SENSITIVITIES ARE REQUIRED. Performed at [facility]    Report Status 12/28/2015 FINAL  Final  Blood Culture ID Panel (Reflexed)     Status: Abnormal   Collection Time: 01/04/2016  1:29 PM  Result Value Ref Range Status   Enterococcus species NOT DETECTED NOT DETECTED Final   Listeria monocytogenes NOT DETECTED NOT DETECTED Final   Staphylococcus species NOT DETECTED NOT DETECTED Final   Staphylococcus aureus NOT DETECTED NOT DETECTED Final   Streptococcus species NOT DETECTED NOT DETECTED Final   Streptococcus agalactiae NOT DETECTED NOT DETECTED Final   Streptococcus pneumoniae NOT DETECTED NOT DETECTED Final   Streptococcus pyogenes NOT DETECTED NOT DETECTED Final   Acinetobacter baumannii NOT DETECTED NOT DETECTED Final   Enterobacteriaceae species DETECTED (A) NOT DETECTED Final    Comment: CRITICAL RESULT CALLED TO, READ BACK BY AND VERIFIED WITH: NATE COOKSON AT 0556 ON 01/16/16 [place].    Enterobacter cloacae complex NOT DETECTED NOT DETECTED Final   Escherichia coli DETECTED (A) NOT DETECTED Final    Comment: CRITICAL RESULT CALLED TO, READ BACK BY AND VERIFIED WITH: NATE COOKSON AT 1791 ON 01/16/16 [place].    Klebsiella oxytoca NOT DETECTED NOT DETECTED Final   Klebsiella pneumoniae NOT DETECTED NOT DETECTED Final   Proteus  species NOT DETECTED NOT DETECTED Final   Serratia marcescens NOT DETECTED NOT DETECTED Final   Carbapenem resistance NOT DETECTED NOT DETECTED Final   Haemophilus influenzae NOT DETECTED NOT DETECTED Final   Neisseria meningitidis NOT DETECTED NOT DETECTED Final   Pseudomonas aeruginosa NOT DETECTED NOT DETECTED Final   Candida albicans NOT DETECTED NOT DETECTED Final   Candida glabrata NOT DETECTED NOT DETECTED Final   Candida krusei NOT DETECTED NOT DETECTED Final   Candida parapsilosis NOT DETECTED NOT DETECTED Final   Candida tropicalis NOT DETECTED NOT DETECTED Final  Blood Culture ID Panel (Reflexed)     Status: Abnormal   Collection Time: 01/14/2016  1:40 PM  Result Value Ref Range Status   Enterococcus species NOT DETECTED NOT DETECTED Final   Vancomycin resistance NOT DETECTED NOT DETECTED Final   Listeria monocytogenes NOT DETECTED NOT DETECTED Final   Staphylococcus species DETECTED (A) NOT DETECTED Final    Comment: CRITICAL RESULT CALLED TO, READ BACK BY AND VERIFIED WITH: Charlane Ferretti @ 5056 01/16/16 by [facility]    Staphylococcus aureus NOT DETECTED NOT DETECTED Final   Methicillin resistance NOT DETECTED NOT DETECTED Final   Streptococcus species NOT DETECTED NOT DETECTED Final   Streptococcus agalactiae NOT DETECTED NOT DETECTED Final   Streptococcus pneumoniae NOT DETECTED NOT  DETECTED Final   Streptococcus pyogenes NOT DETECTED NOT DETECTED Final   Acinetobacter baumannii NOT DETECTED NOT DETECTED Final   Enterobacteriaceae species DETECTED (A) NOT DETECTED Final    Comment: CRITICAL RESULT CALLED TO, READ BACK BY AND VERIFIED WITH: Charlane Ferretti @ 8938 01/16/16 by [facility].    Enterobacter cloacae complex NOT DETECTED NOT DETECTED Final   Escherichia coli DETECTED (A) NOT DETECTED Final    Comment: CRITICAL RESULT CALLED TO, READ BACK BY AND VERIFIED WITH: Charlane Ferretti @ 1017 01/16/16 by [facility].    Klebsiella oxytoca NOT DETECTED NOT DETECTED Final   Klebsiella pneumoniae NOT  DETECTED NOT DETECTED Final   Proteus species NOT DETECTED NOT DETECTED Final   Serratia marcescens NOT DETECTED NOT DETECTED Final   Carbapenem resistance NOT DETECTED NOT DETECTED Final   Haemophilus influenzae NOT DETECTED NOT DETECTED Final   Neisseria meningitidis NOT DETECTED NOT DETECTED Final   Pseudomonas aeruginosa NOT DETECTED NOT DETECTED Final   Candida albicans NOT DETECTED NOT DETECTED Final   Candida glabrata NOT DETECTED NOT DETECTED Final   Candida krusei NOT DETECTED NOT DETECTED Final   Candida parapsilosis NOT DETECTED NOT DETECTED Final   Candida tropicalis NOT DETECTED NOT DETECTED Final  MRSA PCR Screening     Status: None   Collection Time: 12/24/2015  7:00 PM  Result Value Ref Range Status   MRSA by PCR NEGATIVE NEGATIVE Final    Comment:        The GeneXpert MRSA Assay (FDA approved for NASAL specimens only), is one component of a comprehensive MRSA colonization surveillance program. It is not intended to diagnose MRSA infection nor to guide or monitor treatment for MRSA infections.   C difficile quick scan w PCR reflex     Status: None   Collection Time: 01/23/16  5:18 PM  Result Value Ref Range Status   C Diff antigen NEGATIVE NEGATIVE Final   C Diff toxin NEGATIVE NEGATIVE Final   C Diff interpretation No C. difficile detected.  Final    Coagulation Studies: No results for input(s): LABPROT, INR in the last 72 hours.  Urinalysis: No results for input(s): COLORURINE, LABSPEC, PHURINE, GLUCOSEU, HGBUR, BILIRUBINUR, KETONESUR, PROTEINUR, UROBILINOGEN, NITRITE, LEUKOCYTESUR in the last 72 hours.  Invalid input(s): APPERANCEUR    Imaging: Dg Chest 1 View  Result Date: 01/27/2016 CLINICAL DATA:  Septic shock. Community acquired pneumonia. Acute respiratory failure. On ventilator. EXAM: CHEST 1 VIEW COMPARISON:  01/26/2016 FINDINGS: Support apparatus remains in stable position. No evidence of pneumothorax. Bilateral heterogeneous airspace disease  with greatest involvement of the right upper and lower lobes shows no significant change allowing for differences in radiographic technique. Heart size remains stable . IMPRESSION: No significant interval change in bilateral airspace disease. Electronically Signed   By: Earle Gell M.D.   On: 01/27/2016 07:06   Dg Abd 1 View  Result Date: 01/25/2016 CLINICAL DATA:  Acute respiratory failure. Orogastric tube placement. EXAM: ABDOMEN - 1 VIEW COMPARISON:  01/25/2016 FINDINGS: New orogastric tube is seen with tip overlying the body of the stomach. Left ureteral stent and right femoral vein catheter remain in place. Mild gaseous distention of colon is again seen, suspicious for mild ileus. Left lower lobe consolidation again demonstrated. IMPRESSION: New orogastric tube tip overlying body of stomach. Electronically Signed   By: Earle Gell M.D.   On: 01/25/2016 14:14   Dg Chest Port 1 View  Result Date: 01/26/2016 CLINICAL DATA:  Acute respiratory failure, sepsis, community-acquired pneumonia, acute renal injury.  EXAM: PORTABLE CHEST 1 VIEW COMPARISON:  Portable chest x-ray of January 25, 2016 FINDINGS: The lungs are adequately inflated. The interstitial markings remain increased with areas of alveolar opacity noted in the right upper and left lobe mid and lower lung. There is some blunting of the costophrenic angles with obscuration of the left hemidiaphragm. The cardiac silhouette is top-normal in size. The pulmonary vascularity is indistinct. The endotracheal tube tip lies approximately 1.9 cm above the carina. The esophagogastric tube tip projects below the inferior margin of the image. The right internal jugular venous catheter tip projects over the midportion of the SVC. IMPRESSION: Persistent bilateral alveolar opacities compatible with pneumonia. Probable small bilateral pleural effusions. There may be associated CHF but new bone is the predominant abnormality present. The support tubes are in reasonable  position. Electronically Signed   By: David  Martinique M.D.   On: 01/26/2016 07:41   Dg Chest Port 1 View  Result Date: 01/25/2016 CLINICAL DATA:  Acute respiratory failure. Septic shock. Intubation. EXAM: PORTABLE CHEST 1 VIEW COMPARISON:  01/25/2016 FINDINGS: Endotracheal tube tip is in the proximal right mainstem bronchus, approximately 1 cm below the level the carina. Nasogastric tube and right jugular central venous catheter are in appropriate position. Heart size is stable. Bilateral airspace disease is seen with greatest involvement in the right upper lobe and left lower lobe. Right upper lobe airspace disease shows mild worsening since previous study. No pneumothorax visualized. IMPRESSION: Low endotracheal tube position with tip in proximal right mainstem bronchus, approximately 1 cm below the carina. Bilateral pulmonary airspace disease with greatest involvement in the right upper and left lower lobes. Right upper lobe airspace disease shows mild worsening since prior study. Critical Value/emergent results were called by telephone at the time of interpretation on 01/25/2016 at 2:42 pm to patient's ICU nurse Adam , who verbally acknowledged these results. Electronically Signed   By: Earle Gell M.D.   On: 01/25/2016 14:43     Medications:   . amiodarone 30 mg/hr (01/27/16 0700)  . dextrose    . feeding supplement (VITAL AF 1.2 CAL) Stopped (01/27/16 0845)  . fentaNYL infusion INTRAVENOUS Stopped (01/26/16 0730)  . norepinephrine (LEVOPHED) Adult infusion Stopped (01/27/16 0039)  . pantoprozole (PROTONIX) infusion 8 mg/hr (01/27/16 0700)  . propofol (DIPRIVAN) infusion 9.974 mcg/kg/min (01/27/16 0700)  . pureflow 2,500 mL/hr at 01/27/16 0445   . atorvastatin  80 mg Oral QPM  . B-complex with vitamin C  1 tablet Oral Daily  . cefTRIAXone  2 g Intravenous Q24H  . chlorhexidine gluconate (MEDLINE KIT)  15 mL Mouth Rinse BID  . cholecalciferol  1,000 Units Oral Daily  . feeding supplement  (PRO-STAT SUGAR FREE 64)  30 mL Per Tube BID  . insulin aspart  2-6 Units Subcutaneous Q4H  . insulin glargine  17 Units Subcutaneous Q24H  . ipratropium-albuterol  3 mL Nebulization Q6H  . mouth rinse  15 mL Mouth Rinse QID  . methylPREDNISolone (SOLU-MEDROL) injection  40 mg Intravenous Q12H  . sodium chloride flush  10-40 mL Intracatheter Q12H  . sodium phosphate  Dextrose 5% IVPB  15 mmol Intravenous Once   acetaminophen, bisacodyl, dextrose, heparin, ipratropium-albuterol, metoprolol, midazolam, ondansetron **OR** ondansetron (ZOFRAN) IV, sennosides, sodium chloride flush, traMADol, vecuronium  Assessment/ Plan:  Heidi Villegas is a 75 y.o. white female with diabetes mellitus type II, hypertension, atrial fibrillation, was admitted on 01/06/2016    1. Acute renal failure on chronic kidney disease stage III: with anuric urine output. Placed  on CRRT Acute renal failure from ATN, obstructive uropathy, hypotension, sepsis, and shock Status post left  ureteral stent placement 11/26 by Dr. Junious Silk Baseline creatinine of 1.1, GFR 49 from 12/04/15.  Chronic kidney disease secondary to diabetes, hypertension  History of microalbuminuria - discontinue CRRT - recommend exchanging dialysis catheter has been in since 11/26.  - Start IV albumin  2. Bilateral pneumonia with Acute respiratory failure: reintubated on 12/3.  - continue mechanical ventilation - solumedrol - appreciate pulmonary input.   3. Sepsis. Escherichia coli in blood and urine.  Off vasopressors - ceftriaxone  4. Anemia and thrombocytopenia: status post PRBC transfusion on 12/1    LOS: [place], [place] 12/5/201710:02 AM

## 2016-01-27 NOTE — Progress Notes (Signed)
Tube feeds paused for 4 hours for ABD [place], Dr Juanell Fairly aware.   Per Dr Juanell Fairly we will stop 81 mg ASA; pt has active GIB.  Pt has had two black/maroon liquid stools this AM.  SCDs are on, folded down to protect from stool. CRRT running without issues this AM.  Pt is awake for the most part, dozes at times but reawakens immediately with contact.  Responds with blinking eyes and tries to squeeze fingers.

## 2016-01-28 ENCOUNTER — Inpatient Hospital Stay: Payer: PPO

## 2016-01-28 LAB — RENAL FUNCTION PANEL
ALBUMIN: 1.9 g/dL — AB (ref 3.5–5.0)
ANION GAP: 6 (ref 5–15)
BUN: 59 mg/dL — ABNORMAL HIGH (ref 6–20)
CO2: 26 mmol/L (ref 22–32)
Calcium: 8.2 mg/dL — ABNORMAL LOW (ref 8.9–10.3)
Chloride: 106 mmol/L (ref 101–111)
Creatinine, Ser: 1.66 mg/dL — ABNORMAL HIGH (ref 0.44–1.00)
GFR, EST AFRICAN AMERICAN: 34 mL/min — AB (ref >60)
GFR, EST NON AFRICAN AMERICAN: 30 mL/min — AB (ref >60)
Glucose, Bld: 235 mg/dL — ABNORMAL HIGH (ref 65–99)
PHOSPHORUS: 5.1 mg/dL — AB (ref 2.5–4.6)
POTASSIUM: 5.4 mmol/L — AB (ref 3.5–5.1)
Sodium: 138 mmol/L (ref 135–145)

## 2016-01-28 LAB — BASIC METABOLIC PANEL
Anion gap: 6 (ref 5–15)
BUN: 58 mg/dL — AB (ref 6–20)
CHLORIDE: 106 mmol/L (ref 101–111)
CO2: 26 mmol/L (ref 22–32)
Calcium: 8.1 mg/dL — ABNORMAL LOW (ref 8.9–10.3)
Creatinine, Ser: 1.64 mg/dL — ABNORMAL HIGH (ref 0.44–1.00)
GFR calc Af Amer: 35 mL/min — ABNORMAL LOW (ref >60)
GFR calc non Af Amer: 30 mL/min — ABNORMAL LOW (ref >60)
GLUCOSE: 230 mg/dL — AB (ref 65–99)
POTASSIUM: 5.4 mmol/L — AB (ref 3.5–5.1)
Sodium: 138 mmol/L (ref 135–145)

## 2016-01-28 LAB — CBC
HEMATOCRIT: 24.3 % — AB (ref 35.0–47.0)
HEMOGLOBIN: 7.9 g/dL — AB (ref 12.0–16.0)
MCH: 28.2 pg (ref 26.0–34.0)
MCHC: 32.6 g/dL (ref 32.0–36.0)
MCV: 86.5 fL (ref 80.0–100.0)
Platelets: 68 10*3/uL — ABNORMAL LOW (ref 150–440)
RBC: 2.81 MIL/uL — ABNORMAL LOW (ref 3.80–5.20)
RDW: 16.9 % — ABNORMAL HIGH (ref 11.5–14.5)
WBC: 22.4 10*3/uL — AB (ref 3.6–11.0)

## 2016-01-28 LAB — GLUCOSE, CAPILLARY
Glucose-Capillary: 231 mg/dL — ABNORMAL HIGH (ref 65–99)
Glucose-Capillary: 168 mg/dL — ABNORMAL HIGH (ref 65–99)

## 2016-01-28 LAB — MAGNESIUM: Magnesium: 2 mg/dL (ref 1.7–2.4)

## 2016-01-28 LAB — PHOSPHORUS: Phosphorus: 5.1 mg/dL — ABNORMAL HIGH (ref 2.5–4.6)

## 2016-01-28 MED ORDER — GLYCOPYRROLATE 0.2 MG/ML IJ SOLN: 0.2000 mg | INTRAMUSCULAR | Status: DC | PRN

## 2016-01-28 MED ORDER — ONDANSETRON HCL 4 MG/2ML IJ SOLN: 4.0000 mg | Freq: Four times a day (QID) | INTRAMUSCULAR | Status: DC | PRN

## 2016-01-28 MED ORDER — ONDANSETRON 4 MG PO TBDP
4.0000 mg | ORAL_TABLET | Freq: Four times a day (QID) | ORAL | Status: DC | PRN
Start: 2016-01-28 — End: 2016-01-28

## 2016-01-28 MED ORDER — ACETAMINOPHEN 650 MG RE SUPP: 650.0000 mg | Freq: Four times a day (QID) | RECTAL | Status: DC | PRN

## 2016-01-28 MED ORDER — MORPHINE BOLUS VIA INFUSION
4.0000 mg | INTRAVENOUS | Status: DC | PRN
  Filled 2016-01-28: qty 4

## 2016-01-28 MED ORDER — ALBUMIN HUMAN 25 % IV SOLN
12.5000 g | Freq: Once | INTRAVENOUS | Status: AC
  Administered 2016-01-28: 12.5 g via INTRAVENOUS
  Filled 2016-01-28: qty 50

## 2016-01-28 MED ORDER — HALOPERIDOL LACTATE 5 MG/ML IJ SOLN: 0.5000 mg | INTRAMUSCULAR | Status: DC | PRN

## 2016-01-28 MED ORDER — HALOPERIDOL 0.5 MG PO TABS: 0.5000 mg | ORAL_TABLET | ORAL | Status: DC | PRN

## 2016-01-28 MED ORDER — HALOPERIDOL LACTATE 2 MG/ML PO CONC
0.5000 mg | ORAL | Status: DC | PRN
  Filled 2016-01-28: qty 0.3

## 2016-01-28 MED ORDER — POLYVINYL ALCOHOL 1.4 % OP SOLN
1.0000 [drp] | Freq: Four times a day (QID) | OPHTHALMIC | Status: DC | PRN
  Filled 2016-01-28: qty 15

## 2016-01-28 MED ORDER — GLYCOPYRROLATE 1 MG PO TABS: 1.0000 mg | ORAL_TABLET | ORAL | Status: DC | PRN

## 2016-01-28 MED ORDER — ACETAMINOPHEN 325 MG PO TABS: 650.0000 mg | ORAL_TABLET | Freq: Four times a day (QID) | ORAL | Status: DC | PRN

## 2016-01-28 MED ORDER — MORPHINE 100MG IN NS 100ML (1MG/ML) PREMIX INFUSION
4.0000 mg/h | INTRAVENOUS | Status: DC
  Administered 2016-01-28: 4 mg/h via INTRAVENOUS
  Filled 2016-01-28: qty 100

## 2016-01-28 MED ORDER — BIOTENE DRY MOUTH MT LIQD: 15.0000 mL | OROMUCOSAL | Status: DC | PRN

## 2016-01-31 LAB — BLOOD GAS, ARTERIAL
Acid-Base Excess: 5.9 mmol/L — ABNORMAL HIGH (ref 0.0–2.0)
Bicarbonate: 29.7 mmol/L — ABNORMAL HIGH (ref 20.0–28.0)
FIO2: 0.4
MECHVT: 500 mL
O2 SAT: 96.8 %
PCO2 ART: 39 mmHg (ref 32.0–48.0)
PEEP: 10 cmH2O
PH ART: 7.49 — AB (ref 7.350–7.450)
PO2 ART: 81 mmHg — AB (ref 83.0–108.0)
Patient temperature: 37
RATE: 18 resp/min

## 2016-02-23 NOTE — Progress Notes (Signed)
PULMONARY / CRITICAL CARE MEDICINE   Name: Heidi Villegas MRN: [id number] DOB: 1940-11-28    ADMISSION DATE:  01/08/2016 CONSULTATION DATE:  01/16/16  REFERRING MD: Dr. Estanislado Pandy  CHIEF COMPLAINT:  Septic shock and tachyarrhythmias  Discussion:76 y/o caucasian female presenting with urosepsis, septic shock and acute hypoxic respiratory failure necessitating intubation. CT abdomen 11/25 shows obstructing left renal calculi and hydronephrosis. Taken to the OR 11/26 for a cystoscopy with left retrogate pyelogram, ureteral stent and foley placement. Severe hypercarbia post op.  Extubated 12/4, then reintubated same day. Patient with multiorgan failure, critically ill with e coli bacteremia, course complicated by GI bleeding, acute renal failure   SUBJECTIVE:  Patient continue to be lightly sedated on vent, opens eyes to voic. Was afebrile events overnight.  No acute events overnight.  VITAL SIGNS: BP 115/66   Pulse 99   Temp 98.1 F (36.7 C) (Axillary)   Resp 18   Ht 5\' 1"  (1.549 m)   Wt 104.5 kg (230 lb 6.1 oz)   SpO2 93%   BMI 43.53 kg/m   HEMODYNAMICS:    VENTILATOR SETTINGS: Vent Mode: PRVC FiO2 (%):  [40 %-60 %] 60 % Set Rate:  [14 bmp] 14 bmp Vt Set:  [500 mL] 500 mL PEEP:  [5 cmH20] 5 cmH20  INTAKE / OUTPUT: I/O last 3 completed shifts: In: 3525.5 [I.V.:1862.5; Other:85; NG/GT:1228; IV Piggyback:350] Out: 2563 [Urine:25; Other:2538]  PHYSICAL EXAMINATION: General:  Sickly appearing, Caucasian female, intubated and on mechanical Ventillation Neuro:  Lightly sedated, follows simple commands HEENT:  Atraumatic, normocephalic, no JVD appreciated Cardiovascular: irregular, no MRG noted Lungs:  Diminished bibasilar, no wheezes, crackles or rhonchi noted Abdomen:  Soft, non tender with active bowel sounds Musculoskeletal:  No inflammation/deformity noted Skin:  Grossly intact  LABS:  BMET  Recent Labs Lab 01/26/16 0949 01/27/16 0445 25-Feb-2016 0459  NA 138  138  140 138  138  K 4.9 4.7  4.6 5.4*  5.4*  CL 107 106  109 106  106  CO2 27 28  28 26  26   BUN 27* 33*  32* 59*  58*  CREATININE 0.78 0.85  0.90 1.66*  1.64*  GLUCOSE 109* 190*  196* 235*  230*    Electrolytes  Recent Labs Lab 01/26/16 0427 01/26/16 0949 01/27/16 0445 02-25-2016 0459  CALCIUM  --  8.2* 8.2*  8.1* 8.2*  8.1*  MG 1.9  --  1.8 2.0  PHOS 2.5 2.4* 1.9* 5.1*  5.1*    CBC  Recent Labs Lab 01/26/16 0427 01/27/16 0445 02-25-2016 0459  WBC 31.0* 20.7* 22.4*  HGB 9.6* 8.9* 7.9*  HCT 28.3* 26.0* 24.3*  PLT 108* 87* 68*    Coag's  Recent Labs Lab 01/23/16 1740  INR 1.28    Sepsis Markers No results for input(s): LATICACIDVEN, PROCALCITON, O2SATVEN in the last 168 hours.  ABG  Recent Labs Lab 01/25/16 1230 01/27/16 0425  PHART 7.30* 7.49*  PCO2ART 60* 39  PO2ART 62* 81*    Liver Enzymes  Recent Labs Lab 01/26/16 0949 01/27/16 0445 2016-02-25 0459  AST  --  142*  --   ALT  --  92*  --   ALKPHOS  --  591*  --   BILITOT  --  1.6*  --   ALBUMIN 1.1* 1.2*  1.2* 1.9*    Cardiac Enzymes No results for input(s): TROPONINI, PROBNP in the last 168 hours.  Glucose  Recent Labs Lab 01/27/16 0721 01/27/16 1111 01/27/16 1554 01/27/16 1934 01/27/16  2343 01/29/2016 0333  GLUCAP 198* 170* 172* 204* 234* 231*    Imaging US Abdomen Limited Ruq  Result Date: 01/27/2016 CLINICAL DATA:  Sepsis, possible cholecystitis. The study was performed portably in the CCU. EXAM: US ABDOMEN LIMITED - RIGHT UPPER QUADRANT COMPARISON:  Abdominal and pelvic CT scan dated 17 January 2016 FINDINGS: Gallbladder: The gallbladder is adequately distended. There is a large stone measuring 2.7 cm in diameter. There is no gallbladder wall thickening or pericholecystic fluid. There is no positive sonographic Murphy's sign reported but the patient is intubated and may be receiving pain medication. The patient could not be turned on her side to assess whether  the stone is mobile. Common bile duct: Diameter: 3.1 mm Liver: The hepatic echotexture is normal. The organ is subjectively enlarged but appropriate for the patient's body habitus. There is no intrahepatic ductal dilation. There is a right pleural effusion. IMPRESSION: Large gallstone without objective evidence of acute cholecystitis. No acute abnormality of the liver or common bile duct. Right pleural effusion. Electronically Signed   By: David  Martinique M.D.   On: 01/27/2016 15:51     STUDIES:  11/23 Renal ultrasound>>Mild to moderate left hydronephrosis without obstructing cause identified, right renal mass.  11/25 2-D echo>>EF 40 to 45% 11/25 CT Abd Pelvis>>Bilateral mild perinephric stranding. There is mild to moderate left hydronephrosis. Axial image 43 there is 6 mm calcified obstructive calculus in proximal left ureter at the level of upper endplate of L4 vertebral body. There is a Foley catheter within decompressed urinary bladder. No small bowel obstruction.  Few diverticula are noted descending colon. No evidence of acute colitis or diverticulitis. Status post hysterectomy. Degenerative changes thoracolumbar spine. There is about 4 mm anterolisthesis L5 on S1 vertebral body. Bilateral lower lobe posterior consolidation with air bronchogram highly suspicious for pneumonia.  CULTURES: 11/23 BC; GNR; PCR- enterobacter, Ecoli in both aerobic and anaerobic bottles  11/23 UC; Escherichia coli 11/24 Flu negative 11/23 Cdiff negative    ANTIBIOTICS: 11/23 Vancomycin>>11/24 11/23 Zosyn>>11/23 11/25 Unasyn x 1dose 11/24 Meropenem>>11/27 11/27 Ceftriaxone>> day 9 on 12/5   SIGNIFICANT EVENTS: 11/23-Pt admitted with severe sepsis secondary to pylonephritis 11/24-Pt iin severe respiratory distress , requiring BiPAP 11/25-Pt failed Bipap requiring mechanical intubation 11/26- Dialysis per nephro 11/30-remains on CRRT 12/1 on CRRT fio2 50%, PEEP 15; Gi bleed.  12/2 40% PEEP 5, start  SAT/SBT 12/3 extubated, then reintubated.    LINES/TUBES: 01/16/16 Right IJ>> 11/25 ETT>>   11/26>>Right femoral dual lumen HD catheter    ASSESSMENT / PLAN: PULMONARY A: Acute hypoxic respiratory failure possibly related to  septic shock-status-severe hypercarbia post op -Failed extubation on 12/4 and reintubated.  -CXR 12/6 images reviewed; showed worsening pulmonary edema.  -Intubated 11/25; Vent Day 12;  PRVC/14500/5/60% P:   Full vent support wean as tolerated; Prn CXR and ABG Continue Nebulized bronchodilators Continue IV steroids wean as tolerated VAP Bundle  CARDIOVASCULAR A:  --Septic shock secondary to urosepsis-now weaned off of pressors. --Afib with RVR, controlled on amiodarone infusion Elevated troponin likely demand ischemia secondary to acute respiratory failure and septic shock  P:  Continuous telemetry Continue Amiodarone gtt Cardiology consulted appreciate input  RENAL A:   AKI likely secondary to sepsis-improving History of Microalbuminuria Left hydronephrosis with obstructing renal calculi on CT S/P STAT cystoscopy with stent placement 01/03/2016  Right renal mass-not apparent on CT abdomen P:   Follow BMET Avoid nephrotoxic drugs Replace electrolytes per ICU protocol Urology consulted appreciate input Nephrology consulted appreciate input GASTROINTESTINAL A:  Abdominal distension concerning for?illeus/bowel ischemia -Acute GI bleed-GI consulted, patient is now on Protonix drip. P:   Continue Protonix gtt GI consulted appreciate input Monitor for s/sx of bleeding  HEMATOLOGIC A:   Anemia-acute blood loss superimposed on anemia of chronic disease  P:  No chemical VTE prophylaxis  SCDs for VTE prophylaxis Trend CBC's prbc's transfusedonn 12/1 Transfuse for hgb <7 Monitor for further rectal bleeding  INFECTIOUS A:   Urosepsis with septic shock. P:   Trend WBC's and monitor fever curve Continue Ceftriaxone  Follow  cultures  ENDOCRINE A:   Diabetes Melitus P:   SSI Continue lantus CBG's q4hrs  NEUROLOGIC A:   Acute metabolic encephalopathy likely secondary to sepsis P:   RASS score 0 to -1 Fentanyl and  Versed to maintain RASS goal WUA daily  -Marda Stalker, M.D.    2016/02/01, 6:43 AM  Critical Care Attestation.  I have personally obtained a history, examined the patient, evaluated laboratory and imaging results, formulated the assessment and plan and placed orders. The Patient requires high complexity decision making for assessment and support, frequent evaluation and titration of therapies, application of advanced monitoring technologies and extensive interpretation of multiple databases. The patient has critical illness that could lead imminently to failure of 1 or more organ systems and requires the highest level of physician preparedness to intervene.  Critical Care Time devoted to patient care services described in this note is 45 minutes and is exclusive of time spent in procedures.

## 2016-02-23 NOTE — Progress Notes (Addendum)
Pharmacy Antibiotic Note/ Electrolyte Monitoring/  Heidi Villegas is a 76 y.o. female admitted on 01/12/2016 with sepsis.  Pharmacy has been consulted for ceftriaxone dosing.  Pharmacy also consulted to assist in electrolyte monitoring and to adjust medications based on CRRT. Pt started CRRT 11/28 and appears to be doing better 11/29  Plan: 1. Antibiotics Continue ceftriaxone 2 g IV q 24 hours for E.coli bacteremia. Stop date to total 14 days of abx therapy.  2. Electrolytes All electrolytes wnl. Will give NaPhos 15 mmol x 1. Will recheck electrolytes with am labs.  12/6 0459 K 5.4, Phos 5.1, Mg 2. No indication for repletion, patient now off CRRT. Short HD planned for today. Pharmacy will continue to follow.   3. CRRT Patient now off CRRT    Height: 5\' 1"  (154.9 cm) Weight: 230 lb 6.1 oz (104.5 kg) IBW/kg (Calculated) : 47.8  Temp (24hrs), Avg:98.5 F (36.9 C), Min:98.1 F (36.7 C), Max:98.9 F (37.2 C)   Recent Labs Lab 01/24/16 0902  01/25/16 0423  01/25/16 2235 01/26/16 0300 01/26/16 0427 01/26/16 0949 01/27/16 0445 February 06, 2016 0459  WBC 28.7*  --  32.9*  --   --   --  31.0*  --  20.7* 22.4*  CREATININE  --   < > 0.70  < > 0.70 0.73  --  0.78 0.85  0.90 1.66*  1.64*  < > = values in this interval not displayed.  Estimated Creatinine Clearance: 34 mL/min (by C-G formula based on SCr of 1.64 mg/dL (H)).    Allergies  Allergen Reactions  . Codeine Nausea And Vomiting  . Sulfa Antibiotics     Antimicrobials this admission: Piperacillin/tazobactam 11/23 >> 11/24 Vancomycin 11/23 >> 11/24, 11/25 >> 11/26 Meropenem 11/24 >> 11/27 Ceftriaxone 11/27 >>  Microbiology results: 11/23 BCx: E coli 2/2, staph 1/2, S ceftriaxone 11/23 UCx: E coli resistant to ampicillin and amp/sulbactam 11/23 Sputum: pending  11/23 MRSA PCR: negative  Thank you for allowing pharmacy to be a part of this patient's care.  Larene Beach, PharmD Clinical  Pharmacist 02/06/16 6:44 AM

## 2016-02-23 NOTE — Progress Notes (Addendum)
Writer called patient's husband to find out when he might be coming to the hospital.  He stated his daughter was coming but would probably not be able to stay while patient was extubated.  He said he cannot come.  His pastor and pastor's wife will also come.  He stated he wants his wife to be "kept comfortable" and the funeral home will be [employer] in [place].

## 2016-02-23 NOTE — Progress Notes (Signed)
MD considered restarting CRRT, but has decided on HD because pt is edema has worsened overnite & K is 5.4.  He will call pt's husband. Fentanyl decreased to 125 mcg/hr; pt is more alert.  O2 sats 92% on 60% FIO2.  Afib controlled in 90s.  Rhonchi on right side post suctioning. Overall appearance is  worse since Monday, start of writer's care of this patient.

## 2016-02-23 NOTE — Progress Notes (Signed)
Nutrition Follow-up  DOCUMENTATION CODES:   Obesity unspecified  INTERVENTION:  1. Vital 1.2 held, if pt is not extubated - Continue Vital 1.2 @ 94mL/hr, Pro-Stat 70mL BID - providing 1496 calories, 111gm protein, and 875cc free water   2. Per Dr. Juanell Fairly note, pt will undergo one-way extubation today NUTRITION DIAGNOSIS:   Inadequate oral intake related to inability to eat as evidenced by NPO status. -ongoing  GOAL:   Provide needs based on ASPEN/SCCM guidelines -meeting  MONITOR:   Vent status, Labs, Weight trends, TF tolerance, I & O's  REASON FOR ASSESSMENT:   Consult, Ventilator Enteral/tube feeding initiation and management  ASSESSMENT:   76 y/o caucasian female presenting with urosepsis, septic shock and acute hypoxic respiratory failure necessitating intubation. CT abdomen 11/25 shows obstructing left renal calculi and hydronephrosis  Patient is currently intubated on ventilator support MV: 7 L/min Temp (24hrs), Avg:98.3 F (36.8 C), Min:97.9 F (36.6 C), Max:98.9 F (37.2 C) Propofol: none Labs and medications reviewed: K 5.4, Phos 5.1 B&C Complex vitamins, Vitamin D, Solumedrol Amiodarone gtt, Fentanyl gtt, Protonix gtt  Diet Order:  Diet NPO time specified  Skin:  Wound (see comment) (deep tissue injury to L. heel)  Last BM:  12/3  Height:   Ht Readings from Last 1 Encounters:  01/23/16 5\' 1"  (1.549 m)    Weight:   Wt Readings from Last 1 Encounters:  09-Feb-2016 230 lb 6.1 oz (104.5 kg)    Ideal Body Weight:  47.72 kg  BMI:  Body mass index is 43.53 kg/m.  Estimated Nutritional Needs:   Kcal:  IV:[id number] calories  Protein:  96-120 g  Fluid:  Per MD/NP/PA  EDUCATION NEEDS:   No education needs identified at this time  Satira Anis. Jovanny Stephanie, MS, RD LDN Inpatient Clinical Dietitian Pager [phone]

## 2016-02-23 NOTE — Progress Notes (Signed)
Discussed case with pt's husband.  I explained the patient has continued to do poorly despite being on the ventilator for 12 days with aggressive care of her underlying issues.  She continues to require dialysis, and has not been able to wean off the ventilator. Discussed that the patient will likely need a tracheostomy.  He notes that he and his wife have discussed this, she would not want this done, they have a relative who has been in the hospital for a long period of time, and feel that she would not want to do the same. She would prefer to be made comfortable and allowed to pass away.    A further downward course is predictable and likely, will likely involve further hospital admissions, intubation, with further decline in functional status.  Pt's caregiver discussed the situation with her family and with the patient.    We decided that patient's code status would be DNR. Dialysis will be stopped, the patient will be undergo one-way extubation.     -Marda Stalker, M.D.  Time spent in discussion 30 min.  February 16, 2016

## 2016-02-23 NOTE — Progress Notes (Signed)
Pt was bathed and all lines removed.  Placed in body bag with tag / face label affixed to her toe.  Face label adhered to body bag.  Transferred to the morgue by transporter.

## 2016-02-23 NOTE — Progress Notes (Signed)
Daughter and pastor arrived to unit, spent time with patient.  Came to find Probation officer and stated they were prepared for extubation.  RT extubated patient and she immediately declined and died within 5 minutes.  Time of death 1655 pronounced by Probation officer and Gabriel Cirri, RN on ICU.  Family declined bereavement cart and opted to stay with patient for a little while after death and allow staff to clean her up later after they left.  CN notified of death.

## 2016-02-23 NOTE — Progress Notes (Signed)
[facility]  ROUNDING NOTE   Subjective:   Taken off CRRT. UOP 20 K 5.4 Off norepinephrine.   Objective:  Vital signs in last 24 hours:  Temp:  [97.9 F (36.6 C)-98.9 F (37.2 C)] 97.9 F (36.6 C) (12/06 0800) Pulse Rate:  [97-111] 98 (12/06 1000) Resp:  [16-25] 18 (12/06 1000) BP: (101-144)/(57-107) 116/58 (12/06 1000) SpO2:  [89 %-98 %] 93 % (12/06 1000) FiO2 (%):  [40 %-60 %] 40 % (12/06 1000) Weight:  [104.5 kg (230 lb 6.1 oz)] 104.5 kg (230 lb 6.1 oz) (12/06 0445)  Weight change: -1.5 kg (-3 lb 4.9 oz) Filed Weights   01/26/16 0400 01/27/16 0300 Feb 01, 2016 0445  Weight: 103.6 kg (228 lb 6.3 oz) 106 kg (233 lb 11 oz) 104.5 kg (230 lb 6.1 oz)    Intake/Output: I/O last 3 completed shifts: In: 3950.8 [I.V.:2053.8; NG/GT:1497; IV RCVELFYBO:175] Out: 1875 [Urine:45; Other:1830]   Intake/Output this shift:  Total I/O In: 530.6 [I.V.:245.6; NG/GT:285] Out: 10 [Urine:10]  Physical Exam: General: Critically ill   Head: ETT   Eyes: Eyes open, pupils equal and reactive to light  Neck: TLC RIJ  Lungs:  Bilateral rhonchi, vent assisted PRVC FIO2 40%  Heart: Tachycardia, irregular  Abdomen:  obese, +distended  Extremities: 2+ peripheral edema.  Neurologic: Intubated, sedated  Skin: No lesions  Access: Right femoral temporary dialysis catheter 11/26 Dr. Isidore Moos    Basic Metabolic Panel:  Recent Labs Lab 01/24/16 0540  01/25/16 0423  01/25/16 2235 01/26/16 0300 01/26/16 0427 01/26/16 0949 01/27/16 0445 01-Feb-2016 0459  NA  --   < > 140  < > 139 139  --  138 138  140 138  138  K  --   < > 4.7  < > 4.5 4.4  --  4.9 4.7  4.6 5.4*  5.4*  CL  --   < > 108  < > 108 109  --  107 106  109 106  106  CO2  --   < > 26  < > 26 27  --  _0 GLUCOSE  --   < > 127*  < > 131* 118*  --  109* 190*  196* 235*  230*  BUN  --   < > 26*  < > 26* 26*  --  27* 33*  32* 59*  58*  CREATININE  --   < > 0.70  < > 0.70 0.73  --  0.78 0.85  0.90  1.66*  1.64*  CALCIUM  --   < > 7.9*  < > 8.1* 7.9*  --  8.2* 8.2*  8.1* 8.2*  8.1*  MG 1.7  --  1.6*  --   --   --  1.9  --  1.8 2.0  PHOS 2.4*  < > 2.7  < > 2.6 2.5 2.5 2.4* 1.9* 5.1*  5.1*  < > = values in this interval not displayed.  Liver Function Tests:  Recent Labs Lab 01/25/16 2235 01/26/16 0300 01/26/16 0949 01/27/16 0445 01-Feb-2016 0459  AST  --   --   --  142*  --   ALT  --   --   --  92*  --   ALKPHOS  --   --   --  591*  --   BILITOT  --   --   --  1.6*  --   PROT  --   --   --  5.2*  --  ALBUMIN 1.2* 1.1* 1.1* 1.2*  1.2* 1.9*   No results for input(s): LIPASE, AMYLASE in the last 168 hours. No results for input(s): AMMONIA in the last 168 hours.  CBC:  Recent Labs Lab 01/22/16 0630 01/23/16 0405 01/23/16 1740  01/24/16 0902 01/25/16 0423 01/26/16 0427 01/27/16 0445 02/18/16 0459  WBC 25.3* 22.4* 23.8*  < > 28.7* 32.9* 31.0* 20.7* 22.4*  NEUTROABS 24.1* 21.0* 22.1*  --  27.1*  --   --   --   --   HGB 9.0* 8.7* 8.8*  < > 10.4* 10.4* 9.6* 8.9* 7.9*  HCT 26.8* 25.5* 26.1*  < > 30.9* 30.4* 28.3* 26.0* 24.3*  MCV 82.0 82.4 83.9  < > 83.0 83.1 83.6 85.5 86.5  PLT 105* 95* 109*  < > 105* 115* 108* 87* 68*  < > = values in this interval not displayed.  Cardiac Enzymes: No results for input(s): CKTOTAL, CKMB, CKMBINDEX, TROPONINI in the last 168 hours.  BNP: Invalid input(s): POCBNP  CBG:  Recent Labs Lab 01/27/16 1554 01/27/16 1934 01/27/16 2343 02-18-2016 0333 February 18, 2016 0723  GLUCAP 172* 204* 234* 231* 168*    Microbiology: Results for orders placed or performed during the hospital encounter of 01/08/2016  Urine culture     Status: Abnormal   Collection Time: 01/14/2016  1:29 PM  Result Value Ref Range Status   Specimen Description URINE, RANDOM  Final   Special Requests NONE  Final   Culture >=100,000 COLONIES/mL ESCHERICHIA COLI (A)  Final   Report Status 01/17/2016 FINAL  Final   Organism ID, Bacteria ESCHERICHIA COLI (A)  Final       Susceptibility   Escherichia coli - MIC*    AMPICILLIN >=32 RESISTANT Resistant     CEFAZOLIN <=4 SENSITIVE Sensitive     CEFTRIAXONE <=1 SENSITIVE Sensitive     CIPROFLOXACIN <=0.25 SENSITIVE Sensitive     GENTAMICIN <=1 SENSITIVE Sensitive     IMIPENEM <=0.25 SENSITIVE Sensitive     NITROFURANTOIN <=16 SENSITIVE Sensitive     TRIMETH/SULFA <=20 SENSITIVE Sensitive     AMPICILLIN/SULBACTAM >=32 RESISTANT Resistant     PIP/TAZO <=4 SENSITIVE Sensitive     Extended ESBL NEGATIVE Sensitive     * >=100,000 COLONIES/mL ESCHERICHIA COLI  Culture, blood (routine x 2)     Status: Abnormal   Collection Time: 12/24/2015  1:29 PM  Result Value Ref Range Status   Specimen Description BLOOD RIGHT HAND  Final   Special Requests   Final    BOTTLES DRAWN AEROBIC AND ANAEROBIC 11MLAERO,8MLANA   Culture  Setup Time   Final    GRAM NEGATIVE RODS IN BOTH AEROBIC AND ANAEROBIC BOTTLES CRITICAL RESULT CALLED TO, READ BACK BY AND VERIFIED WITH: NATE COOKSON AT [place] ON 01/16/16 [place]. Performed at [place] [facility] (A)  Final   Report Status 01/19/2016 FINAL  Final   Organism ID, Bacteria ESCHERICHIA COLI  Final      Susceptibility   Escherichia coli - MIC*    AMPICILLIN >=32 RESISTANT Resistant     CEFAZOLIN <=4 SENSITIVE Sensitive     CEFEPIME <=1 SENSITIVE Sensitive     CEFTAZIDIME <=1 SENSITIVE Sensitive     CEFTRIAXONE <=1 SENSITIVE Sensitive     CIPROFLOXACIN <=0.25 SENSITIVE Sensitive     GENTAMICIN <=1 SENSITIVE Sensitive     IMIPENEM <=0.25 SENSITIVE Sensitive     TRIMETH/SULFA <=20 SENSITIVE Sensitive     AMPICILLIN/SULBACTAM >=32 RESISTANT Resistant  PIP/TAZO <=4 SENSITIVE Sensitive     Extended ESBL NEGATIVE Sensitive     * ESCHERICHIA COLI  Culture, blood (routine x 2)     Status: Abnormal   Collection Time: 01/12/2016  1:29 PM  Result Value Ref Range Status   Specimen Description BLOOD LEFT ANTECUBITAL  Final   Special Requests   Final    BOTTLES  DRAWN AEROBIC AND ANAEROBIC AER 12ML ANA 11ML   Culture  Setup Time   Final    GRAM NEGATIVE RODS IN BOTH AEROBIC AND ANAEROBIC BOTTLES CRITICAL VALUE NOTED.  VALUE IS CONSISTENT WITH PREVIOUSLY REPORTED AND CALLED VALUE. GRAM POSITIVE COCCI AEROBIC BOTTLE ONLY CRITICAL RESULT CALLED TO, READ BACK BY AND VERIFIED WITH: Charlane Ferretti @ [phone] 01/16/16 by [facility].    Culture (A)  Final    ESCHERICHIA COLI SUSCEPTIBILITIES PERFORMED ON PREVIOUS CULTURE WITHIN THE LAST 5 DAYS. STAPHYLOCOCCUS SPECIES (COAGULASE NEGATIVE) THE SIGNIFICANCE OF ISOLATING THIS ORGANISM FROM A SINGLE SET OF BLOOD CULTURES WHEN MULTIPLE SETS ARE DRAWN IS UNCERTAIN. PLEASE NOTIFY THE MICROBIOLOGY DEPARTMENT WITHIN ONE WEEK IF SPECIATION AND SENSITIVITIES ARE REQUIRED. Performed at [facility]    Report Status 12/27/2015 FINAL  Final  Blood Culture ID Panel (Reflexed)     Status: Abnormal   Collection Time: 01/21/2016  1:29 PM  Result Value Ref Range Status   Enterococcus species NOT DETECTED NOT DETECTED Final   Listeria monocytogenes NOT DETECTED NOT DETECTED Final   Staphylococcus species NOT DETECTED NOT DETECTED Final   Staphylococcus aureus NOT DETECTED NOT DETECTED Final   Streptococcus species NOT DETECTED NOT DETECTED Final   Streptococcus agalactiae NOT DETECTED NOT DETECTED Final   Streptococcus pneumoniae NOT DETECTED NOT DETECTED Final   Streptococcus pyogenes NOT DETECTED NOT DETECTED Final   Acinetobacter baumannii NOT DETECTED NOT DETECTED Final   Enterobacteriaceae species DETECTED (A) NOT DETECTED Final    Comment: CRITICAL RESULT CALLED TO, READ BACK BY AND VERIFIED WITH: NATE COOKSON AT 0556 ON 01/16/16 [place].    Enterobacter cloacae complex NOT DETECTED NOT DETECTED Final   Escherichia coli DETECTED (A) NOT DETECTED Final    Comment: CRITICAL RESULT CALLED TO, READ BACK BY AND VERIFIED WITH: NATE COOKSON AT 1478 ON 01/16/16 [place].    Klebsiella oxytoca NOT DETECTED NOT DETECTED Final    Klebsiella pneumoniae NOT DETECTED NOT DETECTED Final   Proteus species NOT DETECTED NOT DETECTED Final   Serratia marcescens NOT DETECTED NOT DETECTED Final   Carbapenem resistance NOT DETECTED NOT DETECTED Final   Haemophilus influenzae NOT DETECTED NOT DETECTED Final   Neisseria meningitidis NOT DETECTED NOT DETECTED Final   Pseudomonas aeruginosa NOT DETECTED NOT DETECTED Final   Candida albicans NOT DETECTED NOT DETECTED Final   Candida glabrata NOT DETECTED NOT DETECTED Final   Candida krusei NOT DETECTED NOT DETECTED Final   Candida parapsilosis NOT DETECTED NOT DETECTED Final   Candida tropicalis NOT DETECTED NOT DETECTED Final  Blood Culture ID Panel (Reflexed)     Status: Abnormal   Collection Time: 01/07/2016  1:40 PM  Result Value Ref Range Status   Enterococcus species NOT DETECTED NOT DETECTED Final   Vancomycin resistance NOT DETECTED NOT DETECTED Final   Listeria monocytogenes NOT DETECTED NOT DETECTED Final   Staphylococcus species DETECTED (A) NOT DETECTED Final    Comment: CRITICAL RESULT CALLED TO, READ BACK BY AND VERIFIED WITH: Charlane Ferretti @ 2956 01/16/16 by [facility].    Staphylococcus aureus NOT DETECTED NOT DETECTED Final   Methicillin resistance NOT  DETECTED NOT DETECTED Final   Streptococcus species NOT DETECTED NOT DETECTED Final   Streptococcus agalactiae NOT DETECTED NOT DETECTED Final   Streptococcus pneumoniae NOT DETECTED NOT DETECTED Final   Streptococcus pyogenes NOT DETECTED NOT DETECTED Final   Acinetobacter baumannii NOT DETECTED NOT DETECTED Final   Enterobacteriaceae species DETECTED (A) NOT DETECTED Final    Comment: CRITICAL RESULT CALLED TO, READ BACK BY AND VERIFIED WITH: Charlane Ferretti @ [phone] 01/16/16 by [facility].    Enterobacter cloacae complex NOT DETECTED NOT DETECTED Final   Escherichia coli DETECTED (A) NOT DETECTED Final    Comment: CRITICAL RESULT CALLED TO, READ BACK BY AND VERIFIED WITH: Charlane Ferretti @ 7416 01/16/16 by [facility].    Klebsiella  oxytoca NOT DETECTED NOT DETECTED Final   Klebsiella pneumoniae NOT DETECTED NOT DETECTED Final   Proteus species NOT DETECTED NOT DETECTED Final   Serratia marcescens NOT DETECTED NOT DETECTED Final   Carbapenem resistance NOT DETECTED NOT DETECTED Final   Haemophilus influenzae NOT DETECTED NOT DETECTED Final   Neisseria meningitidis NOT DETECTED NOT DETECTED Final   Pseudomonas aeruginosa NOT DETECTED NOT DETECTED Final   Candida albicans NOT DETECTED NOT DETECTED Final   Candida glabrata NOT DETECTED NOT DETECTED Final   Candida krusei NOT DETECTED NOT DETECTED Final   Candida parapsilosis NOT DETECTED NOT DETECTED Final   Candida tropicalis NOT DETECTED NOT DETECTED Final  MRSA PCR Screening     Status: None   Collection Time: 12/27/2015  7:00 PM  Result Value Ref Range Status   MRSA by PCR NEGATIVE NEGATIVE Final    Comment:        The GeneXpert MRSA Assay (FDA approved for NASAL specimens only), is one component of a comprehensive MRSA colonization surveillance program. It is not intended to diagnose MRSA infection nor to guide or monitor treatment for MRSA infections.   C difficile quick scan w PCR reflex     Status: None   Collection Time: 01/23/16  5:18 PM  Result Value Ref Range Status   C Diff antigen NEGATIVE NEGATIVE Final   C Diff toxin NEGATIVE NEGATIVE Final   C Diff interpretation No C. difficile detected.  Final    Coagulation Studies: No results for input(s): LABPROT, INR in the last 72 hours.  Urinalysis: No results for input(s): COLORURINE, LABSPEC, PHURINE, GLUCOSEU, HGBUR, BILIRUBINUR, KETONESUR, PROTEINUR, UROBILINOGEN, NITRITE, LEUKOCYTESUR in the last 72 hours.  Invalid input(s): APPERANCEUR    Imaging: Dg Chest 1 View  Result Date: 02/25/16 CLINICAL DATA:  Sepsis, difficulty breathing EXAM: CHEST 1 VIEW COMPARISON:  01/27/2016 FINDINGS: Cardiac shadow is stable. An endotracheal tube, nasogastric catheter and right jugular central line are  again seen and stable. Increasing bilateral infiltrates are noted when compare with the prior exam. This is most marked in the right upper lobe. There may be some superimposed congestive failure present as well. IMPRESSION: Significant increase in bilateral infiltrative changes. A portion of this may be related to vascular congestion. Electronically Signed   By: Inez Catalina M.D.   On: February 25, 2016 07:44   Dg Chest 1 View  Result Date: 01/27/2016 CLINICAL DATA:  Septic shock. Community acquired pneumonia. Acute respiratory failure. On ventilator. EXAM: CHEST 1 VIEW COMPARISON:  01/26/2016 FINDINGS: Support apparatus remains in stable position. No evidence of pneumothorax. Bilateral heterogeneous airspace disease with greatest involvement of the right upper and lower lobes shows no significant change allowing for differences in radiographic technique. Heart size remains stable . IMPRESSION: No significant interval change in  bilateral airspace disease. Electronically Signed   By: Earle Gell M.D.   On: 01/27/2016 07:06   US Abdomen Limited Ruq  Result Date: 01/27/2016 CLINICAL DATA:  Sepsis, possible cholecystitis. The study was performed portably in the CCU. EXAM: US ABDOMEN LIMITED - RIGHT UPPER QUADRANT COMPARISON:  Abdominal and pelvic CT scan dated 17 January 2016 FINDINGS: Gallbladder: The gallbladder is adequately distended. There is a large stone measuring 2.7 cm in diameter. There is no gallbladder wall thickening or pericholecystic fluid. There is no positive sonographic Murphy's sign reported but the patient is intubated and may be receiving pain medication. The patient could not be turned on her side to assess whether the stone is mobile. Common bile duct: Diameter: 3.1 mm Liver: The hepatic echotexture is normal. The organ is subjectively enlarged but appropriate for the patient's body habitus. There is no intrahepatic ductal dilation. There is a right pleural effusion. IMPRESSION: Large gallstone  without objective evidence of acute cholecystitis. No acute abnormality of the liver or common bile duct. Right pleural effusion. Electronically Signed   By: David  Martinique M.D.   On: 01/27/2016 15:51     Medications:   . amiodarone 30 mg/hr (2016-02-13 1000)  . feeding supplement (VITAL AF 1.2 CAL) 1,000 mL (02/13/16 1000)  . fentaNYL 10 mcg/ml infusion 125 mcg/hr (02/13/2016 1000)  . norepinephrine (LEVOPHED) Adult infusion Stopped (01/27/16 0039)  . pantoprozole (PROTONIX) infusion 8 mg/hr (13-Feb-2016 1000)  . propofol (DIPRIVAN) infusion Stopped (02/13/2016 0600)   . albumin human  12.5 g Intravenous Q6H  . atorvastatin  80 mg Oral QPM  . B-complex with vitamin C  1 tablet Oral Daily  . cefTRIAXone  2 g Intravenous Q24H  . chlorhexidine gluconate (MEDLINE KIT)  15 mL Mouth Rinse BID  . cholecalciferol  1,000 Units Oral Daily  . feeding supplement (PRO-STAT SUGAR FREE 64)  30 mL Per Tube BID  . insulin aspart  0-15 Units Subcutaneous Q4H  . ipratropium-albuterol  3 mL Nebulization Q6H  . mouth rinse  15 mL Mouth Rinse QID  . methylPREDNISolone (SOLU-MEDROL) injection  40 mg Intravenous Q12H  . sodium chloride flush  10-40 mL Intracatheter Q12H   acetaminophen, bisacodyl, heparin, ipratropium-albuterol, metoprolol, midazolam, ondansetron **OR** ondansetron (ZOFRAN) IV, sennosides, sodium chloride flush, traMADol, vecuronium  Assessment/ Plan:  Heidi Villegas is a 76 y.o. white female with diabetes mellitus type II, hypertension, atrial fibrillation, was admitted on 01/01/2016    1. Acute renal failure on chronic kidney disease stage III: with anuric urine output. Placed on CRRT Acute renal failure from ATN, obstructive uropathy, hypotension, sepsis, and shock Status post left  ureteral stent placement 11/26 by Dr. Junious Silk Baseline creatinine of 1.1, GFR 49 from 12/04/15.  Chronic kidney disease secondary to diabetes, hypertension  History of microalbuminuria - discontinued CRRT -  Will attempted intermittent hemodialysis today with 2 K bath - recommend exchanging dialysis catheter has been in since 11/26.  - IV albumin  2. Bilateral pneumonia with Acute respiratory failure: reintubated on 12/3.  - continue mechanical ventilation - solumedrol - appreciate pulmonary input.   3. Sepsis. Escherichia coli in blood and urine.  Off vasopressors - completed ceftriaxone course  4. Anemia and thrombocytopenia: status post PRBC transfusion on 12/1  Consult palliative care. Overall prognosis is poor.     LOS: [place] 02-12-1709:42 AM

## 2016-02-23 NOTE — Progress Notes (Signed)
Propofol has been stopped. Patient seems to be doing well just on fentanyl drip. VS stable. No increased work of breathing noted. Will continue to monitor patient.

## 2016-02-23 NOTE — Discharge Summary (Signed)
Admission date: 12/30/2015 Discharge date: February 27, 2016  Condition: Deceased.   Final discharge diagnosis: -Septic shock.  --Acute hypoxic respiratory failure.  --Hypotension.  --Acute renal failure require dialysis.  --Renal stones.   Hospital Course:  The patient's course in the hospital declined, she was put on the ventilator, and on CRRT. Her course did not improve, she failed weaning trial and could not be extubated.  On ventilar day 12, discussed with family regarding possible tracheostomy vs. Comfort measures. They chose the latter, the patient then underwent terminal extubation with sedation and she passed away soon after.   Ashby Dawes, M.D.  02/03/2016

## 2016-02-23 DEATH — deceased

## 2017-05-17 IMAGING — DX DG CHEST 1V PORT
1 series · 1 of 1 positions shown · non-contrast
Comparison: Portable chest x-ray January 25, 2016

CLINICAL DATA: Acute respiratory failure, sepsis,
community-acquired pneumonia, acute renal injury.

EXAM:
PORTABLE CHEST 1 VIEW

[chest ap]
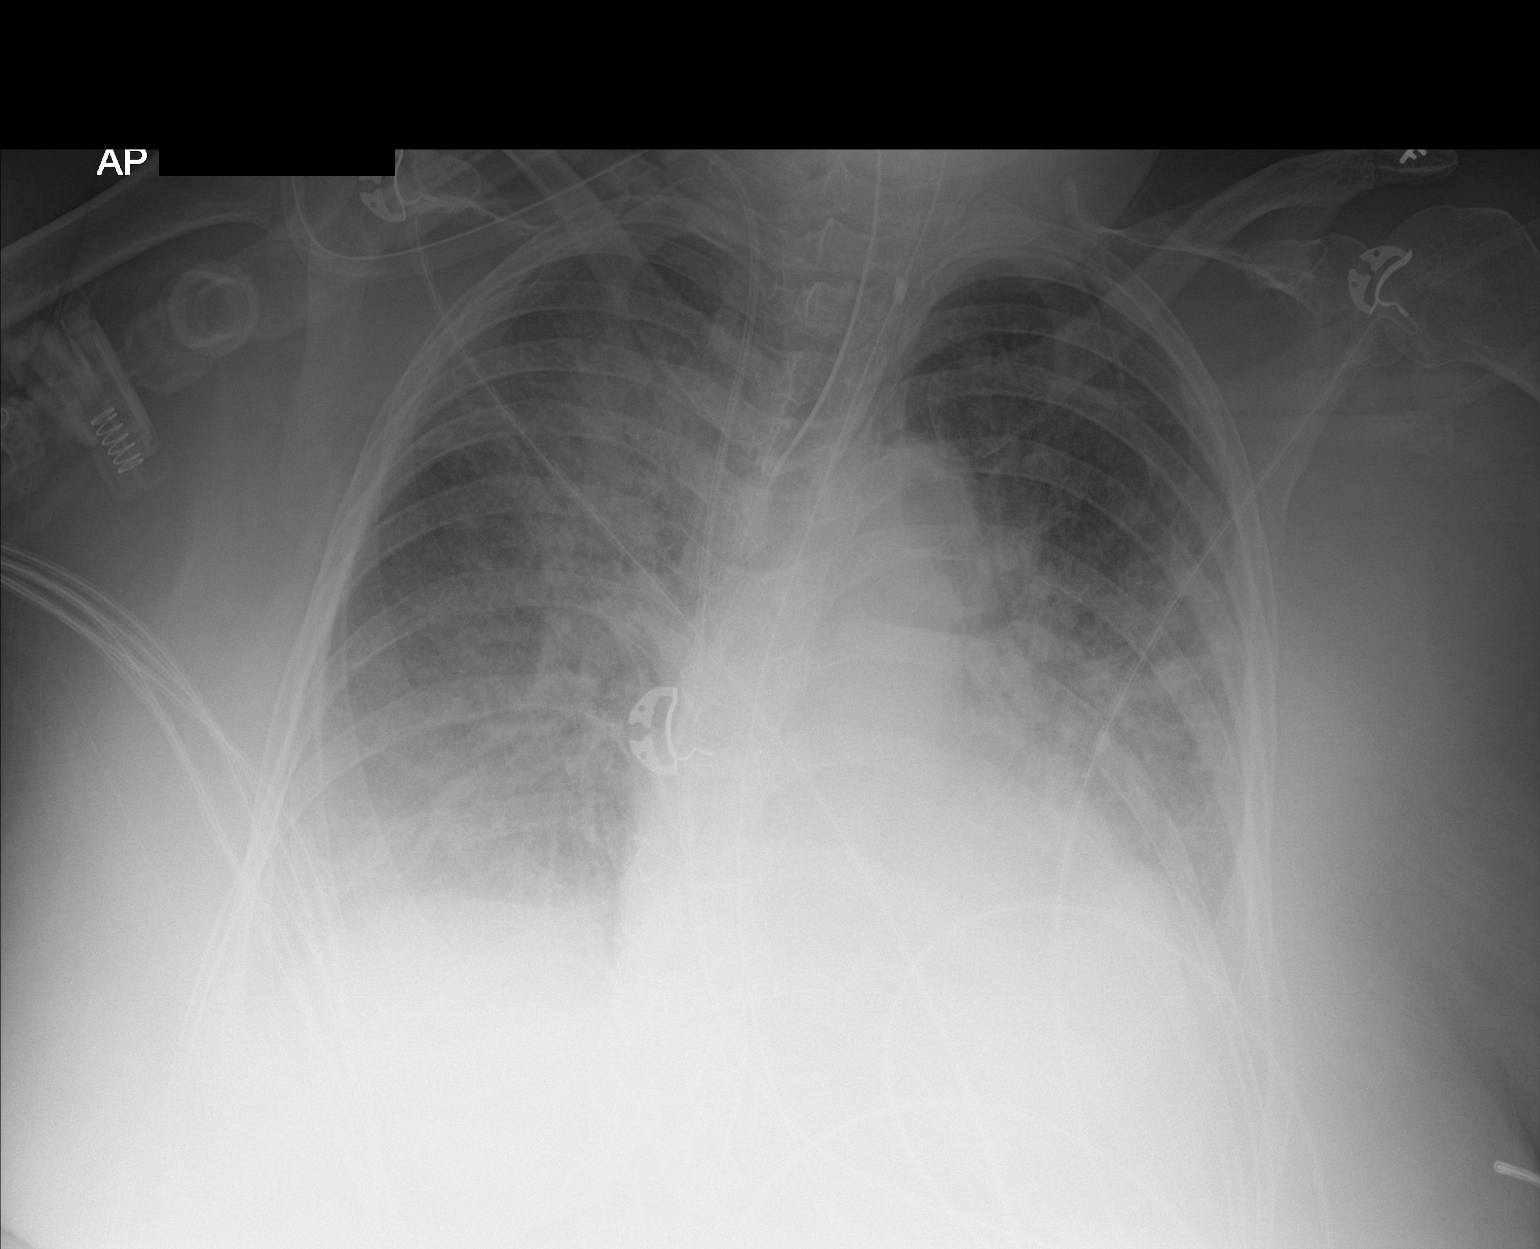

[1 of 1 positions shown; findings below may reference images not displayed]

FINDINGS: The lungs are adequately inflated. The interstitial markings remain
increased with areas of alveolar opacity noted in the right upper
and left lobe mid and lower lung. There is some blunting of the
costophrenic angles with obscuration of the left hemidiaphragm. The
cardiac silhouette is top-normal in size. The pulmonary vascularity
is indistinct. The endotracheal tube tip lies approximately 1.9 cm
above the carina. The esophagogastric tube tip projects below the
inferior margin of the image. The right internal jugular venous
catheter tip projects over the midportion of the SVC.
IMPRESSION: Persistent bilateral alveolar opacities compatible with pneumonia.
Probable small bilateral pleural effusions. There may be associated
CHF but new bone is the predominant abnormality present. The support
tubes are in reasonable position.

## 2017-05-19 IMAGING — DX DG CHEST 1V
1 series · 1 of 1 positions shown · non-contrast
Comparison: 01/27/2016

CLINICAL DATA: Sepsis, difficulty breathing

EXAM:
CHEST 1 VIEW

[chest ap]
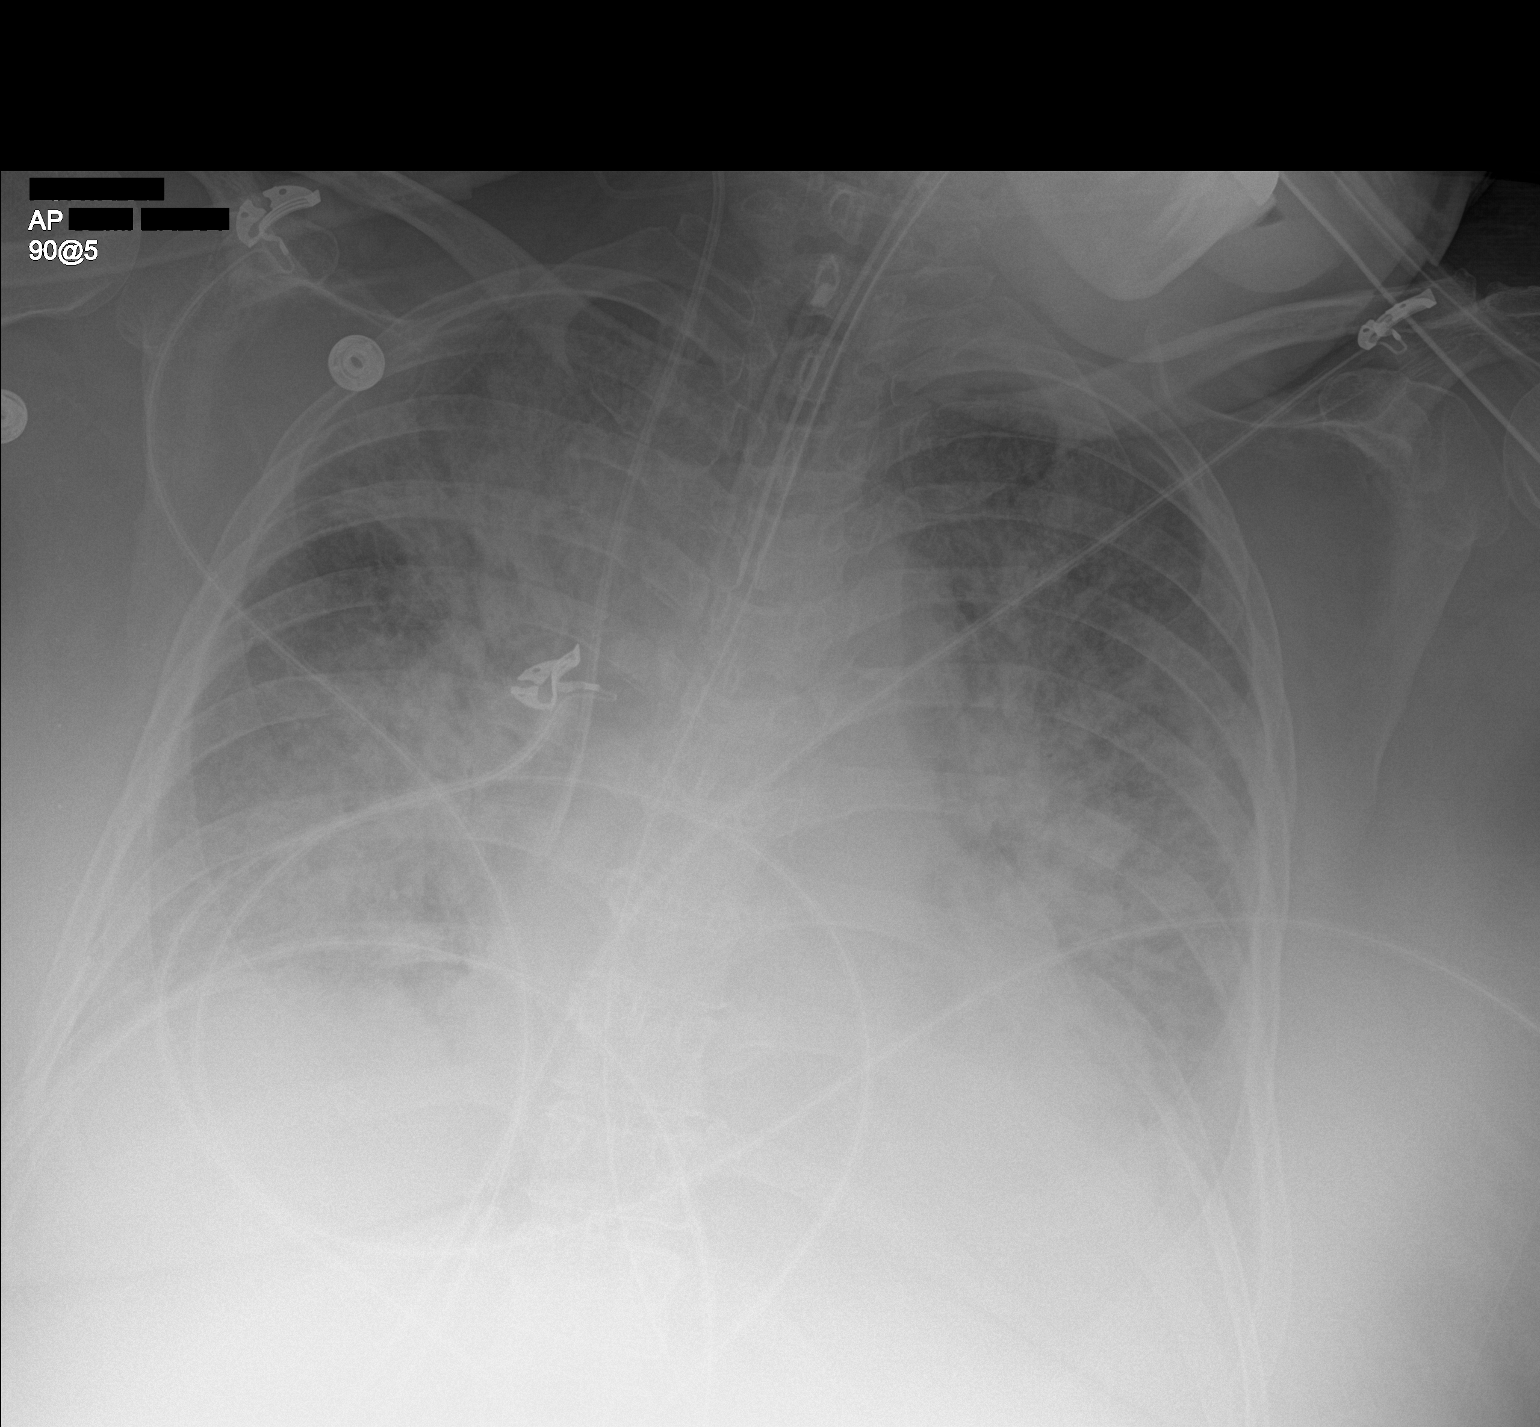

[1 of 1 positions shown; findings below may reference images not displayed]

FINDINGS: Cardiac shadow is stable. An endotracheal tube, nasogastric catheter
and right jugular central line are again seen and stable. Increasing
bilateral infiltrates are noted when compare with the prior exam.
This is most marked in the right upper lobe. There may be some
superimposed congestive failure present as well.
IMPRESSION: Significant increase in bilateral infiltrative changes. A portion of
this may be related to vascular congestion.
